# Patient Record
Sex: Female | Born: 1945 | Race: White | Hispanic: No | Marital: Married | Smoking: Never smoker
Health system: Southern US, Community
[De-identification: ages and names within clinical notes are randomized; demographics above are authoritative.]

## PROBLEM LIST (undated history)

## (undated) DIAGNOSIS — E785 Hyperlipidemia, unspecified: Secondary | ICD-10-CM

## (undated) DIAGNOSIS — L719 Rosacea, unspecified: Secondary | ICD-10-CM

## (undated) DIAGNOSIS — E042 Nontoxic multinodular goiter: Secondary | ICD-10-CM

## (undated) DIAGNOSIS — C801 Malignant (primary) neoplasm, unspecified: Secondary | ICD-10-CM

## (undated) DIAGNOSIS — T7840XA Allergy, unspecified, initial encounter: Secondary | ICD-10-CM

## (undated) DIAGNOSIS — L438 Other lichen planus: Secondary | ICD-10-CM

## (undated) DIAGNOSIS — Z0000 Encounter for general adult medical examination without abnormal findings: Secondary | ICD-10-CM

## (undated) DIAGNOSIS — R011 Cardiac murmur, unspecified: Secondary | ICD-10-CM

## (undated) DIAGNOSIS — C44310 Basal cell carcinoma of skin of unspecified parts of face: Secondary | ICD-10-CM

## (undated) DIAGNOSIS — I38 Endocarditis, valve unspecified: Secondary | ICD-10-CM

## (undated) DIAGNOSIS — Z8673 Personal history of transient ischemic attack (TIA), and cerebral infarction without residual deficits: Secondary | ICD-10-CM

## (undated) HISTORY — PX: CARDIAC VALVE REPLACEMENT: SHX585

## (undated) HISTORY — DX: Hyperlipidemia, unspecified: E78.5

## (undated) HISTORY — DX: Other lichen planus: L43.8

## (undated) HISTORY — DX: Basal cell carcinoma of skin of unspecified parts of face: C44.310

## (undated) HISTORY — PX: HERNIA REPAIR: SHX51

## (undated) HISTORY — DX: Malignant (primary) neoplasm, unspecified: C80.1

## (undated) HISTORY — DX: Allergy, unspecified, initial encounter: T78.40XA

## (undated) HISTORY — DX: Endocarditis, valve unspecified: I38

## (undated) HISTORY — DX: Personal history of transient ischemic attack (TIA), and cerebral infarction without residual deficits: Z86.73

## (undated) HISTORY — DX: Rosacea, unspecified: L71.9

## (undated) HISTORY — PX: PACEMAKER PLACEMENT: SHX43

## (undated) HISTORY — DX: Encounter for general adult medical examination without abnormal findings: Z00.00

## (undated) HISTORY — DX: Nontoxic multinodular goiter: E04.2

## (undated) HISTORY — DX: Cardiac murmur, unspecified: R01.1

## (undated) HISTORY — PX: MOHS SURGERY: SHX181

---

## 1999-02-17 ENCOUNTER — Encounter: Payer: Self-pay | Admitting: Family Medicine

## 1999-02-17 ENCOUNTER — Ambulatory Visit (HOSPITAL_COMMUNITY): Admission: RE | Admit: 1999-02-17 | Discharge: 1999-02-17 | Payer: Self-pay | Admitting: Family Medicine

## 2007-08-07 HISTORY — PX: ABDOMINAL HYSTERECTOMY: SHX81

## 2007-08-07 HISTORY — PX: COLECTOMY: SHX59

## 2010-05-24 ENCOUNTER — Emergency Department (HOSPITAL_BASED_OUTPATIENT_CLINIC_OR_DEPARTMENT_OTHER): Admission: EM | Admit: 2010-05-24 | Discharge: 2010-05-24 | Payer: Self-pay | Admitting: Emergency Medicine

## 2010-05-24 ENCOUNTER — Ambulatory Visit: Payer: Self-pay | Admitting: Diagnostic Radiology

## 2010-06-06 HISTORY — PX: VENTRAL HERNIA REPAIR: SHX424

## 2010-07-06 LAB — CONVERTED CEMR LAB

## 2010-09-01 ENCOUNTER — Encounter: Payer: Self-pay | Admitting: Family Medicine

## 2010-09-01 ENCOUNTER — Ambulatory Visit
Admission: RE | Admit: 2010-09-01 | Discharge: 2010-09-01 | Payer: Self-pay | Source: Home / Self Care | Attending: Family Medicine | Admitting: Family Medicine

## 2010-09-01 DIAGNOSIS — Z8679 Personal history of other diseases of the circulatory system: Secondary | ICD-10-CM | POA: Insufficient documentation

## 2010-09-01 DIAGNOSIS — I776 Arteritis, unspecified: Secondary | ICD-10-CM | POA: Insufficient documentation

## 2010-09-01 DIAGNOSIS — Z85038 Personal history of other malignant neoplasm of large intestine: Secondary | ICD-10-CM | POA: Insufficient documentation

## 2010-09-01 DIAGNOSIS — K439 Ventral hernia without obstruction or gangrene: Secondary | ICD-10-CM | POA: Insufficient documentation

## 2010-09-01 DIAGNOSIS — E663 Overweight: Secondary | ICD-10-CM | POA: Insufficient documentation

## 2010-09-01 DIAGNOSIS — C189 Malignant neoplasm of colon, unspecified: Secondary | ICD-10-CM | POA: Insufficient documentation

## 2010-09-01 DIAGNOSIS — N83209 Unspecified ovarian cyst, unspecified side: Secondary | ICD-10-CM | POA: Insufficient documentation

## 2010-09-01 DIAGNOSIS — I10 Essential (primary) hypertension: Secondary | ICD-10-CM | POA: Insufficient documentation

## 2010-09-01 DIAGNOSIS — M109 Gout, unspecified: Secondary | ICD-10-CM | POA: Insufficient documentation

## 2010-09-01 DIAGNOSIS — R209 Unspecified disturbances of skin sensation: Secondary | ICD-10-CM | POA: Insufficient documentation

## 2010-09-01 DIAGNOSIS — Z9189 Other specified personal risk factors, not elsewhere classified: Secondary | ICD-10-CM | POA: Insufficient documentation

## 2010-09-01 DIAGNOSIS — E1149 Type 2 diabetes mellitus with other diabetic neurological complication: Secondary | ICD-10-CM | POA: Insufficient documentation

## 2010-09-04 ENCOUNTER — Other Ambulatory Visit: Payer: Self-pay | Admitting: Family Medicine

## 2010-09-04 DIAGNOSIS — Z79899 Other long term (current) drug therapy: Secondary | ICD-10-CM

## 2010-09-04 DIAGNOSIS — Z780 Asymptomatic menopausal state: Secondary | ICD-10-CM

## 2010-09-07 ENCOUNTER — Telehealth: Payer: Self-pay | Admitting: Family Medicine

## 2010-09-11 ENCOUNTER — Encounter: Payer: Self-pay | Admitting: Family Medicine

## 2010-09-12 LAB — CONVERTED CEMR LAB
Albumin: 4.5 g/dL (ref 3.5–5.2)
CO2: 26 meq/L (ref 19–32)
Calcium: 9.9 mg/dL (ref 8.4–10.5)
Creatinine, Ser: 0.67 mg/dL (ref 0.40–1.20)
Folate: >20 ng/mL
HCT: 41.4 % (ref 36.0–46.0)
Indirect Bilirubin: 0.3 mg/dL (ref 0.0–0.9)
Iron: 58 ug/dL (ref 42–145)
MCHC: 32.1 g/dL (ref 30.0–36.0)
MCV: 84.3 fL (ref 78.0–100.0)
Platelets: 253 10*3/uL (ref 150–400)
Sodium: 142 meq/L (ref 135–145)
Total Bilirubin: 0.4 mg/dL (ref 0.3–1.2)
Total Protein: 7.3 g/dL (ref 6.0–8.3)
Vitamin B-12: 253 pg/mL (ref 211–911)
WBC: 7.9 10*3/uL (ref 4.0–10.5)

## 2010-09-13 ENCOUNTER — Ambulatory Visit
Admission: RE | Admit: 2010-09-13 | Discharge: 2010-09-13 | Disposition: A | Discharge status: Home / Self Care | Payer: Medicare Other | Source: Ambulatory Visit | Attending: Family Medicine | Admitting: Family Medicine

## 2010-09-13 DIAGNOSIS — Z780 Asymptomatic menopausal state: Secondary | ICD-10-CM

## 2010-09-13 DIAGNOSIS — Z79899 Other long term (current) drug therapy: Secondary | ICD-10-CM

## 2010-09-13 NOTE — Assessment & Plan Note (Signed)
Summary: MEDICARE WELLNESS CHECK/VFW   Vital Signs:  Patient profile:   65 year old female Menstrual status:  hysterectomy Height:      61 inches (154.94 cm) Weight:      167.75 pounds (76.25 kg) BMI:     31.81 O2 Sat:      99 % on Room air Temp:     97.8 degrees F (36.56 degrees C) oral Pulse rate:   82 / minute BP sitting:   145 / 82  (right arm)  Vitals Entered By: Josph Macho RMA (September 01, 2010 1:23 PM)  O2 Flow:  Room air  Serial Vital Signs/Assessments:  Time      Position  BP       Pulse  Resp  Temp     By                     132/82                         Danise Edge MD  CC: Establish new patient/ Medicare wellness/ CF Is Patient Diabetic? Yes     Menstrual Status hysterectomy Last PAP Result historical   History of Present Illness: she is a 65 year old Caucasian female in today to establish care. She is uneventful last couple of years with the diagnosis of colon cancer a in retrospect she had had some mild bowel changes and he developed lower quadrant pain after workup she was found to have a 10 pound ovarian cyst that was noncancerous and colon cancer as well. She was kept in [facility] for week and ultimately underwent surgery which resected both. She had a total hysterectomy as well as a partial colectomy although she is unclear how much colon was taken. Her complication process was complicated by abdominal wall hernias resulting in some bowel obstructions and pain. In November of 2011 check she went through hernia repair was done for hernias there is a very large piece of mesh covering her abdominal wall at this time. at this time her bowels are moving comfortably no bloody or tarry stool. She denies abdominal pain or any urinary complaints. In July of this year she also had an episode shingles on her face covered in her right jaw line. Was treated with Valtrex and the symptoms resolved about 3 weeks ago she started to notice some mild perioral numbness  says it comes and goes he has some similar symptoms during some of her chemotherapy treatments. Denies any numbness in the mouth any changes in diet recent illness, fevers, chills, nausea vomiting or excessive exercise. She does walk daily and carefully watches her diet. She has been in Weight Watchers since he hasn't had dropped from a high of 235 now down to 167 pounds and 12 ounces. Last hemoglobin A1c was 6.6 she follows with endocrinology who manages her labs. No recent illness, f, c, n, v, cong, CP, palp, SOB, GU c/o.   Preventive Screening-Counseling & Management  Alcohol-Tobacco     Smoking Status: never  Caffeine-Diet-Exercise     Does Patient Exercise: yes      Drug Use:  no.    Current Problems (verified): 1)  Arrhythmia, Hx of  (ICD-V12.50) 2)  Abdominal Wall Hernia  (ICD-553.20) 3)  Overweight  (ICD-278.02) 4)  Other and Unspecified Ovarian Cyst  (ICD-620.2) 5)  Essential Hypertension, Benign  (ICD-401.1) 6)  Colon Cancer  (ICD-153.9) 7)  Dm  (ICD-250.00) 8)  Vasculitis  (ICD-447.6)  Current Medications (verified): 1)  Metformin Hcl 500 Mg Tabs (Metformin Hcl) .Marland Kitchen.. 1 Three Times A Day 2)  Allopurinol 100 Mg Tabs (Allopurinol) .... Once Daily 3)  Hydrochlorothiazide 25 Mg Tabs (Hydrochlorothiazide) .... Once Daily 4)  Accupril 10 Mg Tabs (Quinapril Hcl) .... Once Daily 5)  Crestor 10 Mg Tabs (Rosuvastatin Calcium) .... 3 Per Week 6)  Aspir-Low 81 Mg Tbec (Aspirin) .... 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  The patient denies anorexia, fever, weight gain, vision loss, decreased hearing, hoarseness, chest pain, syncope, dyspnea on exertion, peripheral edema, prolonged cough, headaches, hemoptysis, abdominal pain, melena, hematochezia, severe indigestion/heartburn, hematuria, incontinence, genital sores, muscle weakness, suspicious skin lesions, transient blindness, difficulty walking, depression, unusual weight change, and enlarged lymph nodes.         Started Weight Watchers in 2010 at a hi of 235 pounds Other Physicians: Dr Talmage Nap @ [facility], Endocrinologist Dr Liana Crocker @ [facility], Oncology Dr Raeanne Barry @ [facility], Gastroenterology, colonoscopies, last done 2011, clear, repeat in 3 years Dr Noland Fordyce @ [facility], Gynecology Dr Lanice Schwab @ [facility], General Surgery Dr Junius Roads @ [facility], Dermatologist [facility] does her annual diabetic eye exam and she she reports it has been done in past year and it was WNL.  Patient denies any vision or hearing concerns or changes Patient denies any depression, anhedonia or hopelessness, no h/o same No falls and no difficulty with any ADLs including personal care, bills or cooking Patient denies any pain, does not feel unsafe. No urinary incontinence, no dietary restrictions Patient has recently filled out wills and advanced directives with her family and will bring them in to next visit. Takes medications as prescribed, does not take OTC meds No memory loss, no hazardous areas in home  Physical Exam  General:  Well-developed,well-nourished,in no acute distress;  alert,appropriate and cooperative throughout examination Head:  Normocephalic and atraumatic without obvious abnormalities. No apparent alopecia or balding. Eyes:  No corneal or conjunctival inflammation noted. EOMI. Perrla.  Ears:  External ear exam shows no significant lesions or deformities.  Otoscopic examination reveals clear canals, tympanic membranes are intact bilaterally without bulging, retraction, inflammation or discharge. Hearing is grossly normal bilaterally. Nose:  External nasal examination shows no deformity or inflammation. Nasal mucosa are pink and moist without lesions or exudates. Mouth:  Oral mucosa and oropharynx without lesions or exudates.  Teeth in good repair. Neck:  No deformities, masses, or tenderness noted. Lungs:  Normal respiratory effort, chest expands symmetrically. Lungs are clear to auscultation, no crackles or wheezes. Heart:  Normal rate and regular rhythm. S1 and S2 normal without gallop, murmur, click, rub or other extra sounds. Abdomen:  Bowel sounds positive,abdomen soft and non-tender without masses, organomegaly or hernias noted.  Extensive  vertical midline scar and numerous other small scars scattered throughout Msk:  No deformity or scoliosis noted of thoracic or lumbar spine.   Pulses:  R and L carotid,radial,dorsalis pedis and posterior tibial pulses are full and equal bilaterally Extremities:  No clubbing, cyanosis, edema, or deformity noted with normal full range of motion of all joints.   Neurologic:  No cranial nerve deficits noted. Station and gait are normal. Plantar reflexes are down-going bilaterally. DTRs are symmetrical throughout. Sensory, motor and coordinative functions appear intact. Skin:  Intact without suspicious lesions or rashes Cervical Nodes:  No lymphadenopathy noted Psych:  Cognition and judgment appear intact. Alert and cooperative with normal attention span and concentration. No apparent delusions, illusions,  hallucinations   Impression & Recommendations:  Problem # 1:  PREVENTIVE HEALTH CARE (ICD-V70.0)  Welcome to Medicare exam completed today. Patient agrees to Bone Densitometry, we will hold on carotid dopplers and AAA screen until her recent cardiac work up has been forwarded to [place] because she is unsure what has already been done. She will bring in copies of her Advanced Directives paperwork to her next visit for inclusion in her chart  Orders: Welcome to [employer], Physical [phone])  Problem # 2:  ESSENTIAL HYPERTENSION, BENIGN (ICD-401.1)  Her updated medication list for this problem includes:    Hydrochlorothiazide 25 Mg Tabs (Hydrochlorothiazide) ..... Once daily    Accupril 10 Mg Tabs (Quinapril hcl) ..... Once daily Improved control on repeat no change in therapy at today's visit. If she begins to experience difficulty with her gout we may need to d/c HCTZ  Problem # 3:  FACIAL PARESTHESIA (ICD-782.0) Unclear etiology will request lab work from other offices and patient will start Vitamin B complex and notify us if symptoms worsen  Problem # 4:  GOUT, UNSPECIFIED (ICD-274.9)  Her updated medication list for this problem includes:    Allopurinol 100 Mg Tabs (Allopurinol) ..... Once daily No flares in several years. maintain adequate hydration and avoid offending foods, await labs to evaluate uric acid level  Problem # 5:  ARRHYTHMIA, HX OF (ICD-V12.50) Normal rhythm today, requesting records from cardiac work up  Problem # 6:  DM (ICD-250.00)  Her updated medication list for this problem includes:    Metformin Hcl 500 Mg Tabs (Metformin hcl) .Marland Kitchen... 1 three times a day    Accupril 10 Mg Tabs (Quinapril hcl) ..... Once daily    Aspir-low 81 Mg Tbec (Aspirin) ..... Once daily HGBA1C of 6.6 and 6.3 over paset 2 blood draws per patient, will obtain records to help monitor with patient.  Problem # 7:  OVERWEIGHT (ICD-278.02) Will continue with weight watchers and maintain activity  level. Encouraged to avoid all trans fats   Complete Medication List: 1)  Metformin Hcl 500 Mg Tabs (Metformin hcl) .Marland Kitchen.. 1 three times a day 2)  Allopurinol 100 Mg Tabs (Allopurinol) .... Once daily 3)  Hydrochlorothiazide 25 Mg Tabs (Hydrochlorothiazide) .... Once daily 4)  Accupril 10 Mg Tabs (Quinapril hcl) .... Once daily 5)  Crestor 10 Mg Tabs (Rosuvastatin calcium) .... 3 per week 6)  Aspir-low 81 Mg Tbec (Aspirin) .... Once daily  Other Orders: Radiology Referral (Radiology)  Patient Instructions: 1)  Please schedule a follow-up appointment in 1 month.  2)  Release of records Dr Talmage Nap 2009 to present 3)  Start a Vitamin B complex 4)  Consider a Tdap and Zostavax at next visit 5)  Minimize sodium  6)  Consider supplying [place] with copies of DNR and  advanced directives   Orders Added: 1)  Welcome to Medicare, Physical [G0402] 2)  Radiology Referral [Radiology] 3)  New Patient Level III [04540]    Preventive Care Screening  Mammogram:    Date:  08/06/2010    Results:  historical   Pap Smear:    Date:  07/06/2010    Results:  historical   Last Pneumovax:    Date:  04/06/2010    Results:  historical   Last Flu Shot:    Date:  04/06/2010    Results:  historical   Colonoscopy:    Date:  08/07/2007    Results:  historical

## 2010-09-14 ENCOUNTER — Encounter: Payer: Self-pay | Admitting: *Deleted

## 2010-09-21 NOTE — Progress Notes (Signed)
Summary: Vitamin test  Phone Note Call from Patient Call back at Home Phone [phone]   Summary of Call: pt is requesting to have vitamin deficiency test to be done, she does not want to wait until her March appt Initial call taken by: Lannette Donath,  September 07, 2010 2:59 PM  Follow-up for Phone Call        can runvitamin d level, vitamin b12 level, folic acid and thiamine levels for paresthesias. Can run a renal and CBC for DM. Where does she want to do it? Follow-up by: Danise Edge MD,  September 08, 2010 1:05 PM  Additional Follow-up for Phone Call Additional follow up Details #1::        Left message with spouse to have pt return my call Additional Follow-up by: Josph Macho RMA,  September 08, 2010 4:20 PM    Additional Follow-up for Phone Call Additional follow up Details #2::    Patient would like to have her labs done at the [facility] [address]. pt would also like to have zinc and magnesium checked Follow-up by: Josph Macho RMA,  September 08, 2010 4:31 PM  Additional Follow-up for Phone Call Additional follow up Details #3:: Details for Additional Follow-up Action Taken: Labs are orderedd Additional Follow-up by: Danise Edge MD,  September 11, 2010 8:23 AM

## 2010-10-13 ENCOUNTER — Ambulatory Visit: Payer: Self-pay | Admitting: Family Medicine

## 2010-10-18 ENCOUNTER — Ambulatory Visit (INDEPENDENT_AMBULATORY_CARE_PROVIDER_SITE_OTHER): Payer: Medicare Other | Admitting: Family Medicine

## 2010-10-18 ENCOUNTER — Encounter: Payer: Self-pay | Admitting: Family Medicine

## 2010-10-18 DIAGNOSIS — E119 Type 2 diabetes mellitus without complications: Secondary | ICD-10-CM

## 2010-10-18 DIAGNOSIS — M109 Gout, unspecified: Secondary | ICD-10-CM

## 2010-10-18 DIAGNOSIS — I10 Essential (primary) hypertension: Secondary | ICD-10-CM

## 2010-10-18 DIAGNOSIS — R209 Unspecified disturbances of skin sensation: Secondary | ICD-10-CM

## 2010-10-18 LAB — POCT CARDIAC MARKERS
CKMB, poc: 2.6 ng/mL (ref 1.0–8.0)
Myoglobin, poc: 87.2 ng/mL (ref 12–200)

## 2010-10-18 LAB — URINE MICROSCOPIC-ADD ON

## 2010-10-18 LAB — COMPREHENSIVE METABOLIC PANEL
BUN: 20 mg/dL (ref 6–23)
CO2: 29 mEq/L (ref 19–32)
Calcium: 9.5 mg/dL (ref 8.4–10.5)
Creatinine, Ser: 0.8 mg/dL (ref 0.4–1.2)
GFR calc non Af Amer: >60 mL/min (ref >60)
Glucose, Bld: 128 mg/dL — ABNORMAL HIGH (ref 70–99)
Total Protein: 8.6 g/dL — ABNORMAL HIGH (ref 6.0–8.3)

## 2010-10-18 LAB — URINALYSIS, ROUTINE W REFLEX MICROSCOPIC
Bilirubin Urine: NEGATIVE
Glucose, UA: NEGATIVE mg/dL
Ketones, ur: 15 mg/dL — AB
Leukocytes, UA: NEGATIVE
Nitrite: NEGATIVE
Protein, ur: NEGATIVE mg/dL
Specific Gravity, Urine: 1.026 (ref 1.005–1.030)
Urobilinogen, UA: 1 mg/dL (ref 0.0–1.0)
pH: 6 (ref 5.0–8.0)

## 2010-10-18 LAB — DIFFERENTIAL
Eosinophils Absolute: 0 10*3/uL (ref 0.0–0.7)
Lymphs Abs: 2 10*3/uL (ref 0.7–4.0)
Monocytes Relative: 4 % (ref 3–12)
Neutro Abs: 8.9 10*3/uL — ABNORMAL HIGH (ref 1.7–7.7)
Neutrophils Relative %: 78 % — ABNORMAL HIGH (ref 43–77)

## 2010-10-18 LAB — CBC
HCT: 45.9 % (ref 36.0–46.0)
Hemoglobin: 15.9 g/dL — ABNORMAL HIGH (ref 12.0–15.0)
MCH: 30.1 pg (ref 26.0–34.0)
MCHC: 34.7 g/dL (ref 30.0–36.0)
MCV: 86.9 fL (ref 78.0–100.0)
RDW: 13.4 % (ref 11.5–15.5)

## 2010-10-18 LAB — URINE CULTURE: Colony Count: 45000

## 2010-10-18 LAB — LIPASE, BLOOD: Lipase: 43 U/L (ref 23–300)

## 2010-10-19 ENCOUNTER — Telehealth (INDEPENDENT_AMBULATORY_CARE_PROVIDER_SITE_OTHER): Payer: Self-pay | Admitting: *Deleted

## 2010-10-22 ENCOUNTER — Encounter: Payer: Self-pay | Admitting: Family Medicine

## 2010-10-24 NOTE — Assessment & Plan Note (Signed)
Summary: f/u 1 month appt/with dr. Abner Greenspan   Vital Signs:  Patient profile:   65 year old female Menstrual status:  hysterectomy Height:      61 inches (154.94 cm) Weight:      166.50 pounds (75.68 kg) O2 Sat:      99 % on Room air Temp:     97.9 degrees F (36.61 degrees C) oral Pulse rate:   65 / minute BP sitting:   129 / 76  (right arm)  Vitals Entered By: Josph Macho RMA (October 18, 2010 12:58 PM)  O2 Flow:  Room air  Serial Vital Signs/Assessments:  Time      Position  BP       Pulse  Resp  Temp     By                     118/78                         Danise Edge MD  CC: 1 month follow up/ CF Is Patient Diabetic? Yes   History of Present Illness:  patient is a 65 year old Caucasian female who is in today for followup. she was seen as a new patient her last visit and was struggling with some decreased taste some perioral numbness tingling discomfort she was started on a B. vitamin and that did help some. Then after reading some more on the internet she decided to stop her metformin 2 weeks ago. She reports stopping the metformin has helped her symptoms significantly and she is much less uncomfortable. She does see an endocrinologist had some labs on him and is restarted at 22 units each bedtime and her blood sugars in the morning have ranged from 98-110. As she has stayed off the Metformin, no other acute or recent complaints. No recent illness, fevers, chills, chest pain, palpitations, shortness of breath for numbness and tingling continue improving her taste has improved as well.  Current Medications (verified): 1)  Metformin Hcl 500 Mg Tabs (Metformin Hcl) .Marland Kitchen.. 1 Three Times A Day 2)  Allopurinol 100 Mg Tabs (Allopurinol) .... Once Daily 3)  Hydrochlorothiazide 25 Mg Tabs (Hydrochlorothiazide) .... Once Daily 4)  Accupril 10 Mg Tabs (Quinapril Hcl) .... Once Daily 5)  Crestor 10 Mg Tabs (Rosuvastatin Calcium) .... 3 Per Week 6)  Aspir-Low 81 Mg Tbec (Aspirin) .... Once  Daily  Allergies (verified): No Known Drug Allergies  Past History:  Past medical history reviewed for relevance to current acute and chronic problems. Social history (including risk factors) reviewed for relevance to current acute and chronic problems.  Social History: Reviewed history from 09/01/2010 and no changes required. 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 No apparent alopecia or balding. Mouth:  Oral mucosa and oropharynx without lesions or exudates.  Teeth in good repair. Neck:  No deformities, masses, or tenderness noted. Lungs:  Normal respiratory effort, chest expands symmetrically. Lungs are clear to auscultation, no crackles or wheezes. Heart:  Normal rate and regular rhythm. S1 and S2 normal without gallop, murmur, click, rub or other extra sounds. Abdomen:  Bowel sounds positive,abdomen soft and non-tender without masses, organomegaly or  hernias noted. Extremities:  No clubbing, cyanosis, edema, or deformity noted with normal full range of motion of all joints.   Psych:  Cognition and judgment appear intact. Alert and cooperative with normal attention span and concentration. No apparent delusions, illusions, hallucinations   Impression & Recommendations:  Problem # 1:  FACIAL PARESTHESIA (ICD-782.0) Improving off of Metformin and on Vitamin B1 will continue same for now and patient will call if symptoms worsen again for further evaluation and possible referral to Neurology  Problem # 2:  ESSENTIAL HYPERTENSION, BENIGN (ICD-401.1)  Her updated medication list for this problem  includes:    Hydrochlorothiazide 25 Mg Tabs (Hydrochlorothiazide) ..... Once daily    Accupril 10 Mg Tabs (Quinapril hcl) ..... Once daily Well controlled no changes  Problem # 3:  DM (ICD-250.00)  The following medications were removed from the medication list:    Metformin Hcl 500 Mg Tabs (Metformin hcl) .Marland Kitchen... 1 three times a day Her updated medication list for this problem includes:    Accupril 10 Mg Tabs (Quinapril hcl) ..... Once daily    Aspir-low 81 Mg Tbec (Aspirin) ..... Once daily    Lantus Solostar 100 Unit/ml Soln (Insulin glargine) .Marland Kitchen... 22 units subcutaneously at bedtime Patient reports improvement in sensation of taste and decreased oral tingling and numbness since stopping Metformin and blood sugars in am ranging from 98 to 110. Will cont Lantus at same dosing until she sees her Endocrinologist again  Problem # 4:  GOUT, UNSPECIFIED (ICD-274.9)  Her updated medication list for this problem includes:    Allopurinol 100 Mg Tabs (Allopurinol) ..... Once daily Patient has not had a flare since she was acutely ill. She may try and titrate off Allopurinol and maintain adequate hydration and see if she has any flares  Complete Medication List: 1)  Allopurinol 100 Mg Tabs (Allopurinol) .... Once daily 2)  Hydrochlorothiazide 25 Mg Tabs (Hydrochlorothiazide) .... Once daily 3)  Accupril 10 Mg Tabs (Quinapril hcl) .... Once daily 4)  Crestor 10 Mg Tabs (Rosuvastatin calcium) .... 3 per week 5)  Aspir-low 81 Mg Tbec (Aspirin) .... Once daily 6)  Lantus Solostar 100 Unit/ml Soln (Insulin glargine) .... 22 units subcutaneously at bedtime  Patient Instructions: 1)  Release of Records [facility] Dr Sharlynn Oliphant? October 2011 all notes, testing and labs 2)  Release of Records Dr Talmage Nap, B 3)  Release of Records, [facility], Dr Wilburn Mylar 4)  Release of Records, [facility] 5)  Please schedule a follow-up appointment in 6 to 8  months .  6)  64 oz clear fluids, extra  if drinking caffeine   Orders Added: 1)  Est. Patient Level IV [11914]

## 2010-10-24 NOTE — Letter (Signed)
Summary: Records Dated 1-10 thru 1-12/[facility]  Records Dated 1-10 thru 1-12/[facility]   Imported By: Lanelle Bal 10/18/2010 13:56:21  _____________________________________________________________________  External Attachment:    Type:   Image     Comment:   External Document

## 2010-10-24 NOTE — Progress Notes (Signed)
  Phone Note From Other Clinic   Summary of Call: Andrea Calderon is calling stating they need an ICD9 code for Vitamin D, iron, folic serum test? Archie [phone] Initial call taken by: Josph Macho RMA,  October 19, 2010 8:11 AM  Follow-up for Phone Call        colon cancer for vitamin d and anemia, 153.9,  Follow-up by: Danise Edge MD,  October 19, 2010 12:37 PM  Additional Follow-up for Phone Call Additional follow up Details #1::        Informed [facility] Additional Follow-up by: Josph Macho RMA,  October 20, 2010 10:16 AM

## 2010-12-20 ENCOUNTER — Encounter: Payer: Self-pay | Admitting: Family Medicine

## 2011-09-10 ENCOUNTER — Encounter: Payer: Self-pay | Admitting: Family Medicine

## 2011-09-10 ENCOUNTER — Ambulatory Visit (INDEPENDENT_AMBULATORY_CARE_PROVIDER_SITE_OTHER): Payer: Medicare Other | Admitting: Family Medicine

## 2011-09-10 DIAGNOSIS — L280 Lichen simplex chronicus: Secondary | ICD-10-CM

## 2011-09-10 DIAGNOSIS — I10 Essential (primary) hypertension: Secondary | ICD-10-CM

## 2011-09-10 DIAGNOSIS — I639 Cerebral infarction, unspecified: Secondary | ICD-10-CM

## 2011-09-10 DIAGNOSIS — L438 Other lichen planus: Secondary | ICD-10-CM

## 2011-09-10 DIAGNOSIS — I6340 Cerebral infarction due to embolism of unspecified cerebral artery: Secondary | ICD-10-CM

## 2011-09-10 DIAGNOSIS — I6330 Cerebral infarction due to thrombosis of unspecified cerebral artery: Secondary | ICD-10-CM

## 2011-09-10 NOTE — Patient Instructions (Addendum)
Stroke (Cerebrovascular Accident) A stroke is the sudden death of brain tissue. It is a medical emergency. A stroke can cause permanent loss of brain function. This can cause problems with different parts of your body. A TIA (transient ischemic attack) is different because it does not cause permanent damage. A TIA is a short-lived problem of poor blood flow affecting a part of the nervous system. TIA is also a serious problem because having a TIA greatly increases the chances of having a stroke. When symptoms first develop, you cannot know if the problem might be a stroke or TIA. CAUSES  A stroke is caused by a decrease of oxygen supply to an area of your brain. It is usually the result of a small blood clot or the arteries hardening. A stroke can also be caused by blocked or damaged carotid arteries. Bleeding in the brain can cause, or accompany, a stroke. RISK FACTORS  High blood pressure (hypertension).   High cholesterol.   Diabetes.   Heart disease.   The buildup of fatty deposits in the blood vessels (peripheral artery disease or atherosclerosis).   An abnormal heart rhythm (atrial fibrillation).   Obesity.   Smoking.   Taking oral contraceptives (especially in combination with smoking).   Physical inactivity.   A diet high in fats, salt (sodium), and calories.   Alcohol use.   Use of illegal drugs (especially cocaine and methamphetamine).   Being a female.   Being an African American.   Age over 55.   Family history of stroke.   Previous history of blood clots, a "warning stroke" (transient ischemic attack, TIA), or heart attack.   Sickle cell disease.  SYMPTOMS  These symptoms usually develop suddenly (or may be newly present upon awakening from sleep):  Sudden weakness or numbness of the face, arm, or leg, especially on one side of the body.   Sudden confusion.   Trouble speaking (aphasia) or understanding.   Sudden trouble seeing in one or both eyes.    Sudden trouble walking.   Dizziness.   Loss of balance or coordination.   Sudden severe headache with no known cause.  DIAGNOSIS  Your caregiver can often determine the presence or absence of a stroke based on your symptoms, history, and examination. A computerized X-ray scan (CT or CAT scan) of the brain is usually performed to confirm the stroke, to look for causes, and to determine the severity. Other tests may be done to find the cause of the stroke. These tests may include:  An EKG and heart monitoring.   An ultrasound evaluation of the heart (echocardiogram).   An ultrasound evaluation of your carotid arteries.   A computerized magnetic scan (MRI).   A scan of the brain circulation.   Blood oxygen level monitoring.   Blood tests.  PREVENTION  The risk of a stroke can be decreased by appropriately treating high blood pressure, high cholesterol, diabetes, heart disease, and obesity and by quitting smoking, limiting alcohol, and staying physically active. TREATMENT  TIME IS OF THE ESSENCE! It is important to seek treatment within 4 hours of the start of symptoms because you may receive a "clot dissolving" medication that cannot be given after that time. Even if you don't know when your symptoms began, get treatment as soon as possible. After the 4 hour window has passed, treatment may include rest, oxygen, intravenous (IV) fluids, and medicines to thin the blood (to prevent another stroke). Treatment of stroke depends on the duration, severity, and   cause of your symptoms. Medicines and diet may be used to address diabetes, high blood pressure, and other risk factors. Physical, speech, and occupational therapists will assess you and work to improve any functions impaired by the stroke. Measures will be taken to prevent short-term and long-term complications, including infection from breathing foreign material into the lungs (aspiration pneumonia), blood clots in the legs, bedsores,  and falls. Rarely, surgery may be needed to remove large blood clots or to open up blocked arteries. HOME CARE INSTRUCTIONS   Medicines: Aspirin and blood thinners may be used to prevent another stroke. Blood thinners need to be used exactly as instructed. Medicines may also be used to control risk factors for a stroke. Be sure you understand all your medicine instructions.   Diet: Certain diets may be prescribed to address high blood pressure, high cholesterol, diabetes, or obesity.   A low salt (sodium), low saturated fat, low trans fat, low cholesterol diet is recommended to manage high blood pressure.   A low saturated fat, low trans fat, low cholesterol, and high fiber diet may control cholesterol levels.   A controlled carbohydrate, controlled sugar diet is recommended to manage diabetes.   A reduced calorie, low sodium, low saturated fat, low trans fat, low cholesterol diet is recommended to manage obesity.   A diet that includes 5 or more servings of fruits and vegetables a day may reduce the risk of stroke. Foods may need to be a special consistency (soft or pureed), or small bites may need to be taken in order to avoid aspirating or choking.   Maintain a healthy weight.   Stay physically active. It is recommended that you get at least 30 minutes of activity on most or all days.   Do not smoke.   Limit alcohol use. Moderate alcohol use is considered to be:   No more than 2 drinks per day for men.   No more than 1 drink per day for nonpregnant women.   Stop drug abuse.   Home safety: A safe home environment is important to reduce the risk of falls. Your caregiver may arrange for specialists to evaluate your home. Having grab bars in the bedroom and bathroom is often important. Your caregiver may arrange for special equipment to be used at home, such as raised toilets and a seat for the shower.   Physical, occupational, and speech therapy: Ongoing therapy may be needed to  maximize your recovery after a stroke. If you have been advised to use a walker or a cane, use it at all times. Be sure to keep your therapy appointments.   Follow all instructions for follow-up with your caregiver. This is VERY important. This includes any referrals, physical therapy, rehabilitation, and laboratory tests. Proper treatment also prevents another stroke from occurring.  SEEK IMMEDIATE MEDICAL CARE IF:   You have sudden weakness or numbness of the face, arm, or leg, especially on one side of the body.   You have sudden confusion.   You have trouble speaking or understanding.   You have sudden trouble seeing in one or both eyes.   You have sudden trouble walking.   You have dizziness.   You have a loss of balance or coordination.   You have a sudden severe headache with no known cause.   You have a fever.   You are coughing or have difficulty breathing.   You have new chest pain, angina, or an irregular heartbeat.  ANY OF THESE SYMPTOMS MAY REPRESENT  A SERIOUS PROBLEM THAT IS AN EMERGENCY. Do not wait to see if the symptoms will go away. Get medical help right away. Call your local emergency services (911 in [place] DO NOT drive yourself to the hospital. Document Released: 07/23/2005 Document Revised: 04/04/2011 Document Reviewed: 03/12/2011 [facility] Patient Information 2012 [place], [place].   Coagulation Factors Coagulation factors are proteins that are essential for blood clot formation. Produced by the liver, the coagulation factors are released into the bloodstream when an injury occurs and are activated in a step by step process (coagulation cascade).  These are tests which are done when you have problems with clotting. They are also done to monitor effectiveness of anticoagulation therapy. Problems may be present at birth, the result of a disease, or effects of medications. PREPARATION FOR TEST A blood sample is obtained by inserting a needle into a vein in the  arm. NORMAL FINDINGS Factor / Normal Value (% of "Normal") II / 80-120  V / 50-150  VII / 65-140  VIII / 55-145  IX / 60-140  X / 45-155  XI / 65-135  XII / 50-150  Ranges for normal findings may vary among different laboratories and hospitals. You should always check with your doctor after having lab work or other tests done to discuss the meaning of your test results and whether your values are considered within normal limits. MEANING OF TEST  Your caregiver will go over the test results with you and discuss the importance and meaning of your results, as well as treatment options and the need for additional tests if necessary. OBTAINING THE TEST RESULTS It is your responsibility to obtain your test results. Ask the lab or department performing the test when and how you will get your results. Document Released: 08/23/2004 Document Revised: 04/04/2011 Document Reviewed: 07/01/2008 [facility] Patient Information 2012 [place], [place].

## 2011-09-11 ENCOUNTER — Other Ambulatory Visit: Payer: Medicare Other

## 2011-09-11 ENCOUNTER — Ambulatory Visit: Payer: Medicare Other

## 2011-09-11 ENCOUNTER — Encounter: Payer: Self-pay | Admitting: Family Medicine

## 2011-09-11 DIAGNOSIS — Z23 Encounter for immunization: Secondary | ICD-10-CM

## 2011-09-11 DIAGNOSIS — L438 Other lichen planus: Secondary | ICD-10-CM

## 2011-09-11 DIAGNOSIS — Z8673 Personal history of transient ischemic attack (TIA), and cerebral infarction without residual deficits: Secondary | ICD-10-CM | POA: Insufficient documentation

## 2011-09-11 HISTORY — DX: Personal history of transient ischemic attack (TIA), and cerebral infarction without residual deficits: Z86.73

## 2011-09-11 HISTORY — DX: Other lichen planus: L43.8

## 2011-09-11 LAB — CBC
HCT: 43.3 % (ref 36.0–46.0)
Hemoglobin: 14.5 g/dL (ref 12.0–15.0)
MCHC: 33.5 g/dL (ref 30.0–36.0)
MCV: 89.3 fl (ref 78.0–100.0)
Platelets: 220 10*3/uL (ref 150.0–400.0)
RDW: 14.6 % (ref 11.5–14.6)

## 2011-09-11 LAB — PROTIME-INR: Prothrombin Time: 10.2 s (ref 10.2–12.4)

## 2011-09-11 MED ORDER — TETANUS-DIPHTH-ACELL PERTUSSIS 5-2.5-18.5 LF-MCG/0.5 IM SUSP: 0.5000 mL | Freq: Once | INTRAMUSCULAR | Status: DC

## 2011-09-11 NOTE — Assessment & Plan Note (Signed)
Patient had sudden onset visual loss shortly after some blood work was done and was admitted to the hospital for a stroke. She has lost vision in the right eye, peripheral vision is black and the central visit is obscured by gray squiggly lines in this field of vision. Follows with a retinal specialist now, [facility]. Will begin to investigate her recurrent clotting with some clotting studies.

## 2011-09-11 NOTE — Assessment & Plan Note (Signed)
Was given a steroid gel by her dentist and has had a good improvement in her oral lesions since then

## 2011-09-11 NOTE — Progress Notes (Signed)
Patient ID: Andrea Calderon, female   DOB: 1946-06-25, 66 y.o.   MRN: [id number] ALNISA HASLEY [id number] 01/24/1946 09/11/2011      Progress Note-Follow Up  Subjective  Chief Complaint  Chief Complaint  Patient presents with  . Follow-up    HPI  Patient is an 35 cc total Caucasian female who is in today for a followup. In the fall she had some dental work done for an infected tooth and shortly thereafter had sudden onset visual loss and had a blood clot into her right eye vasculature. She's never fully recovered her vision and now falls with a retinal specialist. Her peripheral vision is black in the right eye and the central vision is obscured by grid lines. No headaches or other neurologic complaints. Her burning sensation in her toes or her mouth have improved with the steroid gel pad her oral surgeon or dentist gave her for lichen planus. No chest pain, palpitations, shortness of breath, fevers, GI or GU complaints. Her blood sugars have improved and she is now on oral metformin having come off of all insulin.  Past Medical History  Diagnosis Date  . Diabetes mellitus   . 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 Colectomy 2009  . Abdominal hysterectomy 2009    total  . Ventral hernia repair 11/11    4 lesions found, mesh left in place    Family History  Problem Relation Age of Onset  . Transient ischemic attack Mother   . Hypertension Mother   . Dementia Mother   . Diabetes Father     borderline sugar  . Coronary artery disease Father   . Heart disease Maternal Grandfather   . Heart disease Paternal Grandfather     History   Social History  . Marital Status: Married    Spouse Name: N/A    Number of Children: N/A  . Years of Education: N/A   Occupational History  . Not on file.   Social History Main Topics  . Smoking status: Never Smoker   . Smokeless tobacco: Never Used  . Alcohol Use: 0.0 oz/week    0 drink(s) per week   rare, social  . Drug Use: No  . Sexually Active: Not on file   Other Topics Concern  . Not on file   Social History Narrative  . No narrative on file    Current Outpatient Prescriptions on File Prior to Visit  Medication Sig Dispense Refill  . allopurinol (ZYLOPRIM) 100 MG tablet Take 100 mg by mouth daily.        Marland Kitchen aspirin 81 MG tablet Take 81 mg by mouth daily.        . hydrochlorothiazide 25 MG tablet Take 25 mg by mouth daily.        . metFORMIN (GLUCOPHAGE) 500 MG tablet Take 500 mg by mouth 2 (two) times daily with a meal.       . quinapril (ACCUPRIL) 10 MG tablet Take 10 mg by mouth daily.        . rosuvastatin (CRESTOR) 10 MG tablet Take 10 mg by mouth 3 (three) times a week.         No current facility-administered medications on file prior to visit.    Allergies  Allergen Reactions  . Codeine Nausea And Vomiting    Review of Systems  Review of Systems  Constitutional: Negative for fever, chills and malaise/fatigue.  HENT: Negative for hearing loss, nosebleeds  and congestion.   Eyes: Negative for discharge.       Right eye visual loss  Respiratory: Negative for cough, sputum production, shortness of breath and wheezing.   Cardiovascular: Negative for chest pain, palpitations and leg swelling.  Gastrointestinal: Negative for heartburn, nausea, vomiting, abdominal pain, diarrhea, constipation and blood in stool.  Genitourinary: Negative for dysuria, urgency, frequency and hematuria.  Musculoskeletal: Negative for myalgias, back pain and falls.  Skin: Negative for rash.  Neurological: Negative for dizziness, tremors, sensory change, focal weakness, loss of consciousness, weakness and headaches.  Endo/Heme/Allergies: Negative for polydipsia. Does not bruise/bleed easily.  Psychiatric/Behavioral: Negative for depression and suicidal ideas. The patient is not nervous/anxious and does not have insomnia.     Objective  BP 149/56  Pulse 77  Temp(Src) 98.4 F (36.9  C) (Temporal)  Ht 5\' 1"  (1.549 m)  Wt 154 lb 12.8 oz (70.217 kg)  BMI 29.25 kg/m2  SpO2 100%  Physical Exam  Physical Exam  Constitutional: She is oriented to person, place, and time and well-developed, well-nourished, and in no distress. No distress.  HENT:  Head: Normocephalic and atraumatic.  Eyes: Conjunctivae are normal.  Neck: Neck supple. No thyromegaly present.  Cardiovascular: Normal rate, regular rhythm and normal heart sounds.   No murmur heard. Pulmonary/Chest: Effort normal and breath sounds normal. She has no wheezes.  Abdominal: She exhibits no distension and no mass.  Musculoskeletal: She exhibits no edema.  Lymphadenopathy:    She has no cervical adenopathy.  Neurological: She is alert and oriented to person, place, and time.  Skin: Skin is warm and dry. No rash noted. She is not diaphoretic.  Psychiatric: Memory, affect and judgment normal.    No results found for this basename: TSH   Lab Results  Component Value Date   WBC 7.9 09/11/2010   HGB 13.3 09/11/2010   HCT 41.4 09/11/2010   MCV 84.3 09/11/2010   PLT 253 09/11/2010   Lab Results  Component Value Date   CREATININE 0.67 09/11/2010   BUN 14 09/11/2010   NA 142 09/11/2010   K 4.7 09/11/2010   CL 101 09/11/2010   CO2 26 09/11/2010   Lab Results  Component Value Date   ALT 13 09/11/2010   AST 21 09/11/2010   ALKPHOS 73 09/11/2010   BILITOT 0.4 09/11/2010     Assessment & Plan  Stroke, acute, thrombotic Patient had sudden onset visual loss shortly after some blood work was done and was admitted to the hospital for a stroke. She has lost vision in the right eye, peripheral vision is black and the central visit is obscured by gray squiggly lines in this field of vision. Follows with a retinal specialist now, [facility]. Will begin to investigate her recurrent clotting with some clotting studies.   Oral Lichen Planus Was given a steroid gel by her dentist and has had a good improvement in her oral lesions since  then

## 2011-09-12 LAB — HEPATIC FUNCTION PANEL
ALT: 20 U/L (ref 0–35)
AST: 23 U/L (ref 0–37)
Albumin: 4.1 g/dL (ref 3.5–5.2)
Total Protein: 7.4 g/dL (ref 6.0–8.3)

## 2011-09-12 LAB — RENAL FUNCTION PANEL
BUN: 15 mg/dL (ref 6–23)
CO2: 31 mEq/L (ref 19–32)
Calcium: 9.3 mg/dL (ref 8.4–10.5)
Creatinine, Ser: 0.7 mg/dL (ref 0.4–1.2)
Glucose, Bld: 91 mg/dL (ref 70–99)
Sodium: 139 mEq/L (ref 135–145)

## 2011-09-12 LAB — LIPID PANEL
Cholesterol: 251 mg/dL — ABNORMAL HIGH (ref 0–200)
Triglycerides: 114 mg/dL (ref 0.0–149.0)

## 2011-09-12 LAB — LDL CHOLESTEROL, DIRECT: Direct LDL: 182.4 mg/dL

## 2011-09-13 LAB — FACTOR 5 LEIDEN

## 2011-09-14 LAB — FACTOR 7 ASSAY: Factor VII Activity: 178 % — ABNORMAL HIGH (ref 60–150)

## 2011-09-17 LAB — VON WILLEBRAND FACTOR MULTIMER: Ristocetin Co-Factor: 184 % (ref 42–200)

## 2011-10-02 ENCOUNTER — Encounter: Payer: Self-pay | Admitting: Family Medicine

## 2011-10-02 MED ORDER — ROSUVASTATIN CALCIUM 10 MG PO TABS: 10.0000 mg | ORAL_TABLET | ORAL | Status: DC

## 2011-10-03 ENCOUNTER — Encounter: Payer: Self-pay | Admitting: Family Medicine

## 2011-10-07 ENCOUNTER — Encounter: Payer: Self-pay | Admitting: Family Medicine

## 2011-10-08 ENCOUNTER — Telehealth: Payer: Self-pay

## 2011-10-08 MED ORDER — ROSUVASTATIN CALCIUM 10 MG PO TABS: 10.0000 mg | ORAL_TABLET | Freq: Every day | ORAL | Status: DC

## 2011-10-08 NOTE — Telephone Encounter (Signed)
RX printed per patients request

## 2011-10-25 IMAGING — CT CT ABD-PELV W/ CM
2 of 5 series · 17 of 46 positions shown, 19 images · IV contrast (APPLIED)
Comparison: None.

CLINICAL DATA: Abdominal pain and nausea.  History of colon cancer.

CT ABDOMEN AND PELVIS WITH CONTRAST
TECHNIQUE: Multidetector CT imaging of the abdomen and pelvis was
performed following the standard protocol during bolus
administration of intravenous contrast.
Contrast: 100 ml [id number].

[Series 2: abd/pelvis 5.0 b31f · axial · 0.88mm/px · z∈[-354,+51]mm · 14 of 91 slices shown, 16 images]
[im 5/91  soft-tissue]
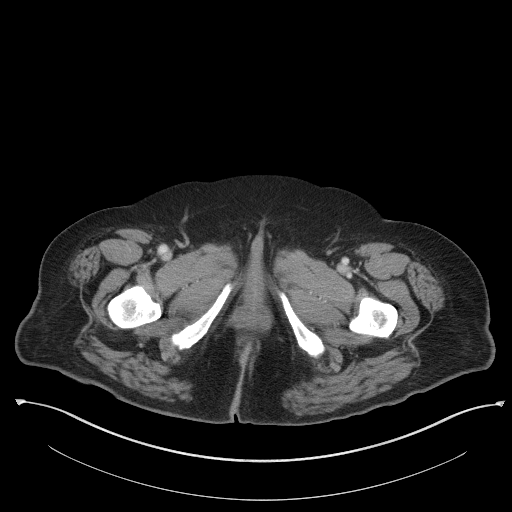
[im 5/91  bone]
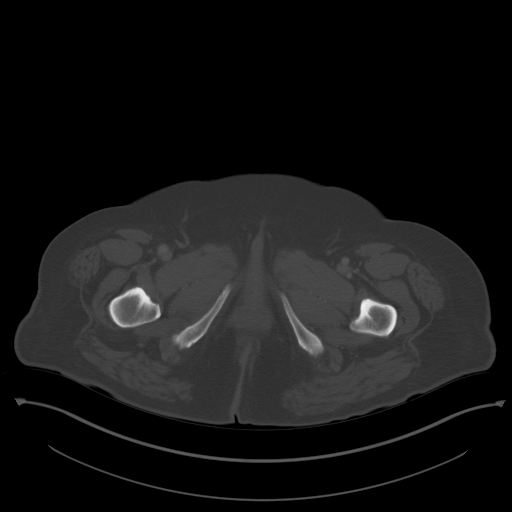
[im 14/91  soft-tissue]
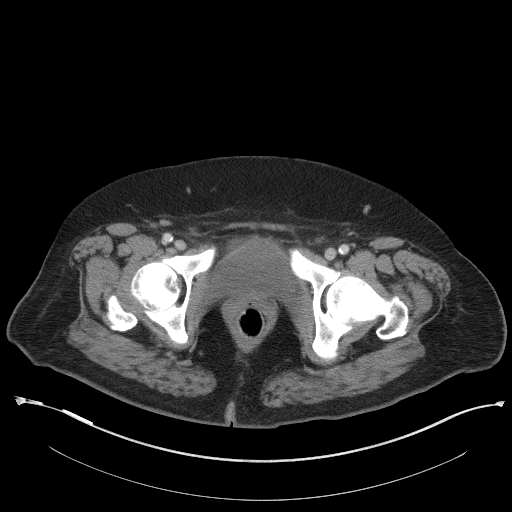
[im 19/91  soft-tissue]
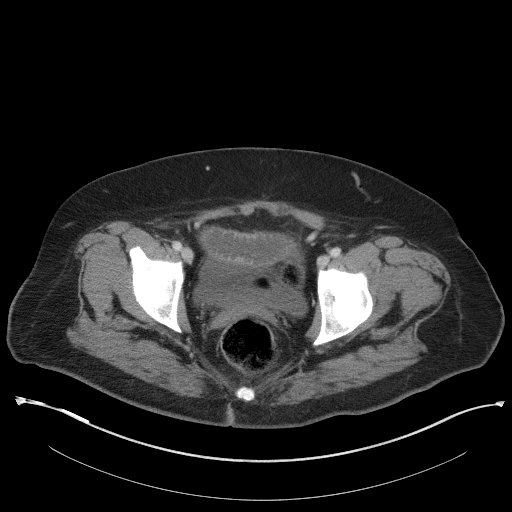
[im 23/91  soft-tissue]
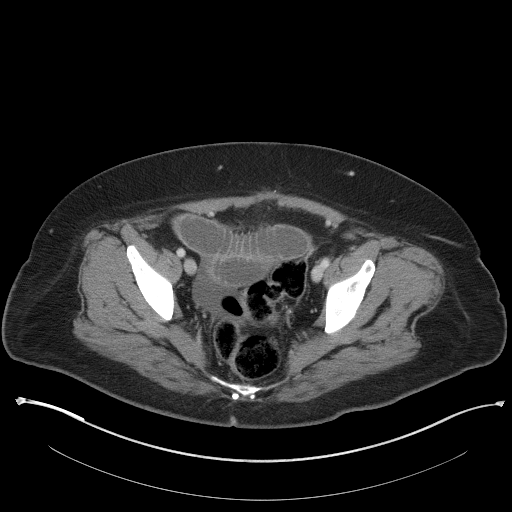
[im 32/91  soft-tissue]
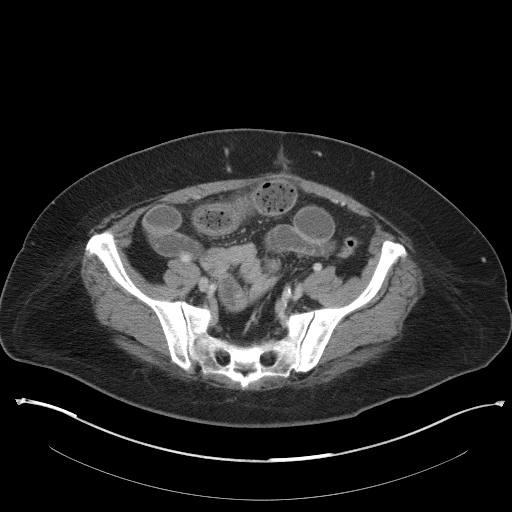
[im 37/91  soft-tissue]
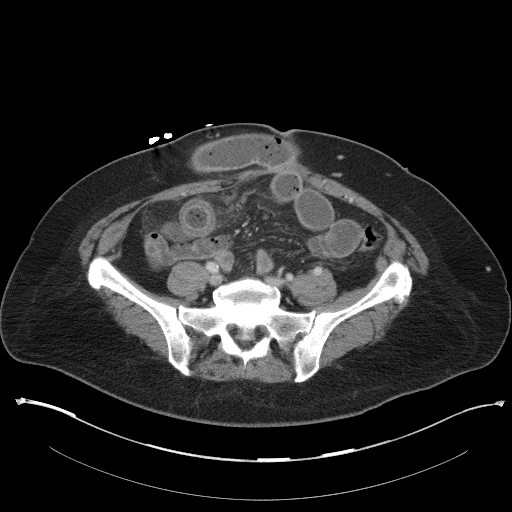
[im 41/91  soft-tissue]
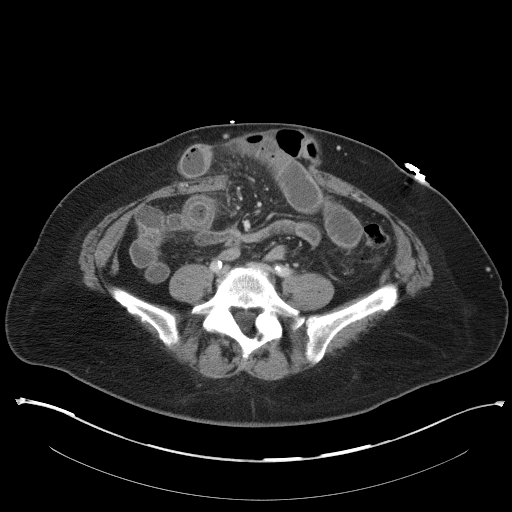
[im 50/91  soft-tissue]
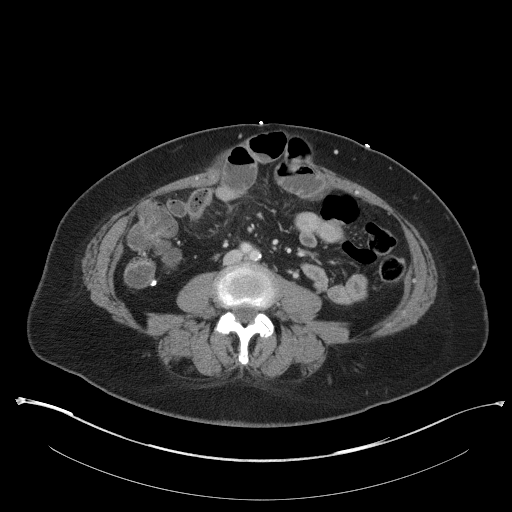
[im 55/91  soft-tissue]
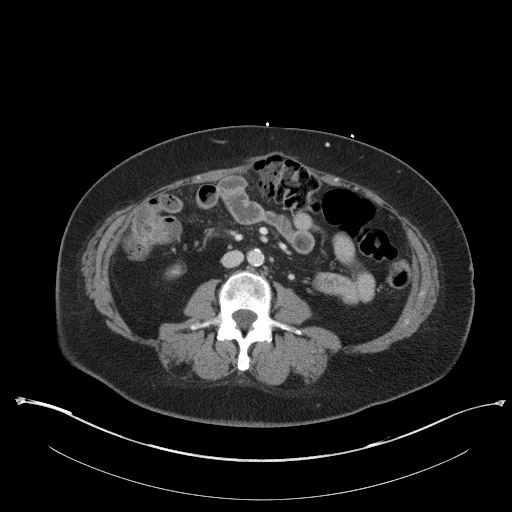
[im 55/91  bone]
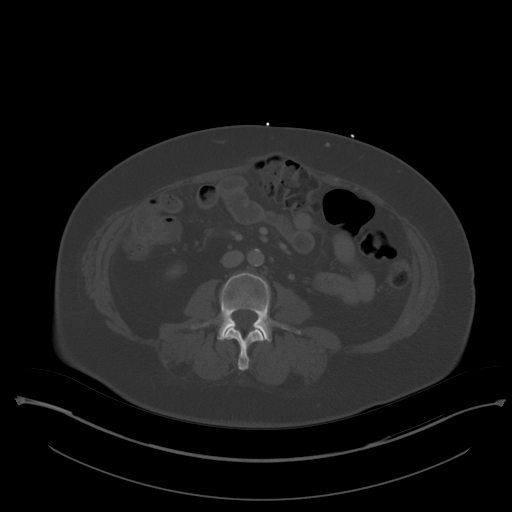
[im 59/91  soft-tissue]
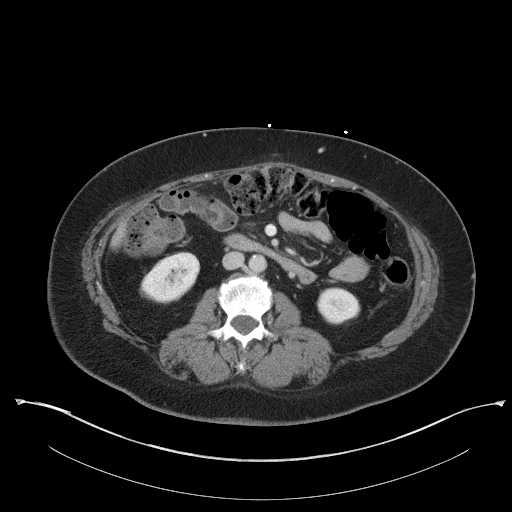
[im 68/91  soft-tissue]
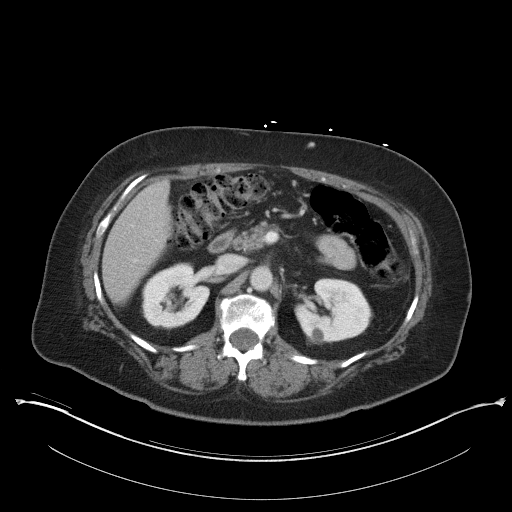
[im 73/91  soft-tissue]
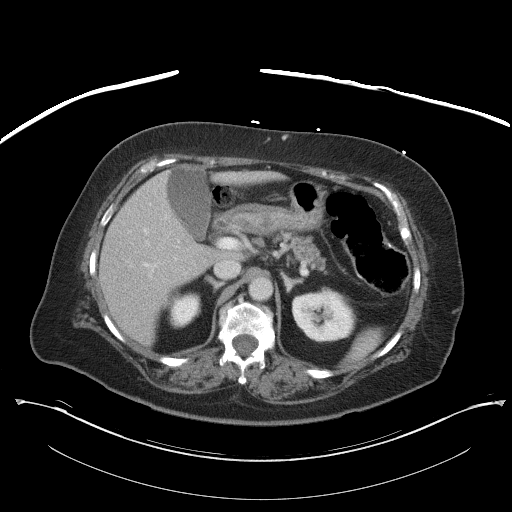
[im 77/91  soft-tissue]
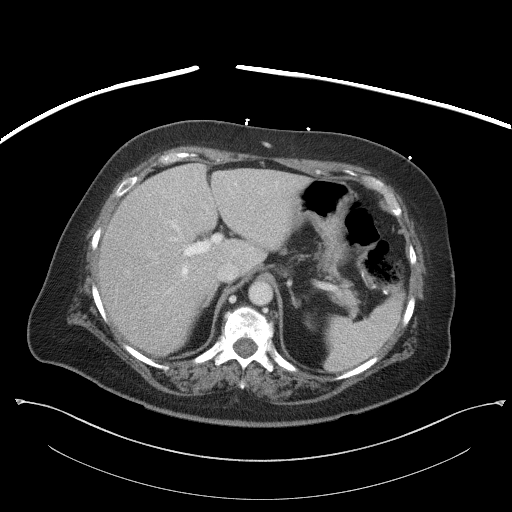
[im 86/91  soft-tissue]
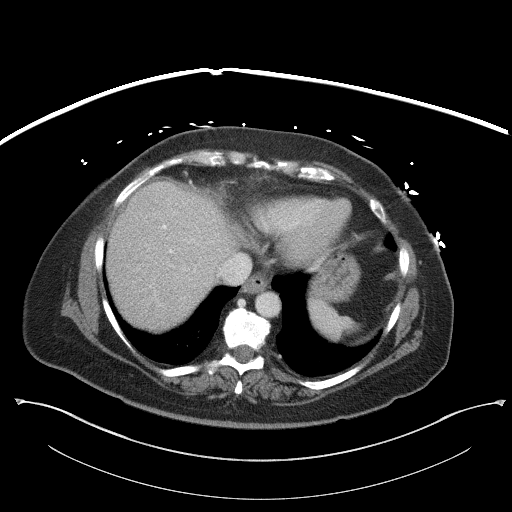

[Series 5: abd/pelvis 3.0 coronal · coronal · 0.91mm/px · 3 of 85 slices shown]
[im 29/85  soft-tissue]
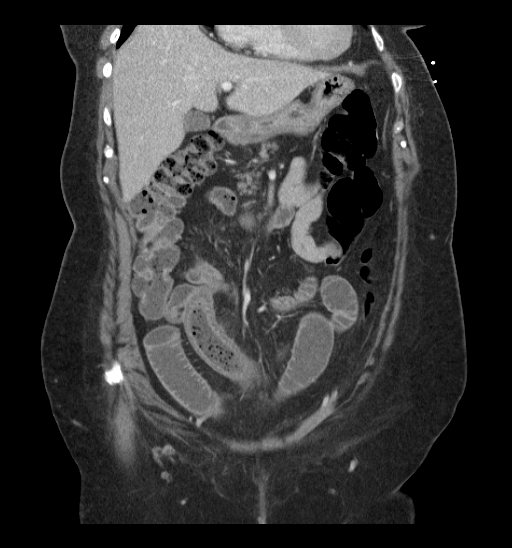
[im 38/85  soft-tissue]
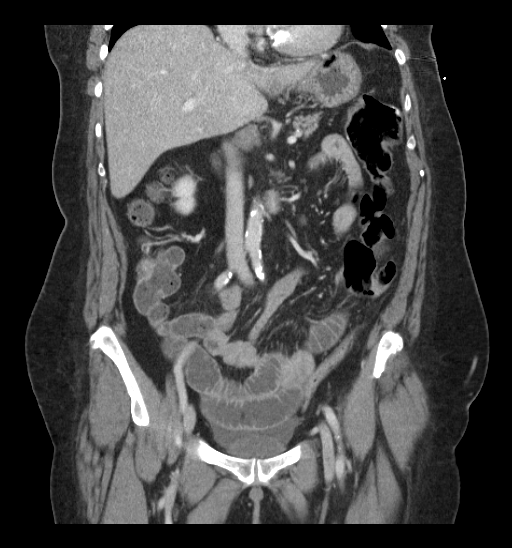
[im 47/85  soft-tissue]
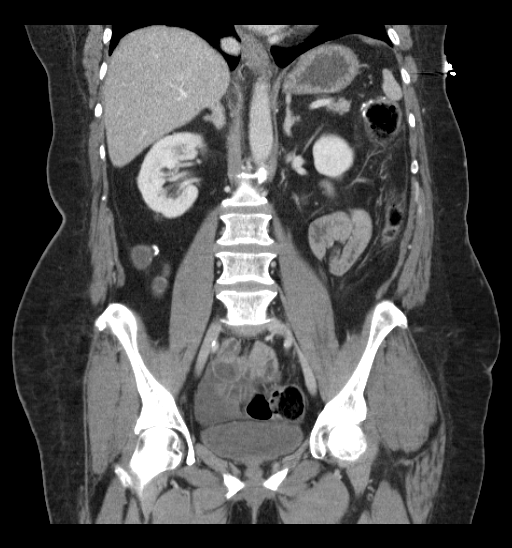

[17 of 46 positions shown; findings below may reference images not displayed]

FINDINGS: The lung bases demonstrate dependent atelectasis/edema.
No pleural effusion.  A hiatal hernia is noted.  Mitral valve
annular calcifications are noted.

The solid abdominal organs are unremarkable.  A small left renal
cyst is noted.

The stomach, duodenum and proximal small bowel are unremarkable.
There are distended distal small bowel loops with wall thickening,
submucosal edema and small bowels stool consistent with
obstruction.  There is an anterior abdominal wall hernia containing
small bowel loops and as a right loop enters the hernia tip appears
to kinked, narrowed and thickened.

The colon is unremarkable.  A small portion of the transverse colon
bulges into the anterior abdominal wall hernia.

The aorta is normal in caliber.  Mild atherosclerotic changes
distally.  No aneurysm.  The major branch vessels are patent.  No
mesenteric or retroperitoneal masses or adenopathy.

There are surgical changes from a hysterectomy.  The left ovary is
present and appears normal.  No pelvic mass or adenopathy.  There
is a small amount of free pelvic fluid.

The bony structures are unremarkable.  Mild degenerative changes
throughout the spine.  A unilateral pars defect is noted on the
left at L5.
IMPRESSION: CT findings consistent with a mid distal small bowel obstruction
likely due to combination of adhesions and anterior abdominal wall
hernia.

## 2011-12-06 ENCOUNTER — Ambulatory Visit (INDEPENDENT_AMBULATORY_CARE_PROVIDER_SITE_OTHER): Payer: Medicare Other | Admitting: Family Medicine

## 2011-12-06 ENCOUNTER — Encounter: Payer: Self-pay | Admitting: Family Medicine

## 2011-12-06 VITALS — BP 135/76 | HR 66 | Temp 98.0°F | Ht 61.0 in | Wt 152.4 lb

## 2011-12-06 DIAGNOSIS — IMO0001 Reserved for inherently not codable concepts without codable children: Secondary | ICD-10-CM

## 2011-12-06 DIAGNOSIS — E785 Hyperlipidemia, unspecified: Secondary | ICD-10-CM

## 2011-12-06 DIAGNOSIS — I6330 Cerebral infarction due to thrombosis of unspecified cerebral artery: Secondary | ICD-10-CM

## 2011-12-06 DIAGNOSIS — E663 Overweight: Secondary | ICD-10-CM

## 2011-12-06 DIAGNOSIS — I639 Cerebral infarction, unspecified: Secondary | ICD-10-CM

## 2011-12-06 DIAGNOSIS — C189 Malignant neoplasm of colon, unspecified: Secondary | ICD-10-CM

## 2011-12-06 DIAGNOSIS — I10 Essential (primary) hypertension: Secondary | ICD-10-CM

## 2011-12-06 DIAGNOSIS — E119 Type 2 diabetes mellitus without complications: Secondary | ICD-10-CM

## 2011-12-06 LAB — HEPATIC FUNCTION PANEL
ALT: 18 U/L (ref 0–35)
Albumin: 4.4 g/dL (ref 3.5–5.2)
Bilirubin, Direct: 0 mg/dL (ref 0.0–0.3)
Total Protein: 7.7 g/dL (ref 6.0–8.3)

## 2011-12-06 LAB — CBC
MCV: 88.4 fl (ref 78.0–100.0)
RBC: 4.78 Mil/uL (ref 3.87–5.11)

## 2011-12-06 LAB — LIPID PANEL
Cholesterol: 125 mg/dL (ref 0–200)
HDL: 56.1 mg/dL (ref >39.00)
Triglycerides: 125 mg/dL (ref 0.0–149.0)
VLDL: 25 mg/dL (ref 0.0–40.0)

## 2011-12-06 LAB — HEMOGLOBIN A1C: Hgb A1c MFr Bld: 6.5 % (ref 4.6–6.5)

## 2011-12-06 LAB — TSH: TSH: 2 u[IU]/mL (ref 0.35–5.50)

## 2011-12-06 LAB — RENAL FUNCTION PANEL
Calcium: 9.8 mg/dL (ref 8.4–10.5)
Chloride: 98 mEq/L (ref 96–112)
Glucose, Bld: 96 mg/dL (ref 70–99)
Phosphorus: 4 mg/dL (ref 2.3–4.6)
Potassium: 4.9 mEq/L (ref 3.5–5.1)
Sodium: 138 mEq/L (ref 135–145)

## 2011-12-06 NOTE — Progress Notes (Signed)
Patient ID: Andrea Calderon, female   DOB: 24-Jun-1946, 66 y.o.   MRN: [id number] Andrea Calderon [id number] July 29, 1946 12/06/2011      Progress Note-Follow Up  Subjective  Chief Complaint  Chief Complaint  Patient presents with  . Follow-up    discuss labs- pt fasting for lipids    HPI  Patient is a 66 year old Caucasian female who is in today for fasting labs and followup. Overall she's doing well. She had an episode of retinal artery occlusion back in October 2012 and has had no further clotting episodes. Blood work did show mildly increased factors 7 activity level and a slightly increased von Willebrand's but wishes 325 aspirin she's had no further events. She continues to lose weight and is pleased because she is now met her lifetime goal. She's lost 85 pounds in total. She had been taking her Crestor or 3 times a week for her are suggested increased to daily for her cholesterol levels trended up. She denies chest pain, palpitations, shortness of breath, GI or GU c/o  Past Medical History  Diagnosis Date  . Diabetes mellitus   . 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 Colectomy 2009  . Abdominal hysterectomy 2009    total  . Ventral hernia repair 11/11    4 lesions found, mesh left in place    Family History  Problem Relation Age of Onset  . Transient ischemic attack Mother   . Hypertension Mother   . Dementia Mother   . Diabetes Father     borderline sugar  . Coronary artery disease Father   . Heart disease Maternal Grandfather   . Heart disease Paternal Grandfather     History   Social History  . Marital Status: Married    Spouse Name: N/A    Number of Children: N/A  . Years of Education: N/A   Occupational History  . Not on file.   Social History Main Topics  . Smoking status: Never Smoker   . Smokeless tobacco: Never Used  . Alcohol Use: 0.0 oz/week    0 drink(s) per week     rare, social  . Drug Use: No  . Sexually  Active: Not on file   Other Topics Concern  . Not on file   Social History Narrative  . No narrative on file    Current Outpatient Prescriptions on File Prior to Visit  Medication Sig Dispense Refill  . aspirin 325 MG tablet Take 325 mg by mouth daily.      . B Complex Vitamins (VITAMIN-B COMPLEX PO) Take 1 tablet by mouth daily.      . fluocinonide gel (LIDEX) 0.05 % Apply 1 application topically as needed.      . hydrochlorothiazide 25 MG tablet Take 25 mg by mouth daily.        . metFORMIN (GLUCOPHAGE) 500 MG tablet Take 500 mg by mouth 2 (two) times daily with a meal.       . quinapril (ACCUPRIL) 10 MG tablet Take 10 mg by mouth daily.        . rosuvastatin (CRESTOR) 10 MG tablet Take 1 tablet (10 mg total) by mouth daily.  30 tablet  2    Allergies  Allergen Reactions  . Codeine Nausea And Vomiting    Review of Systems  Review of Systems  Constitutional: Negative for fever and malaise/fatigue.  HENT: Negative for congestion.   Eyes: Negative for discharge.  Respiratory:  Negative for shortness of breath.   Cardiovascular: Negative for chest pain, palpitations and leg swelling.  Gastrointestinal: Negative for nausea, abdominal pain and diarrhea.  Genitourinary: Negative for dysuria.  Musculoskeletal: Negative for falls.  Skin: Negative for rash.  Neurological: Negative for loss of consciousness and headaches.  Endo/Heme/Allergies: Negative for polydipsia.  Psychiatric/Behavioral: Negative for depression and suicidal ideas. The patient is not nervous/anxious and does not have insomnia.     Objective  BP 135/76  Pulse 66  Temp(Src) 98 F (36.7 C) (Temporal)  Ht 5\' 1"  (1.549 m)  Wt 152 lb 6.4 oz (69.128 kg)  BMI 28.80 kg/m2  SpO2 99%  Physical Exam  Physical Exam  Constitutional: She is oriented to person, place, and time and well-developed, well-nourished, and in no distress. No distress.  HENT:  Head: Normocephalic and atraumatic.  Right Ear: External ear  normal.  Left Ear: External ear normal.  Nose: Nose normal.  Mouth/Throat: Oropharynx is clear and moist. No oropharyngeal exudate.  Eyes: Conjunctivae are normal. Pupils are equal, round, and reactive to light. Right eye exhibits no discharge. Left eye exhibits no discharge. No scleral icterus.  Neck: Normal range of motion. Neck supple. No thyromegaly present.  Cardiovascular: Normal rate, regular rhythm, normal heart sounds and intact distal pulses.   No murmur heard. Pulmonary/Chest: Effort normal and breath sounds normal. No respiratory distress. She has no wheezes. She has no rales.  Abdominal: Soft. Bowel sounds are normal. She exhibits no distension and no mass. There is no tenderness.  Musculoskeletal: Normal range of motion. She exhibits no edema and no tenderness.  Lymphadenopathy:    She has no cervical adenopathy.  Neurological: She is alert and oriented to person, place, and time. She has normal reflexes. No cranial nerve deficit. Coordination normal.  Skin: Skin is warm and dry. No rash noted. She is not diaphoretic.  Psychiatric: Mood, memory and affect normal.    Lab Results  Component Value Date   TSH 2.00 12/06/2011   Lab Results  Component Value Date   WBC 7.4 12/06/2011   HGB 14.1 12/06/2011   HCT 42.2 12/06/2011   MCV 88.4 12/06/2011   PLT 197.0 12/06/2011   Lab Results  Component Value Date   CREATININE 0.7 12/06/2011   BUN 19 12/06/2011   NA 138 12/06/2011   K 4.9 12/06/2011   CL 98 12/06/2011   CO2 31 12/06/2011   Lab Results  Component Value Date   ALT 18 12/06/2011   AST 24 12/06/2011   ALKPHOS 60 12/06/2011   BILITOT 0.7 12/06/2011   Lab Results  Component Value Date   CHOL 125 12/06/2011   Lab Results  Component Value Date   HDL 56.10 12/06/2011   Lab Results  Component Value Date   LDLCALC 44 12/06/2011   Lab Results  Component Value Date   TRIG 125.0 12/06/2011   Lab Results  Component Value Date   CHOLHDL 2 12/06/2011     Assessment & Plan  COLON  CANCER Continues to follow with Heme/Pmce at [facility], has appt soon  Stroke, acute, thrombotic Patient continues to do well on ECASA 325 mg. Her blood work revealed some mild abnormalities with Factor VII activity and Von Willebrand's. We will forward these results to her hematologist given her history of thrombotic events  ESSENTIAL HYPERTENSION, BENIGN Well controlled no changes  DM Checked HGBA1C today, patient will take results to her Endocrinology appt. Avoid simple carbs  OVERWEIGHT Has just reached her lifetime goal with  weight watchers and lost 85#

## 2011-12-06 NOTE — Assessment & Plan Note (Signed)
Continues to follow with Heme/Pmce at [facility], has appt soon

## 2011-12-06 NOTE — Assessment & Plan Note (Signed)
Checked HGBA1C today, patient will take results to her Endocrinology appt. Avoid simple carbs

## 2011-12-06 NOTE — Assessment & Plan Note (Signed)
Well controlled no changes 

## 2011-12-06 NOTE — Assessment & Plan Note (Signed)
Has just reached her lifetime goal with weight watchers and lost 85#

## 2011-12-06 NOTE — Patient Instructions (Signed)

## 2011-12-06 NOTE — Assessment & Plan Note (Signed)
Patient continues to do well on ECASA 325 mg. Her blood work revealed some mild abnormalities with Factor VII activity and Von Willebrand's. We will forward these results to her hematologist given her history of thrombotic events

## 2012-01-17 ENCOUNTER — Other Ambulatory Visit: Payer: Self-pay

## 2012-01-17 ENCOUNTER — Telehealth: Payer: Self-pay

## 2012-01-17 MED ORDER — ROSUVASTATIN CALCIUM 10 MG PO TABS: 10.0000 mg | ORAL_TABLET | Freq: Every day | ORAL | Status: DC

## 2012-01-17 NOTE — Telephone Encounter (Signed)
RX for Crestor resent to pharmacy. [facility] [employer]

## 2012-04-08 ENCOUNTER — Other Ambulatory Visit: Payer: Self-pay

## 2012-04-08 MED ORDER — GLUCOSE BLOOD VI STRP: ORAL_STRIP | Status: DC

## 2012-04-08 MED ORDER — ROSUVASTATIN CALCIUM 10 MG PO TABS: 10.0000 mg | ORAL_TABLET | Freq: Every day | ORAL | Status: DC

## 2012-05-23 ENCOUNTER — Other Ambulatory Visit: Payer: Self-pay | Admitting: Endocrinology

## 2012-05-23 DIAGNOSIS — E049 Nontoxic goiter, unspecified: Secondary | ICD-10-CM

## 2012-06-10 ENCOUNTER — Ambulatory Visit
Admission: RE | Admit: 2012-06-10 | Discharge: 2012-06-10 | Disposition: A | Discharge status: Home / Self Care | Payer: Medicare Other | Source: Ambulatory Visit | Attending: Endocrinology | Admitting: Endocrinology

## 2012-06-10 DIAGNOSIS — E049 Nontoxic goiter, unspecified: Secondary | ICD-10-CM

## 2012-06-13 ENCOUNTER — Encounter: Payer: Self-pay | Admitting: Family Medicine

## 2012-06-13 ENCOUNTER — Ambulatory Visit (INDEPENDENT_AMBULATORY_CARE_PROVIDER_SITE_OTHER): Payer: Medicare Other | Admitting: Family Medicine

## 2012-06-13 VITALS — BP 119/78 | HR 64 | Temp 97.8°F | Ht 61.0 in | Wt 154.4 lb

## 2012-06-13 DIAGNOSIS — I10 Essential (primary) hypertension: Secondary | ICD-10-CM

## 2012-06-13 DIAGNOSIS — E785 Hyperlipidemia, unspecified: Secondary | ICD-10-CM

## 2012-06-13 DIAGNOSIS — I38 Endocarditis, valve unspecified: Secondary | ICD-10-CM

## 2012-06-13 DIAGNOSIS — Z23 Encounter for immunization: Secondary | ICD-10-CM

## 2012-06-13 DIAGNOSIS — E042 Nontoxic multinodular goiter: Secondary | ICD-10-CM

## 2012-06-13 DIAGNOSIS — E663 Overweight: Secondary | ICD-10-CM

## 2012-06-13 DIAGNOSIS — E119 Type 2 diabetes mellitus without complications: Secondary | ICD-10-CM

## 2012-06-13 HISTORY — DX: Endocarditis, valve unspecified: I38

## 2012-06-13 HISTORY — DX: Nontoxic multinodular goiter: E04.2

## 2012-06-13 MED ORDER — ZOSTER VACCINE LIVE 19400 UNT/0.65ML ~~LOC~~ SOLR: 0.6500 mL | Freq: Once | SUBCUTANEOUS | Status: DC

## 2012-06-13 NOTE — Assessment & Plan Note (Signed)
2 on right and 1 on left is awaiting final results. Will follow up with endocrinology

## 2012-06-13 NOTE — Patient Instructions (Addendum)
Mitral Valvular Regurgitation  Mitral valvular regurgitation (MVR, MR) is a condition in which there is a leaky mitral valve. The mitral valve is the large valve between the two left chambers of the heart. When the large muscular ventricle contracts to pump blood, the mitral valve keeps that blood from flowing backward and back into the atrium. If there is too much regurgitation, the heart has to work harder. This eventually can cause heart failure. When your heart goes into failure, you do not feel well. You have shortness of breath (dyspnea) with exertion. The kidneys do not work as well so you may retain fluid. This is one of the reasons your lower legs and ankles may swell. You may have a rapid weight gain. In addition to this swelling, the fluid retention makes fluid back up in the lungs. This causes additional shortness of breath, which then makes the failure worse.  The first sign you will usually recognize is shortness of breath with exertion (climbing stairs for example). You will also usually get a rapid heartbeat. Upon discharge from this location, weigh yourself after arriving home. Record your weight at the same time every day as this will provide a record of your progress. As you get better, your weight will usually go down. Follow a low sodium (low salt) diet. You may notice that you get short of breath while sleeping. The heart actually has to work harder while you are lying down. This may also produce a night cough or make it necessary to sleep with two or more pillows.  SYMPTOMS    You may have no symptoms if mitral regurgitation is mild. It may be discovered only during a routine exam by your caregiver when a heart murmur is heard.  DIAGNOSIS     The best study for mitral regurgitation is the echocardiogram (ultrasound of the heart). This shows the cause of the MR and how bad it is. It also gives information about the left ventricle and atrium (the two heart chambers that are bridged by the mitral valve.)  TREATMENT     Early MR may be treated with medications. If there are no medication allergies or problems, ACE Inhibitors are commonly used in the treatment of MR. Under treatment, these symptoms usually improve rapidly. Medications treat but will not cure or slow the progression of the MR. This is more dependent on the cause of the MR.   If MR becomes more severe, surgery may become necessary to repair or replace the valve. This is called open heart surgery.   There is no absolute age limitation to valve surgery. Eighty year old people have had their valves replaced. The risk of stroke and death is low with open heart surgery, but does increase a with age and other medical problems. Elderly patients that are otherwise healthy usually do well with valve replacement surgery.   A cardiac catheterization is usually done prior to valve surgery unless the patient is very young. This is done to check the health of coronary arteries. If there are blockages, a bypass can be done while the chest is open for the valve replacement.  If you are beginning to have symptoms from mitral regurgitation or are waiting for a surgical procedure to help you with this problem, following are some of the things you can do to help yourself while you are waiting for surgery or are simply putting off the surgery to see if it is needed.  HOME CARE INSTRUCTIONS        Activity Level--- Your caregiver will help you determine what type of exercise program may be helpful. It is important to maintain strength and increase it if possible. Pace your activities and avoid shortness of breath or chest pain. Plan activities for at least an hour after meals or before eating. This allows your body to handle one activity at a time. Your caregiver can help advise you for activities.   Diet--- Maintain a low salt diet or as directed by your caregiver and eat a heart healthy diet. Get diet information from your caregiver or dietician. Remove your salt shaker and avoid adding salt to you foods. Measure the amounts of fluids you take in per day in cups and record these amounts.   Discharge Medications--- You may have been prescribed an ACE inhibitor or a beta blocker to take for your heart failure. Take either as directed as this improves your heart function and your survival. Ask your caregiver if being on statins (cholesterol lowering drugs) would be helpful.   Weight Monitoring---Weigh yourself today. When you get home, compare it to your scale and record your weight. Weigh twice per day and record these weights and try to weigh at the same time every day. It is best to weigh first thing in the morning, in your same clothes, after going to the bathroom and before eating or drinking anything. Place the scale on a hard surfaced floor. Bring these weights to your caregiver to be reviewed during your appointments.   Blood pressure monitoring should be done twice per week. You can get a home blood pressure cuff at your drugstore. Record these values and bring them with you for your clinic visits. Notify your caregiver if you become dizzy or lightheaded upon standing up.    Be familiar with your medications--- If you have trouble remembering when you took them, write down times or set your medications out in advance for the day or the week to avoid problems. If you are on medications and do not remember if you have taken your medication, just skip it for that day unless your caregiver advises you otherwise. If you are on a diuretic (water pill), take these in the morning so you are not up all night going to the bathroom.   If you are currently a smoker, it is time to quit. Nicotine makes your heart work harder and is one of the leading causes of cardiac (heart) deaths. Do not leave without a smoking cessation plan or instructions on help available to quit smoking.   Immunization with influenza and pneumococcal vaccines may reduce the risk of respiratory infection.   Nonsteroidal anti-inflammatory drugs should not be used. They can cause sodium (salt) retention and also may hurt the action of diuretics and ACE inhibitors.   Aldosterone Antagonists may have beneficial effects.   If you do not follow your diet and take your medications properly, this may rapidly lead to emergency care or hospitalization. Follow the advice of your caregiver.   What To Do If Symptoms Worsen--- If there are immediate problems go to the Emergency Department. This would include any symptoms which brought you in and which are getting worse rather than better. Call emergency services (911 in [place] for immediate care.  DAILY PATH TO QUALITY LIVING   Monitor weight and record.   Monitor blood pressure and record.   Monitor fluid intake.   Monitor Sodium intake.   Monitor Activity Levels.   Take your medications.   Stop all use of   nicotine.   Avoid alcohol.   Know when to call for help and do so.  SEEK IMMEDIATE MEDICAL CARE IF:   Your weight increases by 3 lb/1.4 kg in 1 day or 5 lb/2.3 kg in a week, or as your caregiver suggests.    You notice increasing shortness of breath during rest, sleeping, or with activity, and which is unusual for you.   You develop chest pain.   You develop sweating or nausea which is unusual for you.   You notice increased swelling in your hands, feet, ankles or abdomen.   You have a feeling of fullness in your abdomen or develop nausea or loss of appetite.   You notice dizziness, blurred vision, headache, or unsteadiness.  Make an appointment with your caregiver as directed for follow-up.  MAKE SURE YOU:     Understand these instructions.   Will watch your condition.   Will get help right away if you are not doing well or get worse.  Document Released: 10/10/2004 Document Revised: 10/15/2011 Document Reviewed: 09/19/2007  [employer] Patient Information 2013 [employer]

## 2012-06-13 NOTE — Assessment & Plan Note (Signed)
Well controlled, no changes to meds today 

## 2012-06-13 NOTE — Assessment & Plan Note (Signed)
Doing great. Just saw her endocrinologist Dr. Horald Pollen and her hemoglobin A1c was 6.1 last week. Reports her blood sugars are well controlled and she feels well

## 2012-06-13 NOTE — Assessment & Plan Note (Signed)
Will request a copy of her echo to quantify the degree of regurgitation

## 2012-06-13 NOTE — Progress Notes (Signed)
Patient ID: Andrea Calderon, female   DOB: 24-Oct-1945, 66 y.o.   MRN: [id number] Andrea Calderon [id number] Oct 08, 1945 06/13/2012      Progress Note-Follow Up  Subjective  Chief Complaint  Chief Complaint  Patient presents with  . Follow-up    6 month    HPI  This is a 66 year old Caucasian female who is here today for routine followup. Overall she doing well. She is following closely with Dr. Tresa Endo of OB/GYN at [facility] and recently had a Pap smear which was unremarkable. She continues to follow with oncology for her history of colon cancers doing well in this regard. A recent CAT scan did reveal some possible thyroid nodule so her endocrinologist check an ultrasound. Preliminary results did show some small thyroid nodules and she is to followup with endocrine. Heart murmur was noted she was set up with [facility] and she reports echo showed some mitral and aortic valve regurgitation but she's unsure to what degree. She agrees to take her flu shot today. She has seen her endocrinologist recently and her hemoglobin A1c was 6.1. She's not had any recent illness, fevers, chills, chest pain, palpitations, shortness of breath, GI or GU complaints recently.  Past Medical History  Diagnosis Date  . Diabetes mellitus   . 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 Valvular heart disease 06/13/2012    Cardiologist Dr Heron Nay of [facility] in [facility]  . Multiple thyroid nodules 06/13/2012    Past Surgical History  Procedure Date  . Colectomy 2009  . Abdominal hysterectomy 2009    total  . Ventral hernia repair 11/11    4 lesions found, mesh left in place    Family History  Problem Relation Age of Onset  . Transient ischemic attack Mother   . Hypertension Mother   . Dementia Mother   . Diabetes Father     borderline sugar  . Coronary artery disease Father   . Heart disease Maternal Grandfather   . Heart disease Paternal Grandfather     History   Social History  . Marital Status:  Married    Spouse Name: N/A    Number of Children: N/A  . Years of Education: N/A   Occupational History  . Not on file.   Social History Main Topics  . Smoking status: Never Smoker   . Smokeless tobacco: Never Used  . Alcohol Use: 0.0 oz/week    0 drink(s) per week     Comment: rare, social  . Drug Use: No  . Sexually Active: Not on file   Other Topics Concern  . Not on file   Social History Narrative  . No narrative on file    Current Outpatient Prescriptions on File Prior to Visit  Medication Sig Dispense Refill  . aspirin 325 MG tablet Take 325 mg by mouth daily.      . B Complex Vitamins (VITAMIN-B COMPLEX PO) Take 1 tablet by mouth daily.      . fluocinonide gel (LIDEX) 0.05 % Apply 1 application topically as needed.      Marland Kitchen glucose blood (ACCU-CHEK AVIVA PLUS) test strip 1 each by Other route 2 (two) times daily. Use as instructed  100 each  0  . hydrochlorothiazide 25 MG tablet Take 25 mg by mouth daily.        . metFORMIN (GLUCOPHAGE) 500 MG tablet Take 500 mg by mouth 2 (two) times daily with a meal.       . quinapril (ACCUPRIL) 10  MG tablet Take 10 mg by mouth daily.        . rosuvastatin (CRESTOR) 10 MG tablet Take 1 tablet (10 mg total) by mouth daily.  30 tablet  2    Allergies  Allergen Reactions  . Codeine Nausea And Vomiting    Review of Systems  Review of Systems  Constitutional: Negative for fever and malaise/fatigue.  HENT: Negative for congestion.   Eyes: Negative for discharge.  Respiratory: Negative for shortness of breath.   Cardiovascular: Negative for chest pain, palpitations and leg swelling.  Gastrointestinal: Negative for nausea, abdominal pain and diarrhea.  Genitourinary: Negative for dysuria.  Musculoskeletal: Negative for falls.  Skin: Negative for rash.  Neurological: Negative for loss of consciousness and headaches.  Endo/Heme/Allergies: Negative for polydipsia.  Psychiatric/Behavioral: Negative for depression and suicidal  ideas. The patient is not nervous/anxious and does not have insomnia.     Objective  BP 119/78  Pulse 64  Temp 97.8 F (36.6 C) (Temporal)  Ht 5\' 1"  (1.549 m)  Wt 154 lb 6.4 oz (70.035 kg)  BMI 29.17 kg/m2  SpO2 99%  Physical Exam  Physical Exam  Constitutional: She is oriented to person, place, and time and well-developed, well-nourished, and in no distress. No distress.  HENT:  Head: Normocephalic and atraumatic.  Eyes: Conjunctivae normal are normal.  Neck: Neck supple. No thyromegaly present.  Cardiovascular: Normal rate and regular rhythm.   Murmur heard.      1-2/6 sys Murmur  Pulmonary/Chest: Effort normal and breath sounds normal. She has no wheezes.  Abdominal: She exhibits no distension and no mass.  Musculoskeletal: She exhibits no edema.  Lymphadenopathy:    She has no cervical adenopathy.  Neurological: She is alert and oriented to person, place, and time.  Skin: Skin is warm and dry. No rash noted. She is not diaphoretic.  Psychiatric: Memory, affect and judgment normal.    Lab Results  Component Value Date   TSH 2.00 12/06/2011   Lab Results  Component Value Date   WBC 7.4 12/06/2011   HGB 14.1 12/06/2011   HCT 42.2 12/06/2011   MCV 88.4 12/06/2011   PLT 197.0 12/06/2011   Lab Results  Component Value Date   CREATININE 0.7 12/06/2011   BUN 19 12/06/2011   NA 138 12/06/2011   K 4.9 12/06/2011   CL 98 12/06/2011   CO2 31 12/06/2011   Lab Results  Component Value Date   ALT 18 12/06/2011   AST 24 12/06/2011   ALKPHOS 60 12/06/2011   BILITOT 0.7 12/06/2011   Lab Results  Component Value Date   CHOL 125 12/06/2011   Lab Results  Component Value Date   HDL 56.10 12/06/2011   Lab Results  Component Value Date   LDLCALC 44 12/06/2011   Lab Results  Component Value Date   TRIG 125.0 12/06/2011   Lab Results  Component Value Date   CHOLHDL 2 12/06/2011     Assessment & Plan  DM Doing great. Just saw her endocrinologist Dr. Horald Pollen and her hemoglobin A1c was 6.1 last  week. Reports her blood sugars are well controlled and she feels well  Valvular heart disease Will request a copy of her echo to quantify the degree of regurgitation  ESSENTIAL HYPERTENSION, BENIGN Well controlled, no changes to meds today  Multiple thyroid nodules 2 on right and 1 on left is awaiting final results. Will follow up with endocrinology

## 2012-06-17 ENCOUNTER — Other Ambulatory Visit (INDEPENDENT_AMBULATORY_CARE_PROVIDER_SITE_OTHER): Payer: Medicare Other

## 2012-06-17 DIAGNOSIS — E119 Type 2 diabetes mellitus without complications: Secondary | ICD-10-CM

## 2012-06-17 DIAGNOSIS — E663 Overweight: Secondary | ICD-10-CM

## 2012-06-17 DIAGNOSIS — I10 Essential (primary) hypertension: Secondary | ICD-10-CM

## 2012-06-17 DIAGNOSIS — E785 Hyperlipidemia, unspecified: Secondary | ICD-10-CM

## 2012-06-17 LAB — TSH: TSH: 0.55 u[IU]/mL (ref 0.35–5.50)

## 2012-06-17 LAB — LIPID PANEL
Cholesterol: 142 mg/dL (ref 0–200)
HDL: 52.2 mg/dL (ref >39.00)
Total CHOL/HDL Ratio: 3
Triglycerides: 133 mg/dL (ref 0.0–149.0)

## 2012-06-17 LAB — CBC
HCT: 42.2 % (ref 36.0–46.0)
Hemoglobin: 13.7 g/dL (ref 12.0–15.0)
MCHC: 32.5 g/dL (ref 30.0–36.0)
MCV: 89.7 fl (ref 78.0–100.0)
Platelets: 208 10*3/uL (ref 150.0–400.0)
RDW: 14.3 % (ref 11.5–14.6)

## 2012-06-17 LAB — RENAL FUNCTION PANEL
Albumin: 4 g/dL (ref 3.5–5.2)
BUN: 13 mg/dL (ref 6–23)
Chloride: 99 mEq/L (ref 96–112)
Creatinine, Ser: 0.8 mg/dL (ref 0.4–1.2)
GFR: 80.64 mL/min (ref >60.00)
Glucose, Bld: 91 mg/dL (ref 70–99)
Phosphorus: 4.3 mg/dL (ref 2.3–4.6)

## 2012-06-17 LAB — HEPATIC FUNCTION PANEL
Albumin: 4 g/dL (ref 3.5–5.2)
Total Protein: 7.4 g/dL (ref 6.0–8.3)

## 2012-07-16 ENCOUNTER — Other Ambulatory Visit: Payer: Self-pay | Admitting: Family Medicine

## 2012-08-06 LAB — HM COLONOSCOPY

## 2012-08-28 ENCOUNTER — Other Ambulatory Visit: Payer: Self-pay | Admitting: Endocrinology

## 2012-08-28 DIAGNOSIS — E049 Nontoxic goiter, unspecified: Secondary | ICD-10-CM

## 2012-10-02 ENCOUNTER — Other Ambulatory Visit: Payer: Self-pay | Admitting: Family Medicine

## 2012-11-17 ENCOUNTER — Other Ambulatory Visit: Payer: Self-pay

## 2012-11-18 ENCOUNTER — Ambulatory Visit
Admission: RE | Admit: 2012-11-18 | Discharge: 2012-11-18 | Disposition: A | Discharge status: Home / Self Care | Payer: Medicare Other | Source: Ambulatory Visit | Attending: Endocrinology | Admitting: Endocrinology

## 2012-11-18 DIAGNOSIS — E049 Nontoxic goiter, unspecified: Secondary | ICD-10-CM

## 2012-12-16 ENCOUNTER — Encounter: Payer: Self-pay | Admitting: Family Medicine

## 2012-12-16 ENCOUNTER — Ambulatory Visit (INDEPENDENT_AMBULATORY_CARE_PROVIDER_SITE_OTHER): Payer: Medicare Other | Admitting: Family Medicine

## 2012-12-16 VITALS — BP 106/72 | HR 57 | Temp 97.8°F | Ht 61.0 in | Wt 155.0 lb

## 2012-12-16 DIAGNOSIS — I10 Essential (primary) hypertension: Secondary | ICD-10-CM

## 2012-12-16 DIAGNOSIS — E785 Hyperlipidemia, unspecified: Secondary | ICD-10-CM | POA: Insufficient documentation

## 2012-12-16 DIAGNOSIS — E042 Nontoxic multinodular goiter: Secondary | ICD-10-CM

## 2012-12-16 DIAGNOSIS — E119 Type 2 diabetes mellitus without complications: Secondary | ICD-10-CM

## 2012-12-16 HISTORY — DX: Hyperlipidemia, unspecified: E78.5

## 2012-12-16 LAB — CBC
HCT: 42.1 % (ref 36.0–46.0)
Hemoglobin: 14 g/dL (ref 12.0–15.0)
MCV: 86.8 fL (ref 78.0–100.0)
RBC: 4.85 MIL/uL (ref 3.87–5.11)
WBC: 6.5 10*3/uL (ref 4.0–10.5)

## 2012-12-16 LAB — LIPID PANEL
HDL: 46 mg/dL (ref >39)
LDL Cholesterol: 79 mg/dL (ref 0–99)
Triglycerides: 142 mg/dL (ref <150)
VLDL: 28 mg/dL (ref 0–40)

## 2012-12-16 LAB — RENAL FUNCTION PANEL
Albumin: 4.4 g/dL (ref 3.5–5.2)
CO2: 31 mEq/L (ref 19–32)
Creat: 0.77 mg/dL (ref 0.50–1.10)
Phosphorus: 3.8 mg/dL (ref 2.3–4.6)

## 2012-12-16 LAB — HEPATIC FUNCTION PANEL
AST: 23 U/L (ref 0–37)
Albumin: 4.4 g/dL (ref 3.5–5.2)
Total Bilirubin: 0.3 mg/dL (ref 0.3–1.2)

## 2012-12-16 NOTE — Assessment & Plan Note (Signed)
Check lipid panel. Given samples of Crestor today

## 2012-12-16 NOTE — Assessment & Plan Note (Signed)
hgba1c this week with her endocrinologist was 6.1 off Metformin for 6 weeks. Continue to minimize simple carbs and stay active

## 2012-12-16 NOTE — Assessment & Plan Note (Signed)
Well controlled at current visit. No changes

## 2012-12-16 NOTE — Patient Instructions (Addendum)
Next appt annual exam with labs prior to visit, lipid, tsh, cbc, liver, renal

## 2012-12-16 NOTE — Progress Notes (Signed)
Patient ID: Andrea Calderon, female   DOB: 01/24/1946, 67 y.o.   MRN: [id number] [name] [id number] 29-Nov-1945 12/16/2012      Progress Note-Follow Up  Subjective  Chief Complaint  Chief Complaint  Patient presents with  . Follow-up    6 month    HPI  Patient is a 67 year old Caucasian female who is in today for followup. She's doing very well. She follows closely with endocrinology and anorexia recently stopped her metformin as her blood sugars are doing much better. She sees Dr. Manson Passey and reports just seeing her and being told her hemoglobin A1c was 6.1. She is also recently had a ultrasound of her thyroid which showed stable nodules. No recent illness. No chest pain, palpitations, fevers chills, GI or GU concerns noted today.  Past Medical History  Diagnosis Date  . Diabetes mellitus   . 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 Valvular heart disease 06/13/2012    Cardiologist Dr Heron Nay of [facility] in [facility]  . Multiple thyroid nodules 06/13/2012    Past Surgical History  Procedure Laterality Date  . Colectomy  2009  . Abdominal hysterectomy  2009    total  . Ventral hernia repair  11/11    4 lesions found, mesh left in place    Family History  Problem Relation Age of Onset  . Transient ischemic attack Mother   . Hypertension Mother   . Dementia Mother   . Diabetes Father     borderline sugar  . Coronary artery disease Father   . Heart disease Maternal Grandfather   . Heart disease Paternal Grandfather     History   Social History  . Marital Status: Married    Spouse Name: N/A    Number of Children: N/A  . Years of Education: N/A   Occupational History  . Not on file.   Social History Main Topics  . Smoking status: Never Smoker   . Smokeless tobacco: Never Used  . Alcohol Use: 0.0 oz/week    0 drink(s) per week     Comment: rare, social  . Drug Use: No  . Sexually Active: Not on file   Other Topics Concern  . Not on file   Social History  Narrative  . No narrative on file    Current Outpatient Prescriptions on File Prior to Visit  Medication Sig Dispense Refill  . aspirin 325 MG tablet Take 325 mg by mouth daily.      . B Complex Vitamins (VITAMIN-B COMPLEX PO) Take 1 tablet by mouth daily.      . CRESTOR 10 MG tablet TAKE 1 TABLET (10 MG TOTAL) BY MOUTH DAILY.  30 tablet  2  . fluocinonide gel (LIDEX) 0.05 % Apply 1 application topically as needed.      Marland Kitchen glucose blood (ACCU-CHEK AVIVA PLUS) test strip 1 each by Other route 2 (two) times daily. Use as instructed  100 each  0  . hydrochlorothiazide 25 MG tablet Take 25 mg by mouth daily.        Marland Kitchen zoster vaccine live, PF, (ZOSTAVAX) 78295 UNT/0.65ML injection Inject 19,400 Units into the skin once.  1 each  0   No current facility-administered medications on file prior to visit.    Allergies  Allergen Reactions  . Codeine Nausea And Vomiting    Review of Systems  Review of Systems  Constitutional: Negative for fever and malaise/fatigue.  HENT: Negative for congestion.   Eyes: Negative for discharge.  Respiratory:  Negative for shortness of breath.   Cardiovascular: Negative for chest pain, palpitations and leg swelling.  Gastrointestinal: Negative for nausea, abdominal pain and diarrhea.  Genitourinary: Negative for dysuria.  Musculoskeletal: Negative for falls.  Skin: Negative for rash.  Neurological: Negative for loss of consciousness and headaches.  Endo/Heme/Allergies: Negative for polydipsia.  Psychiatric/Behavioral: Negative for depression and suicidal ideas. The patient is not nervous/anxious and does not have insomnia.     Objective  BP 106/72  Pulse 57  Temp(Src) 97.8 F (36.6 C) (Oral)  Ht 5\' 1"  (1.549 m)  Wt 155 lb (70.308 kg)  BMI 29.3 kg/m2  SpO2 99%  Physical Exam  Physical Exam  Constitutional: She is well-developed, well-nourished, and in no distress. No distress.  HENT:  Left Ear: External ear normal.  Mouth/Throat: No  oropharyngeal exudate.  Eyes: EOM are normal. Left eye exhibits no discharge. No scleral icterus.  Neck: No JVD present. No tracheal deviation present.  Cardiovascular: Normal heart sounds and intact distal pulses.   Pulmonary/Chest: No respiratory distress. She has no rales.  Abdominal: She exhibits no distension and no mass. There is tenderness. There is no guarding.  Musculoskeletal: She exhibits no edema and no tenderness.  Lymphadenopathy:    She has no cervical adenopathy.  Skin: No rash noted. No erythema.    Lab Results  Component Value Date   TSH 0.55 06/17/2012   Lab Results  Component Value Date   WBC 5.9 06/17/2012   HGB 13.7 06/17/2012   HCT 42.2 06/17/2012   MCV 89.7 06/17/2012   PLT 208.0 06/17/2012   Lab Results  Component Value Date   CREATININE 0.8 06/17/2012   BUN 13 06/17/2012   NA 138 06/17/2012   K 4.0 06/17/2012   CL 99 06/17/2012   CO2 30 06/17/2012   Lab Results  Component Value Date   ALT 24 06/17/2012   AST 23 06/17/2012   ALKPHOS 60 06/17/2012   BILITOT 0.4 06/17/2012   Lab Results  Component Value Date   CHOL 142 06/17/2012   Lab Results  Component Value Date   HDL 52.20 06/17/2012   Lab Results  Component Value Date   LDLCALC 63 06/17/2012   Lab Results  Component Value Date   TRIG 133.0 06/17/2012   Lab Results  Component Value Date   CHOLHDL 3 06/17/2012     Assessment & Plan  ESSENTIAL HYPERTENSION, BENIGN Well controlled at current visit. No changes  DM hgba1c this week with her endocrinologist was 6.1 off Metformin for 6 weeks. Continue to minimize simple carbs and stay active  Multiple thyroid nodules Recent ultrasound 4/14 stable no concerns identified  Other and unspecified hyperlipidemia Check lipid panel. Given samples of Crestor today

## 2012-12-16 NOTE — Assessment & Plan Note (Signed)
Recent ultrasound 4/14 stable no concerns identified

## 2012-12-25 ENCOUNTER — Ambulatory Visit: Payer: Self-pay | Admitting: Family Medicine

## 2012-12-31 ENCOUNTER — Other Ambulatory Visit: Payer: Self-pay | Admitting: Family Medicine

## 2013-03-11 ENCOUNTER — Other Ambulatory Visit: Payer: Self-pay

## 2013-03-14 ENCOUNTER — Encounter (HOSPITAL_BASED_OUTPATIENT_CLINIC_OR_DEPARTMENT_OTHER): Payer: Self-pay | Admitting: Emergency Medicine

## 2013-03-14 ENCOUNTER — Emergency Department (HOSPITAL_BASED_OUTPATIENT_CLINIC_OR_DEPARTMENT_OTHER)
Admission: EM | Admit: 2013-03-14 | Discharge: 2013-03-14 | Disposition: A | Discharge status: Home / Self Care | Payer: Medicare Other | Attending: Emergency Medicine | Admitting: Emergency Medicine

## 2013-03-14 ENCOUNTER — Emergency Department (HOSPITAL_BASED_OUTPATIENT_CLINIC_OR_DEPARTMENT_OTHER): Payer: Medicare Other

## 2013-03-14 DIAGNOSIS — Z79899 Other long term (current) drug therapy: Secondary | ICD-10-CM | POA: Insufficient documentation

## 2013-03-14 DIAGNOSIS — Z872 Personal history of diseases of the skin and subcutaneous tissue: Secondary | ICD-10-CM | POA: Insufficient documentation

## 2013-03-14 DIAGNOSIS — Z8639 Personal history of other endocrine, nutritional and metabolic disease: Secondary | ICD-10-CM | POA: Insufficient documentation

## 2013-03-14 DIAGNOSIS — J209 Acute bronchitis, unspecified: Secondary | ICD-10-CM | POA: Insufficient documentation

## 2013-03-14 DIAGNOSIS — Z862 Personal history of diseases of the blood and blood-forming organs and certain disorders involving the immune mechanism: Secondary | ICD-10-CM | POA: Insufficient documentation

## 2013-03-14 DIAGNOSIS — J40 Bronchitis, not specified as acute or chronic: Secondary | ICD-10-CM

## 2013-03-14 DIAGNOSIS — Z8679 Personal history of other diseases of the circulatory system: Secondary | ICD-10-CM | POA: Insufficient documentation

## 2013-03-14 DIAGNOSIS — Z7982 Long term (current) use of aspirin: Secondary | ICD-10-CM | POA: Insufficient documentation

## 2013-03-14 DIAGNOSIS — E119 Type 2 diabetes mellitus without complications: Secondary | ICD-10-CM | POA: Insufficient documentation

## 2013-03-14 MED ORDER — BENZONATATE 100 MG PO CAPS
200.0000 mg | ORAL_CAPSULE | Freq: Once | ORAL | Status: AC
  Administered 2013-03-14: 200 mg via ORAL
  Filled 2013-03-14: qty 2

## 2013-03-14 MED ORDER — AZITHROMYCIN 250 MG PO TABS: ORAL_TABLET | ORAL | Status: DC

## 2013-03-14 MED ORDER — LORATADINE 10 MG PO TABS: 10.0000 mg | ORAL_TABLET | Freq: Every day | ORAL | Status: DC

## 2013-03-14 NOTE — ED Notes (Signed)
Cough x3 days after airplane flight from [place].

## 2013-03-14 NOTE — ED Provider Notes (Signed)
CSN: [id number]     Arrival date & time 03/14/13  0341 History     First MD Initiated Contact with Patient 03/14/13 0401     Chief Complaint  Patient presents with  . Cough   (Consider location/radiation/quality/duration/timing/severity/associated sxs/prior Treatment) Patient is a 67 y.o. female presenting with cough. The history is provided by the patient. No language interpreter was used.  Cough Cough characteristics:  Dry Severity:  Moderate Onset quality:  Gradual Duration:  3 days Timing:  Intermittent Progression:  Unchanged Chronicity:  New Context: sick contacts   Relieved by:  Nothing Worsened by:  Nothing tried Ineffective treatments:  None tried Associated symptoms: no shortness of breath, no sinus congestion and no wheezing   Risk factors: recent travel     Past Medical History  Diagnosis Date  . Diabetes mellitus   . 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 Valvular heart disease 06/13/2012    Cardiologist Dr Heron Nay of [facility] in [facility]  . Multiple thyroid nodules 06/13/2012  . Other and unspecified hyperlipidemia 12/16/2012   Past Surgical History  Procedure Laterality Date  . Colectomy  2009  . Abdominal hysterectomy  2009    total  . Ventral hernia repair  11/11    4 lesions found, mesh left in place   Family History  Problem Relation Age of Onset  . Transient ischemic attack Mother   . Hypertension Mother   . Dementia Mother   . Diabetes Father     borderline sugar  . Coronary artery disease Father   . Heart disease Maternal Grandfather   . Heart disease Paternal Grandfather    History  Substance Use Topics  . Smoking status: Never Smoker   . Smokeless tobacco: Never Used  . Alcohol Use: 0.0 oz/week    0 drink(s) per week     Comment: rare, social   OB History   Grav Para Term Preterm Abortions TAB SAB Ect Mult Living                 Review of Systems  Respiratory: Positive for cough. Negative for shortness of breath and  wheezing.   All other systems reviewed and are negative.    Allergies  Codeine  Home Medications   Current Outpatient Rx  Name  Route  Sig  Dispense  Refill  . aspirin 325 MG tablet   Oral   Take 325 mg by mouth daily.         . B Complex Vitamins (VITAMIN-B COMPLEX PO)   Oral   Take 1 tablet by mouth daily.         . CRESTOR 10 MG tablet      TAKE 1 TABLET (10 MG TOTAL) BY MOUTH DAILY.   30 tablet   2   . fluocinonide gel (LIDEX) 0.05 %   Topical   Apply 1 application topically as needed.         Marland Kitchen glucose blood (ACCU-CHEK AVIVA PLUS) test strip      1 each by Other route 2 (two) times daily. Use as instructed   100 each   0     DX code:250.00   . hydrochlorothiazide 25 MG tablet   Oral   Take 25 mg by mouth daily.           Marland Kitchen lisinopril (PRINIVIL,ZESTRIL) 20 MG tablet   Oral   Take 20 mg by mouth daily.         Marland Kitchen zoster vaccine live,  PF, (ZOSTAVAX) 46962 UNT/0.65ML injection   Subcutaneous   Inject 19,400 Units into the skin once.   1 each   0    BP 121/52  Pulse 66  Temp(Src) 98.3 F (36.8 C) (Oral)  Resp 16  Ht 5\' 2"  (1.575 m)  Wt 150 lb (68.04 kg)  BMI 27.43 kg/m2  SpO2 100% Physical Exam  Constitutional: She is oriented to person, place, and time. She appears well-developed and well-nourished. No distress.  HENT:  Head: Normocephalic and atraumatic.  Mouth/Throat: Oropharynx is clear and moist.  Eyes: Conjunctivae are normal. Pupils are equal, round, and reactive to light.  Neck: Normal range of motion. Neck supple.  Cardiovascular: Normal rate and regular rhythm.   Pulmonary/Chest: Effort normal and breath sounds normal. She has no wheezes. She has no rales.  Abdominal: Soft. Bowel sounds are normal. There is no tenderness. There is no rebound and no guarding.  Musculoskeletal: Normal range of motion.  Neurological: She is alert and oriented to person, place, and time.  Skin: Skin is warm and dry.  Psychiatric: She has a  normal mood and affect.    ED Course   Procedures (including critical care time)  Labs Reviewed - No data to display No results found. No diagnosis found.  MDM  Patient is on tessalon perles and patient's husband has pneumonia.  With patient's history will prescribe Zpak  Baruc Tugwell Smitty Cords, MD 03/14/13 [phone]

## 2013-03-20 ENCOUNTER — Encounter: Payer: Self-pay | Admitting: Physician Assistant

## 2013-03-20 ENCOUNTER — Ambulatory Visit (INDEPENDENT_AMBULATORY_CARE_PROVIDER_SITE_OTHER): Payer: Medicare Other | Admitting: Physician Assistant

## 2013-03-20 VITALS — BP 122/74 | HR 67 | Temp 97.8°F | Resp 14 | Wt 154.2 lb

## 2013-03-20 DIAGNOSIS — J209 Acute bronchitis, unspecified: Secondary | ICD-10-CM | POA: Insufficient documentation

## 2013-03-20 NOTE — Assessment & Plan Note (Signed)
Diagnosed in ED 03/14/13.  Resolved.  Patient informed that cough may persist an additional 1-2 weeks. Continue rest/hydration.  Continue tessalon perles as needed for cough.

## 2013-03-20 NOTE — Patient Instructions (Signed)
Please continue Tessalon Perles and Nyquil for cough.  Remember that cough may linger for up to a couple of weeks.  Please get plenty of rest and drink plenty of fluids.  Call if symptoms worsen.   Acute Bronchitis You have acute bronchitis. This means you have a chest cold. The airways in your lungs are red and sore (inflamed). Acute means it is sudden onset.  CAUSES Bronchitis is most often caused by the same virus that causes a cold. SYMPTOMS   Body aches.  Chest congestion.  Chills.  Cough.  Fever.  Shortness of breath.  Sore throat. TREATMENT  Acute bronchitis is usually treated with rest, fluids, and medicines for relief of fever or cough. Most symptoms should go away after a few days or a week. Increased fluids may help thin your secretions and will prevent dehydration. Your caregiver may give you an inhaler to improve your symptoms. The inhaler reduces shortness of breath and helps control cough. You can take over-the-counter pain relievers or cough medicine to decrease coughing, pain, or fever. A cool-air vaporizer may help thin bronchial secretions and make it easier to clear your chest. Antibiotics are usually not needed but can be prescribed if you smoke, are seriously ill, have chronic lung problems, are elderly, or you are at higher risk for developing complications.Allergies and asthma can make bronchitis worse. Repeated episodes of bronchitis may cause longstanding lung problems. Avoid smoking and secondhand smoke.Exposure to cigarette smoke or irritating chemicals will make bronchitis worse. If you are a cigarette smoker, consider using nicotine gum or skin patches to help control withdrawal symptoms. Quitting smoking will help your lungs heal faster. Recovery from bronchitis is often slow, but you should start feeling better after 2 to 3 days. Cough from bronchitis frequently lasts for 3 to 4 weeks. To prevent another bout of acute bronchitis:  Quit smoking.  Wash  your hands frequently to get rid of viruses or use a hand sanitizer.  Avoid other people with cold or virus symptoms.  Try not to touch your hands to your mouth, nose, or eyes. SEEK IMMEDIATE MEDICAL CARE IF:  You develop increased fever, chills, or chest pain.  You have severe shortness of breath or bloody sputum.  You develop dehydration, fainting, repeated vomiting, or a severe headache.  You have no improvement after 1 week of treatment or you get worse. MAKE SURE YOU:   Understand these instructions.  Will watch your condition.  Will get help right away if you are not doing well or get worse. Document Released: 08/30/2004 Document Revised: 10/15/2011 Document Reviewed: 11/15/2010 [facility] Patient Information 2014 [place], [place].

## 2013-03-20 NOTE — Progress Notes (Signed)
Patient ID: Andrea Calderon, female   DOB: 29-Dec-1945, 67 y.o.   MRN: [id number]  Patient is a 67 year-old caucasian female who presents to clinic today for ED follow-up for acute bronchitis.  Patient was seen 03/14/13 and diagnosed with bronchitis.  Given Z-pack and Rx for [employer].  Patient states she is feeling much better.  Denies shortness of breath, wheezing, sore throat, fevers, chills.  Still having cough, but cough is now nonproductive and only bothersome at night.  Has been taking Nyquil to help with PM cough and sleep.  No new complaints.  Past Medical History  Diagnosis Date  . Diabetes mellitus   . 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 Valvular heart disease 06/13/2012    Cardiologist Dr Heron Nay of [facility] in [facility]  . Multiple thyroid nodules 06/13/2012  . Other and unspecified hyperlipidemia 12/16/2012   Current Outpatient Prescriptions on File Prior to Visit  Medication Sig Dispense Refill  . aspirin 325 MG tablet Take 325 mg by mouth daily.      . B Complex Vitamins (VITAMIN-B COMPLEX PO) Take 1 tablet by mouth daily.      . CRESTOR 10 MG tablet TAKE 1 TABLET (10 MG TOTAL) BY MOUTH DAILY.  30 tablet  2  . fluocinonide gel (LIDEX) 0.05 % Apply 1 application topically as needed.      Marland Kitchen glucose blood (ACCU-CHEK AVIVA PLUS) test strip 1 each by Other route 2 (two) times daily. Use as instructed  100 each  0  . hydrochlorothiazide 25 MG tablet Take 25 mg by mouth daily.        Marland Kitchen lisinopril (PRINIVIL,ZESTRIL) 20 MG tablet Take 20 mg by mouth daily.      Marland Kitchen zoster vaccine live, PF, (ZOSTAVAX) 46962 UNT/0.65ML injection Inject 19,400 Units into the skin once.  1 each  0   No current facility-administered medications on file prior to visit.   Allergies  Allergen Reactions  . Codeine Nausea And Vomiting   Family History  Problem Relation Age of Onset  . Transient ischemic attack Mother   . Hypertension Mother   . Dementia Mother   . Diabetes Father     borderline sugar   . Coronary artery disease Father   . Heart disease Maternal Grandfather   . Heart disease Paternal Grandfather    History   Social History  . Marital Status: Married    Spouse Name: N/A    Number of Children: N/A  . Years of Education: N/A   Social History Main Topics  . Smoking status: Never Smoker   . Smokeless tobacco: Never Used  . Alcohol Use: 0.0 oz/week    0 drink(s) per week     Comment: rare, social  . Drug Use: No  . Sexual Activity: None   Other Topics Concern  . None   Social History Narrative  . None   Review of Systems  Constitutional: Negative for fever, chills, weight loss and malaise/fatigue.  HENT: Negative for congestion.   Respiratory: Positive for cough. Negative for hemoptysis, sputum production, shortness of breath and wheezing.   Gastrointestinal: Negative for nausea, vomiting, abdominal pain, diarrhea and constipation.  Musculoskeletal: Negative for myalgias.  Neurological: Negative for headaches.   Filed Vitals:   03/20/13 0949  BP: 122/74  Pulse: 67  Temp: 97.8 F (36.6 C)  Resp: 14   Physical Exam  Vitals reviewed. Constitutional: She is oriented to person, place, and time and well-developed, well-nourished, and in no distress.  HENT:  Head: Normocephalic and atraumatic.  Right Ear: External ear normal.  Left Ear: External ear normal.  Nose: Nose normal.  Mouth/Throat: Oropharynx is clear and moist. No oropharyngeal exudate.  Eyes: Conjunctivae are normal. Pupils are equal, round, and reactive to light.  Neck: Neck supple.  Cardiovascular: Normal rate, regular rhythm, normal heart sounds and intact distal pulses.   Pulmonary/Chest: Effort normal and breath sounds normal. No respiratory distress. She has no wheezes. She has no rales. She exhibits no tenderness.  Lymphadenopathy:    She has no cervical adenopathy.  Neurological: She is alert and oriented to person, place, and time.  Skin: Skin is warm and dry. No rash noted.    Assessment/Plan: Acute bronchitis Diagnosed in ED 03/14/13.  Resolved.  Patient informed that cough may persist an additional 1-2 weeks. Continue rest/hydration.  Continue tessalon perles as needed for cough.

## 2013-05-04 ENCOUNTER — Other Ambulatory Visit: Payer: Self-pay | Admitting: Family Medicine

## 2013-05-04 NOTE — Telephone Encounter (Signed)
Rx request to pharmacy/SLS  

## 2013-06-02 ENCOUNTER — Telehealth: Payer: Self-pay

## 2013-06-02 NOTE — Telephone Encounter (Signed)
Pt left a message stating that she was told by the front desk that she couldn't go to OR for labs.  I left a message on pts answering machine stating that the reason for this was because the lab techs last day was this Friday so they would need to come here or [place].

## 2013-06-04 ENCOUNTER — Telehealth: Payer: Self-pay

## 2013-06-04 DIAGNOSIS — I10 Essential (primary) hypertension: Secondary | ICD-10-CM

## 2013-06-04 DIAGNOSIS — E785 Hyperlipidemia, unspecified: Secondary | ICD-10-CM

## 2013-06-04 DIAGNOSIS — E119 Type 2 diabetes mellitus without complications: Secondary | ICD-10-CM

## 2013-06-04 LAB — RENAL FUNCTION PANEL
BUN: 14 mg/dL (ref 6–23)
CO2: 29 mEq/L (ref 19–32)
Chloride: 100 mEq/L (ref 96–112)
Creat: 0.7 mg/dL (ref 0.50–1.10)
Glucose, Bld: 110 mg/dL — ABNORMAL HIGH (ref 70–99)
Phosphorus: 4.7 mg/dL — ABNORMAL HIGH (ref 2.3–4.6)
Potassium: 4.6 mEq/L (ref 3.5–5.3)

## 2013-06-04 LAB — HEPATIC FUNCTION PANEL
ALT: 15 U/L (ref 0–35)
AST: 18 U/L (ref 0–37)
Albumin: 4.2 g/dL (ref 3.5–5.2)
Alkaline Phosphatase: 74 U/L (ref 39–117)
Total Bilirubin: 0.4 mg/dL (ref 0.3–1.2)

## 2013-06-04 LAB — LIPID PANEL
Cholesterol: 144 mg/dL (ref 0–200)
Triglycerides: 114 mg/dL (ref <150)

## 2013-06-04 LAB — CBC
HCT: 41.3 % (ref 36.0–46.0)
Hemoglobin: 13.9 g/dL (ref 12.0–15.0)
MCH: 29.1 pg (ref 26.0–34.0)
MCHC: 33.7 g/dL (ref 30.0–36.0)
MCV: 86.4 fL (ref 78.0–100.0)

## 2013-06-04 NOTE — Telephone Encounter (Signed)
Lab order placed.

## 2013-06-11 ENCOUNTER — Other Ambulatory Visit: Payer: Self-pay

## 2013-06-18 ENCOUNTER — Ambulatory Visit (INDEPENDENT_AMBULATORY_CARE_PROVIDER_SITE_OTHER): Payer: Medicare Other | Admitting: Family Medicine

## 2013-06-18 ENCOUNTER — Telehealth: Payer: Self-pay | Admitting: Family Medicine

## 2013-06-18 ENCOUNTER — Encounter: Payer: Self-pay | Admitting: Family Medicine

## 2013-06-18 DIAGNOSIS — E785 Hyperlipidemia, unspecified: Secondary | ICD-10-CM

## 2013-06-18 DIAGNOSIS — Z0000 Encounter for general adult medical examination without abnormal findings: Secondary | ICD-10-CM

## 2013-06-18 DIAGNOSIS — I10 Essential (primary) hypertension: Secondary | ICD-10-CM

## 2013-06-18 DIAGNOSIS — E119 Type 2 diabetes mellitus without complications: Secondary | ICD-10-CM

## 2013-06-18 LAB — RENAL FUNCTION PANEL
Albumin: 4.3 g/dL (ref 3.5–5.2)
CO2: 31 mEq/L (ref 19–32)
Calcium: 10.2 mg/dL (ref 8.4–10.5)
Creat: 0.82 mg/dL (ref 0.50–1.10)

## 2013-06-18 NOTE — Progress Notes (Signed)
Patient ID: GENELDA ROARK, female   DOB: 04-19-1946, 67 y.o.   MRN: [id number] JOLAYNE BRANSON [id number] 1945/11/29 06/18/2013      Progress Note-Follow Up  Subjective  Chief Complaint  Chief Complaint  Patient presents with  . Annual Exam    Medicare Wellness [Labs done 10.30.14]    HPI  Patient is a 67 year old Caucasian female who is in today for routine medical care. Overall she's feeling well. She does follow with numerous specialists. She is following with Dr. Juliet Rude of endocrinology and they're monitoring some lymph nodes in her left neck as well as her sugar. She's got her hemoglobin A1c down from 96 and she feels well. Denies polyuria and polydipsia. Follows with dermatology in [place] as well as cardiology, oncology, gastroenterology and ophthalmology. 7 recent episode of bronchitis but is improving. No chest pain or palpitations. No shortness of breath GI or GU concerns.  Past Medical History  Diagnosis Date  . Diabetes mellitus   . 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 Valvular heart disease 06/13/2012    Cardiologist Dr Heron Nay of [facility] in [facility]  . Multiple thyroid nodules 06/13/2012  . Other and unspecified hyperlipidemia 12/16/2012    Past Surgical History  Procedure Laterality Date  . Colectomy  2009  . Abdominal hysterectomy  2009    total  . Ventral hernia repair  11/11    4 lesions found, mesh left in place    Family History  Problem Relation Age of Onset  . Transient ischemic attack Mother   . Hypertension Mother   . Dementia Mother   . Diabetes Father     borderline sugar  . Coronary artery disease Father   . Heart disease Maternal Grandfather   . Heart disease Paternal Grandfather     History   Social History  . Marital Status: Married    Spouse Name: N/A    Number of Children: N/A  . Years of Education: N/A   Occupational History  . Not on file.   Social History Main Topics  . Smoking status: Never Smoker   . Smokeless  tobacco: Never Used  . Alcohol Use: 0.0 oz/week    0 drink(s) per week     Comment: rare, social  . Drug Use: No  . Sexual Activity: Not on file   Other Topics Concern  . Not on file   Social History Narrative  . No narrative on file    Current Outpatient Prescriptions on File Prior to Visit  Medication Sig Dispense Refill  . aspirin 325 MG tablet Take 325 mg by mouth daily.      . B Complex Vitamins (VITAMIN-B COMPLEX PO) Take 1 tablet by mouth daily.      . CRESTOR 10 MG tablet TAKE 1 TABLET BY MOUTH EVERY DAY  30 tablet  1  . fluocinonide gel (LIDEX) 0.05 % Apply 1 application topically as needed.      Marland Kitchen glucose blood (ACCU-CHEK AVIVA PLUS) test strip 1 each by Other route 2 (two) times daily. Use as instructed  100 each  0  . hydrochlorothiazide 25 MG tablet Take 25 mg by mouth daily.        Marland Kitchen lisinopril (PRINIVIL,ZESTRIL) 20 MG tablet Take 20 mg by mouth daily.       No current facility-administered medications on file prior to visit.    Allergies  Allergen Reactions  . Codeine Nausea And Vomiting    Review of Systems  Review of Systems  Constitutional: Negative for fever, chills and malaise/fatigue.  HENT: Negative for congestion, hearing loss and nosebleeds.   Eyes: Negative for discharge.  Respiratory: Negative for cough, sputum production, shortness of breath and wheezing.   Cardiovascular: Negative for chest pain, palpitations and leg swelling.  Gastrointestinal: Negative for heartburn, nausea, vomiting, abdominal pain, diarrhea, constipation and blood in stool.  Genitourinary: Negative for dysuria, urgency, frequency and hematuria.  Musculoskeletal: Negative for back pain, falls and myalgias.  Skin: Negative for rash.  Neurological: Negative for dizziness, tremors, sensory change, focal weakness, loss of consciousness, weakness and headaches.  Endo/Heme/Allergies: Negative for polydipsia. Does not bruise/bleed easily.  Psychiatric/Behavioral: Negative for  depression and suicidal ideas. The patient is not nervous/anxious and does not have insomnia.     Objective  BP 116/64  Pulse 62  Temp(Src) 97.9 F (36.6 C) (Oral)  Resp 14  Ht 5\' 1"  (1.549 m)  Wt 155 lb 8 oz (70.534 kg)  BMI 29.40 kg/m2  SpO2 99%  Physical Exam  Physical Exam  Constitutional: She is oriented to person, place, and time and well-developed, well-nourished, and in no distress. No distress.  HENT:  Head: Normocephalic and atraumatic.  Eyes: Conjunctivae are normal.  Neck: Neck supple. No thyromegaly present.  Cardiovascular: Normal rate, regular rhythm and normal heart sounds.   No murmur heard. Pulmonary/Chest: Effort normal and breath sounds normal. She has no wheezes.  Abdominal: She exhibits no distension and no mass.  Musculoskeletal: She exhibits no edema.  Lymphadenopathy:    She has no cervical adenopathy.  Neurological: She is alert and oriented to person, place, and time.  Skin: Skin is warm and dry. No rash noted. She is not diaphoretic.  Psychiatric: Memory, affect and judgment normal.    Lab Results  Component Value Date   TSH 2.289 06/04/2013   Lab Results  Component Value Date   WBC 5.7 06/04/2013   HGB 13.9 06/04/2013   HCT 41.3 06/04/2013   MCV 86.4 06/04/2013   PLT 227 06/04/2013   Lab Results  Component Value Date   CREATININE 0.70 06/04/2013   BUN 14 06/04/2013   NA 139 06/04/2013   K 4.6 06/04/2013   CL 100 06/04/2013   CO2 29 06/04/2013   Lab Results  Component Value Date   ALT 15 06/04/2013   AST 18 06/04/2013   ALKPHOS 74 06/04/2013   BILITOT 0.4 06/04/2013   Lab Results  Component Value Date   CHOL 144 06/04/2013   Lab Results  Component Value Date   HDL 51 06/04/2013   Lab Results  Component Value Date   LDLCALC 70 06/04/2013   Lab Results  Component Value Date   TRIG 114 06/04/2013   Lab Results  Component Value Date   CHOLHDL 2.8 06/04/2013     Assessment & Plan  DM Mild, diet controlled,  no changes.  ESSENTIAL HYPERTENSION, BENIGN Well controlled, no changes  Other and unspecified hyperlipidemia Avoid trans fats, tolerating Crestor  Preventative health care Encouraged heart healthy diet, regular exercise, adequate sleep. Is doing well at home, no recent falls, no trouble with ADLs, no trouble with vision and hearing. Encouraged to supply [place] with any advanced planning directives she has

## 2013-06-18 NOTE — Progress Notes (Signed)
Pre visit review using our clinic review tool, if applicable. No additional management support is needed unless otherwise documented below in the visit note/SLS  

## 2013-06-18 NOTE — Telephone Encounter (Signed)
LAB ORDER WEEK OF 12-14-2013 Lipid, renal, cbc, tsh, hepatic, prior to next visit Patient may call to have [place] add [id number]

## 2013-06-18 NOTE — Patient Instructions (Signed)
[employer] probiotics, daily   Preventive Care for Adults, Female A healthy lifestyle and preventive care can promote health and wellness. Preventive health guidelines for women include the following key practices.  A routine yearly physical is a good way to check with your caregiver about your health and preventive screening. It is a chance to share any concerns and updates on your health, and to receive a thorough exam.  Visit your dentist for a routine exam and preventive care every 6 months. Brush your teeth twice a day and floss once a day. Good oral hygiene prevents tooth decay and gum disease.  The frequency of eye exams is based on your age, health, family medical history, use of contact lenses, and other factors. Follow your caregiver's recommendations for frequency of eye exams.  Eat a healthy diet. Foods like vegetables, fruits, whole grains, low-fat dairy products, and lean protein foods contain the nutrients you need without too many calories. Decrease your intake of foods high in solid fats, added sugars, and salt. Eat the right amount of calories for you.Get information about a proper diet from your caregiver, if necessary.  Regular physical exercise is one of the most important things you can do for your health. Most adults should get at least 150 minutes of moderate-intensity exercise (any activity that increases your heart rate and causes you to sweat) each week. In addition, most adults need muscle-strengthening exercises on 2 or more days a week.  Maintain a healthy weight. The body mass index (BMI) is a screening tool to identify possible weight problems. It provides an estimate of body fat based on height and weight. Your caregiver can help determine your BMI, and can help you achieve or maintain a healthy weight.For adults 20 years and older:  A BMI below 18.5 is considered underweight.  A BMI of 18.5 to 24.9 is normal.  A BMI of 25 to 29.9 is considered  overweight.  A BMI of 30 and above is considered obese.  Maintain normal blood lipids and cholesterol levels by exercising and minimizing your intake of saturated fat. Eat a balanced diet with plenty of fruit and vegetables. Blood tests for lipids and cholesterol should begin at age 30 and be repeated every 5 years. If your lipid or cholesterol levels are high, you are over 50, or you are at high risk for heart disease, you may need your cholesterol levels checked more frequently.Ongoing high lipid and cholesterol levels should be treated with medicines if diet and exercise are not effective.  If you smoke, find out from your caregiver how to quit. If you do not use tobacco, do not start.  Lung cancer screening is recommended for adults aged 75 80 years who are at high risk for developing lung cancer because of a history of smoking. Yearly low-dose computed tomography (CT) is recommended for people who have at least a 30-pack-year history of smoking and are a current smoker or have quit within the past 15 years. A pack year of smoking is smoking an average of 1 pack of cigarettes a day for 1 year (for example: 1 pack a day for 30 years or 2 packs a day for 15 years). Yearly screening should continue until the smoker has stopped smoking for at least 15 years. Yearly screening should also be stopped for people who develop a health problem that would prevent them from having lung cancer treatment.  If you are pregnant, do not drink alcohol. If you are breastfeeding, be very cautious  about drinking alcohol. If you are not pregnant and choose to drink alcohol, do not exceed 1 drink per day. One drink is considered to be 12 ounces (355 mL) of beer, 5 ounces (148 mL) of wine, or 1.5 ounces (44 mL) of liquor.  Avoid use of street drugs. Do not share needles with anyone. Ask for help if you need support or instructions about stopping the use of drugs.  High blood pressure causes heart disease and increases the  risk of stroke. Your blood pressure should be checked at least every 1 to 2 years. Ongoing high blood pressure should be treated with medicines if weight loss and exercise are not effective.  If you are 19 to 68 years old, ask your caregiver if you should take aspirin to prevent strokes.  Diabetes screening involves taking a blood sample to check your fasting blood sugar level. This should be done once every 3 years, after age 63, if you are within normal weight and without risk factors for diabetes. Testing should be considered at a younger age or be carried out more frequently if you are overweight and have at least 1 risk factor for diabetes.  Breast cancer screening is essential preventive care for women. You should practice "breast self-awareness." This means understanding the normal appearance and feel of your breasts and may include breast self-examination. Any changes detected, no matter how small, should be reported to a caregiver. Women in their 46s and 30s should have a clinical breast exam (CBE) by a caregiver as part of a regular health exam every 1 to 3 years. After age 12, women should have a CBE every year. Starting at age 15, women should consider having a mammography (breast X-ray test) every year. Women who have a family history of breast cancer should talk to their caregiver about genetic screening. Women at a high risk of breast cancer should talk to their caregivers about having magnetic resonance imaging (MRI) and a mammography every year.  Breast cancer gene (BRCA)-related cancer risk assessment is recommended for women who have family members with BRCA-related cancers. BRCA-related cancers include breast, ovarian, tubal, and peritoneal cancers. Having family members with these cancers may be associated with an increased risk for harmful changes (mutations) in the breast cancer genes BRCA1 and BRCA2. Results of the assessment will determine the need for genetic counseling and BRCA1  and BRCA2 testing.  The Pap test is a screening test for cervical cancer. A Pap test can show cell changes on the cervix that might become cervical cancer if left untreated. A Pap test is a procedure in which cells are obtained and examined from the lower end of the uterus (cervix).  Women should have a Pap test starting at age 77.  Between ages 43 and 76, Pap tests should be repeated every 2 years.  Beginning at age 33, you should have a Pap test every 3 years as long as the past 3 Pap tests have been normal.  Some women have medical problems that increase the chance of getting cervical cancer. Talk to your caregiver about these problems. It is especially important to talk to your caregiver if a new problem develops soon after your last Pap test. In these cases, your caregiver may recommend more frequent screening and Pap tests.  The above recommendations are the same for women who have or have not gotten the vaccine for human papillomavirus (HPV).  If you had a hysterectomy for a problem that was not cancer or a condition  that could lead to cancer, then you no longer need Pap tests. Even if you no longer need a Pap test, a regular exam is a good idea to make sure no other problems are starting.  If you are between ages 45 and 17, and you have had normal Pap tests going back 10 years, you no longer need Pap tests. Even if you no longer need a Pap test, a regular exam is a good idea to make sure no other problems are starting.  If you have had past treatment for cervical cancer or a condition that could lead to cancer, you need Pap tests and screening for cancer for at least 20 years after your treatment.  If Pap tests have been discontinued, risk factors (such as a new sexual partner) need to be reassessed to determine if screening should be resumed.  The HPV test is an additional test that may be used for cervical cancer screening. The HPV test looks for the virus that can cause the cell  changes on the cervix. The cells collected during the Pap test can be tested for HPV. The HPV test could be used to screen women aged 11 years and older, and should be used in women of any age who have unclear Pap test results. After the age of 63, women should have HPV testing at the same frequency as a Pap test.  Colorectal cancer can be detected and often prevented. Most routine colorectal cancer screening begins at the age of 87 and continues through age 67. However, your caregiver may recommend screening at an earlier age if you have risk factors for colon cancer. On a yearly basis, your caregiver may provide home test kits to check for hidden blood in the stool. Use of a small camera at the end of a tube, to directly examine the colon (sigmoidoscopy or colonoscopy), can detect the earliest forms of colorectal cancer. Talk to your caregiver about this at age 46, when routine screening begins. Direct examination of the colon should be repeated every 5 to 10 years through age 70, unless early forms of pre-cancerous polyps or small growths are found.  Hepatitis C blood testing is recommended for all people born from [birth year] through 1965 and any individual with known risks for hepatitis C.  Practice safe sex. Use condoms and avoid high-risk sexual practices to reduce the spread of sexually transmitted infections (STIs). STIs include gonorrhea, chlamydia, syphilis, trichomonas, herpes, HPV, and human immunodeficiency virus (HIV). Herpes, HIV, and HPV are viral illnesses that have no cure. They can result in disability, cancer, and death. Sexually active women aged 8 and younger should be checked for chlamydia. Older women with new or multiple partners should also be tested for chlamydia. Testing for other STIs is recommended if you are sexually active and at increased risk.  Osteoporosis is a disease in which the bones lose minerals and strength with aging. This can result in serious bone fractures. The risk  of osteoporosis can be identified using a bone density scan. Women ages 36 and over and women at risk for fractures or osteoporosis should discuss screening with their caregivers. Ask your caregiver whether you should take a calcium supplement or vitamin D to reduce the rate of osteoporosis.  Menopause can be associated with physical symptoms and risks. Hormone replacement therapy is available to decrease symptoms and risks. You should talk to your caregiver about whether hormone replacement therapy is right for you.  Use sunscreen. Apply sunscreen liberally and repeatedly throughout  the day. You should seek shade when your shadow is shorter than you. Protect yourself by wearing long sleeves, pants, a wide-brimmed hat, and sunglasses year round, whenever you are outdoors.  Once a month, do a whole body skin exam, using a mirror to look at the skin on your back. Notify your caregiver of new moles, moles that have irregular borders, moles that are larger than a pencil eraser, or moles that have changed in shape or color.  Stay current with required immunizations.  Influenza vaccine. All adults should be immunized every year.  Tetanus, diphtheria, and acellular pertussis (Td, Tdap) vaccine. Pregnant women should receive 1 dose of Tdap vaccine during each pregnancy. The dose should be obtained regardless of the length of time since the last dose. Immunization is preferred during the 27th to 36th week of gestation. An adult who has not previously received Tdap or who does not know her vaccine status should receive 1 dose of Tdap. This initial dose should be followed by tetanus and diphtheria toxoids (Td) booster doses every 10 years. Adults with an unknown or incomplete history of completing a 3-dose immunization series with Td-containing vaccines should begin or complete a primary immunization series including a Tdap dose. Adults should receive a Td booster every 10 years.  Varicella vaccine. An adult  without evidence of immunity to varicella should receive 2 doses or a second dose if she has previously received 1 dose. Pregnant females who do not have evidence of immunity should receive the first dose after pregnancy. This first dose should be obtained before leaving the health care facility. The second dose should be obtained 4 8 weeks after the first dose.  Human papillomavirus (HPV) vaccine. Females aged 79 26 years who have not received the vaccine previously should obtain the 3-dose series. The vaccine is not recommended for use in pregnant females. However, pregnancy testing is not needed before receiving a dose. If a female is found to be pregnant after receiving a dose, no treatment is needed. In that case, the remaining doses should be delayed until after the pregnancy. Immunization is recommended for any person with an immunocompromised condition through the age of 26 years if she did not get any or all doses earlier. During the 3-dose series, the second dose should be obtained 4 8 weeks after the first dose. The third dose should be obtained 24 weeks after the first dose and 16 weeks after the second dose.  Zoster vaccine. One dose is recommended for adults aged 48 years or older unless certain conditions are present.  Measles, mumps, and rubella (MMR) vaccine. Adults born before [birth year] generally are considered immune to measles and mumps. Adults born in [birth year] or later should have 1 or more doses of MMR vaccine unless there is a contraindication to the vaccine or there is laboratory evidence of immunity to each of the three diseases. A routine second dose of MMR vaccine should be obtained at least 28 days after the first dose for students attending postsecondary schools, health care workers, or international travelers. People who received inactivated measles vaccine or an unknown type of measles vaccine during 1963 1967 should receive 2 doses of MMR vaccine. People who received inactivated mumps  vaccine or an unknown type of mumps vaccine before 1979 and are at high risk for mumps infection should consider immunization with 2 doses of MMR vaccine. For females of childbearing age, rubella immunity should be determined. If there is no evidence of immunity, females who are not  pregnant should be vaccinated. If there is no evidence of immunity, females who are pregnant should delay immunization until after pregnancy. Unvaccinated health care workers born before [birth year] who lack laboratory evidence of measles, mumps, or rubella immunity or laboratory confirmation of disease should consider measles and mumps immunization with 2 doses of MMR vaccine or rubella immunization with 1 dose of MMR vaccine.  Pneumococcal 13-valent conjugate (PCV13) vaccine. When indicated, a person who is uncertain of her immunization history and has no record of immunization should receive the PCV13 vaccine. An adult aged 32 years or older who has certain medical conditions and has not been previously immunized should receive 1 dose of PCV13 vaccine. This PCV13 should be followed with a dose of pneumococcal polysaccharide (PPSV23) vaccine. The PPSV23 vaccine dose should be obtained at least 8 weeks after the dose of PCV13 vaccine. An adult aged 65 years or older who has certain medical conditions and previously received 1 or more doses of PPSV23 vaccine should receive 1 dose of PCV13. The PCV13 vaccine dose should be obtained 1 or more years after the last PPSV23 vaccine dose.  Pneumococcal polysaccharide (PPSV23) vaccine. When PCV13 is also indicated, PCV13 should be obtained first. All adults aged 52 years and older should be immunized. An adult younger than age 31 years who has certain medical conditions should be immunized. Any person who resides in a nursing home or long-term care facility should be immunized. An adult smoker should be immunized. People with an immunocompromised condition and certain other conditions should  receive both PCV13 and PPSV23 vaccines. People with human immunodeficiency virus (HIV) infection should be immunized as soon as possible after diagnosis. Immunization during chemotherapy or radiation therapy should be avoided. Routine use of PPSV23 vaccine is not recommended for American Indians, [address], or people younger than 65 years unless there are medical conditions that require PPSV23 vaccine. When indicated, people who have unknown immunization and have no record of immunization should receive PPSV23 vaccine. One-time revaccination 5 years after the first dose of PPSV23 is recommended for people aged 19 64 years who have chronic kidney failure, nephrotic syndrome, asplenia, or immunocompromised conditions. People who received 1 2 doses of PPSV23 before age 44 years should receive another dose of PPSV23 vaccine at age 66 years or later if at least 5 years have passed since the previous dose. Doses of PPSV23 are not needed for people immunized with PPSV23 at or after age 36 years.  Meningococcal vaccine. Adults with asplenia or persistent complement component deficiencies should receive 2 doses of quadrivalent meningococcal conjugate (MenACWY-D) vaccine. The doses should be obtained at least 2 months apart. Microbiologists working with certain meningococcal bacteria, military recruits, people at risk during an outbreak, and people who travel to or live in countries with a high rate of meningitis should be immunized. A first-year college student up through age 87 years who is living in a residence hall should receive a dose if she did not receive a dose on or after her 16th birthday. Adults who have certain high-risk conditions should receive one or more doses of vaccine.  Hepatitis A vaccine. Adults who wish to be protected from this disease, have certain high-risk conditions, work with hepatitis A-infected animals, work in hepatitis A research labs, or travel to or work in countries with a high  rate of hepatitis A should be immunized. Adults who were previously unvaccinated and who anticipate close contact with an international adoptee during the first 60 days after arrival in  the [place] from a country with a high rate of hepatitis A should be immunized.  Hepatitis B vaccine. Adults who wish to be protected from this disease, have certain high-risk conditions, may be exposed to blood or other infectious body fluids, are household contacts or sex partners of hepatitis B positive people, are clients or workers in certain care facilities, or travel to or work in countries with a high rate of hepatitis B should be immunized.  Haemophilus influenzae type b (Hib) vaccine. A previously unvaccinated person with asplenia or sickle cell disease or having a scheduled splenectomy should receive 1 dose of Hib vaccine. Regardless of previous immunization, a recipient of a hematopoietic stem cell transplant should receive a 3-dose series 6 12 months after her successful transplant. Hib vaccine is not recommended for adults with HIV infection. Preventive Services / Frequency Ages 51 to 24  Blood pressure check.** / Every 1 to 2 years.  Lipid and cholesterol check.** / Every 5 years beginning at age 6.  Clinical breast exam.** / Every 3 years for women in their 34s and 30s.  BRCA-related cancer risk assessment.** / For women who have family members with a BRCA-related cancer (breast, ovarian, tubal, or peritoneal cancers).  Pap test.** / Every 2 years from ages 78 through 20. Every 3 years starting at age 36 through age 85 or 61 with a history of 3 consecutive normal Pap tests.  HPV screening.** / Every 3 years from ages 68 through ages 22 to 89 with a history of 3 consecutive normal Pap tests.  Hepatitis C blood test.** / For any individual with known risks for hepatitis C.  Skin self-exam. / Monthly.  Influenza vaccine. / Every year.  Tetanus, diphtheria, and acellular pertussis (Tdap,  Td) vaccine.** / Consult your caregiver. Pregnant women should receive 1 dose of Tdap vaccine during each pregnancy. 1 dose of Td every 10 years.  Varicella vaccine.** / Consult your caregiver. Pregnant females who do not have evidence of immunity should receive the first dose after pregnancy.  HPV vaccine. / 3 doses over 6 months, if 26 and younger. The vaccine is not recommended for use in pregnant females. However, pregnancy testing is not needed before receiving a dose.  Measles, mumps, rubella (MMR) vaccine.** / You need at least 1 dose of MMR if you were born in 1957 or later. You may also need a 2nd dose. For females of childbearing age, rubella immunity should be determined. If there is no evidence of immunity, females who are not pregnant should be vaccinated. If there is no evidence of immunity, females who are pregnant should delay immunization until after pregnancy.  Pneumococcal 13-valent conjugate (PCV13) vaccine.** / Consult your caregiver.  Pneumococcal polysaccharide (PPSV23) vaccine.** / 1 to 2 doses if you smoke cigarettes or if you have certain conditions.  Meningococcal vaccine.** / 1 dose if you are age 70 to 35 years and a Orthoptist living in a residence hall, or have one of several medical conditions, you need to get vaccinated against meningococcal disease. You may also need additional booster doses.  Hepatitis A vaccine.** / Consult your caregiver.  Hepatitis B vaccine.** / Consult your caregiver.  Haemophilus influenzae type b (Hib) vaccine.** / Consult your caregiver. Ages 77 to 75  Blood pressure check.** / Every 1 to 2 years.  Lipid and cholesterol check.** / Every 5 years beginning at age 24.  Lung cancer screening. / Every year if you are aged 89 80 years and have  a 30-pack-year history of smoking and currently smoke or have quit within the past 15 years. Yearly screening is stopped once you have quit smoking for at least 15 years or develop a  health problem that would prevent you from having lung cancer treatment.  Clinical breast exam.** / Every year after age 33.  BRCA-related cancer risk assessment.** / For women who have family members with a BRCA-related cancer (breast, ovarian, tubal, or peritoneal cancers).  Mammogram.** / Every year beginning at age 65 and continuing for as long as you are in good health. Consult with your caregiver.  Pap test.** / Every 3 years starting at age 68 through age 74 or 44 with a history of 3 consecutive normal Pap tests.  HPV screening.** / Every 3 years from ages 7 through ages 23 to 36 with a history of 3 consecutive normal Pap tests.  Fecal occult blood test (FOBT) of stool. / Every year beginning at age 8 and continuing until age 10. You may not need to do this test if you get a colonoscopy every 10 years.  Flexible sigmoidoscopy or colonoscopy.** / Every 5 years for a flexible sigmoidoscopy or every 10 years for a colonoscopy beginning at age 5 and continuing until age 34.  Hepatitis C blood test.** / For all people born from [birth year] through 1966 and any individual with known risks for hepatitis C.  Skin self-exam. / Monthly.  Influenza vaccine. / Every year.  Tetanus, diphtheria, and acellular pertussis (Tdap/Td) vaccine.** / Consult your caregiver. Pregnant women should receive 1 dose of Tdap vaccine during each pregnancy. 1 dose of Td every 10 years.  Varicella vaccine.** / Consult your caregiver. Pregnant females who do not have evidence of immunity should receive the first dose after pregnancy.  Zoster vaccine.** / 1 dose for adults aged 46 years or older.  Measles, mumps, rubella (MMR) vaccine.** / You need at least 1 dose of MMR if you were born in 1957 or later. You may also need a 2nd dose. For females of childbearing age, rubella immunity should be determined. If there is no evidence of immunity, females who are not pregnant should be vaccinated. If there is no evidence of  immunity, females who are pregnant should delay immunization until after pregnancy.  Pneumococcal 13-valent conjugate (PCV13) vaccine.** / Consult your caregiver.  Pneumococcal polysaccharide (PPSV23) vaccine.** / 1 to 2 doses if you smoke cigarettes or if you have certain conditions.  Meningococcal vaccine.** / Consult your caregiver.  Hepatitis A vaccine.** / Consult your caregiver.  Hepatitis B vaccine.** / Consult your caregiver.  Haemophilus influenzae type b (Hib) vaccine.** / Consult your caregiver. Ages 34 and over  Blood pressure check.** / Every 1 to 2 years.  Lipid and cholesterol check.** / Every 5 years beginning at age 82.  Lung cancer screening. / Every year if you are aged 58 80 years and have a 30-pack-year history of smoking and currently smoke or have quit within the past 15 years. Yearly screening is stopped once you have quit smoking for at least 15 years or develop a health problem that would prevent you from having lung cancer treatment.  Clinical breast exam.** / Every year after age 7.  BRCA-related cancer risk assessment.** / For women who have family members with a BRCA-related cancer (breast, ovarian, tubal, or peritoneal cancers).  Mammogram.** / Every year beginning at age 66 and continuing for as long as you are in good health. Consult with your caregiver.  Pap test.** /  Every 3 years starting at age 20 through age 47 or 33 with a 3 consecutive normal Pap tests. Testing can be stopped between 65 and 70 with 3 consecutive normal Pap tests and no abnormal Pap or HPV tests in the past 10 years.  HPV screening.** / Every 3 years from ages 23 through ages 73 or 60 with a history of 3 consecutive normal Pap tests. Testing can be stopped between 65 and 70 with 3 consecutive normal Pap tests and no abnormal Pap or HPV tests in the past 10 years.  Fecal occult blood test (FOBT) of stool. / Every year beginning at age 3 and continuing until age 34. You may not  need to do this test if you get a colonoscopy every 10 years.  Flexible sigmoidoscopy or colonoscopy.** / Every 5 years for a flexible sigmoidoscopy or every 10 years for a colonoscopy beginning at age 75 and continuing until age 72.  Hepatitis C blood test.** / For all people born from [birth year] through 1966 and any individual with known risks for hepatitis C.  Osteoporosis screening.** / A one-time screening for women ages 70 and over and women at risk for fractures or osteoporosis.  Skin self-exam. / Monthly.  Influenza vaccine. / Every year.  Tetanus, diphtheria, and acellular pertussis (Tdap/Td) vaccine.** / 1 dose of Td every 10 years.  Varicella vaccine.** / Consult your caregiver.  Zoster vaccine.** / 1 dose for adults aged 40 years or older.  Pneumococcal 13-valent conjugate (PCV13) vaccine.** / Consult your caregiver.  Pneumococcal polysaccharide (PPSV23) vaccine.** / 1 dose for all adults aged 35 years and older.  Meningococcal vaccine.** / Consult your caregiver.  Hepatitis A vaccine.** / Consult your caregiver.  Hepatitis B vaccine.** / Consult your caregiver.  Haemophilus influenzae type b (Hib) vaccine.** / Consult your caregiver. ** Family history and personal history of risk and conditions may change your caregiver's recommendations. Document Released: 09/18/2001 Document Revised: 11/17/2012 Document Reviewed: 12/18/2010 [facility] Patient Information 2014 [place], [place].

## 2013-06-19 NOTE — Telephone Encounter (Signed)
Lab order placed.

## 2013-06-21 ENCOUNTER — Encounter: Payer: Self-pay | Admitting: Family Medicine

## 2013-06-21 DIAGNOSIS — Z0000 Encounter for general adult medical examination without abnormal findings: Secondary | ICD-10-CM

## 2013-06-21 HISTORY — DX: Encounter for general adult medical examination without abnormal findings: Z00.00

## 2013-06-21 NOTE — Assessment & Plan Note (Signed)
Mild, diet controlled, no changes.

## 2013-06-21 NOTE — Assessment & Plan Note (Signed)
Well controlled, no changes 

## 2013-06-21 NOTE — Assessment & Plan Note (Signed)
Encouraged heart healthy diet, regular exercise, adequate sleep. Is doing well at home, no recent falls, no trouble with ADLs, no trouble with vision and hearing. Encouraged to supply [place] with any advanced planning directives she has

## 2013-06-21 NOTE — Assessment & Plan Note (Signed)
Avoid trans fats, tolerating Crestor

## 2013-07-09 ENCOUNTER — Other Ambulatory Visit: Payer: Self-pay | Admitting: Family Medicine

## 2013-10-05 ENCOUNTER — Telehealth: Payer: Self-pay | Admitting: Family Medicine

## 2013-10-05 MED ORDER — ROSUVASTATIN CALCIUM 10 MG PO TABS: 10.0000 mg | ORAL_TABLET | Freq: Every day | ORAL | Status: DC

## 2013-10-05 NOTE — Telephone Encounter (Signed)
crestor 10 mg

## 2013-10-07 ENCOUNTER — Other Ambulatory Visit: Payer: Self-pay | Admitting: Endocrinology

## 2013-10-07 DIAGNOSIS — E049 Nontoxic goiter, unspecified: Secondary | ICD-10-CM

## 2013-12-05 LAB — HM MAMMOGRAPHY

## 2013-12-05 LAB — HM PAP SMEAR

## 2013-12-15 LAB — LIPID PANEL
Cholesterol: 143 mg/dL (ref 0–200)
HDL: 55 mg/dL (ref >39)
LDL Cholesterol: 72 mg/dL (ref 0–99)
Total CHOL/HDL Ratio: 2.6 Ratio
Triglycerides: 79 mg/dL (ref <150)
VLDL: 16 mg/dL (ref 0–40)

## 2013-12-15 LAB — CBC
HCT: 42.8 % (ref 36.0–46.0)
Hemoglobin: 14.6 g/dL (ref 12.0–15.0)
MCH: 29.3 pg (ref 26.0–34.0)
MCHC: 34.1 g/dL (ref 30.0–36.0)
MCV: 85.8 fL (ref 78.0–100.0)
Platelets: 201 10*3/uL (ref 150–400)
RBC: 4.99 MIL/uL (ref 3.87–5.11)
RDW: 14.2 % (ref 11.5–15.5)
WBC: 6.2 10*3/uL (ref 4.0–10.5)

## 2013-12-15 LAB — HEPATIC FUNCTION PANEL
ALT: 16 U/L (ref 0–35)
AST: 25 U/L (ref 0–37)
Albumin: 4.4 g/dL (ref 3.5–5.2)
Alkaline Phosphatase: 68 U/L (ref 39–117)
Bilirubin, Direct: 0.1 mg/dL (ref 0.0–0.3)
Indirect Bilirubin: 0.3 mg/dL (ref 0.2–1.2)
Total Bilirubin: 0.4 mg/dL (ref 0.2–1.2)
Total Protein: 7.2 g/dL (ref 6.0–8.3)

## 2013-12-15 LAB — RENAL FUNCTION PANEL
Albumin: 4.4 g/dL (ref 3.5–5.2)
BUN: 15 mg/dL (ref 6–23)
CO2: 31 mEq/L (ref 19–32)
Calcium: 10 mg/dL (ref 8.4–10.5)
Chloride: 99 mEq/L (ref 96–112)
Creat: 0.87 mg/dL (ref 0.50–1.10)
Glucose, Bld: 75 mg/dL (ref 70–99)
Phosphorus: 4 mg/dL (ref 2.3–4.6)
Potassium: 4.9 mEq/L (ref 3.5–5.3)
Sodium: 139 mEq/L (ref 135–145)

## 2013-12-15 LAB — TSH: TSH: 2.147 u[IU]/mL (ref 0.350–4.500)

## 2013-12-21 ENCOUNTER — Encounter: Payer: Self-pay | Admitting: Family Medicine

## 2013-12-21 ENCOUNTER — Ambulatory Visit (INDEPENDENT_AMBULATORY_CARE_PROVIDER_SITE_OTHER): Payer: Medicare Other | Admitting: Family Medicine

## 2013-12-21 VITALS — BP 120/90 | HR 65 | Temp 98.5°F | Ht 61.0 in | Wt 153.0 lb

## 2013-12-21 DIAGNOSIS — E119 Type 2 diabetes mellitus without complications: Secondary | ICD-10-CM

## 2013-12-21 DIAGNOSIS — E785 Hyperlipidemia, unspecified: Secondary | ICD-10-CM

## 2013-12-21 DIAGNOSIS — Z23 Encounter for immunization: Secondary | ICD-10-CM

## 2013-12-21 DIAGNOSIS — I10 Essential (primary) hypertension: Secondary | ICD-10-CM

## 2013-12-21 MED ORDER — PNEUMOCOCCAL 13-VAL CONJ VACC IM SUSP: 0.5000 mL | Freq: Once | INTRAMUSCULAR | Status: DC

## 2013-12-21 NOTE — Patient Instructions (Signed)
Salon pas gel or patches as needed after heat to neck    Back Pain, Adult Low back pain is very common. About 1 in 5 people have back pain.The cause of low back pain is rarely dangerous. The pain often gets better over time.About half of people with a sudden onset of back pain feel better in just 2 weeks. About 8 in 10 people feel better by 6 weeks.  CAUSES Some common causes of back pain include:  Strain of the muscles or ligaments supporting the spine.  Wear and tear (degeneration) of the spinal discs.  Arthritis.  Direct injury to the back. DIAGNOSIS Most of the time, the direct cause of low back pain is not known.However, back pain can be treated effectively even when the exact cause of the pain is unknown.Answering your caregiver's questions about your overall health and symptoms is one of the most accurate ways to make sure the cause of your pain is not dangerous. If your caregiver needs more information, he or she may order lab work or imaging tests (X-rays or MRIs).However, even if imaging tests show changes in your back, this usually does not require surgery. HOME CARE INSTRUCTIONS For many people, back pain returns.Since low back pain is rarely dangerous, it is often a condition that people can learn to [facility] their own.   Remain active. It is stressful on the back to sit or stand in one place. Do not sit, drive, or stand in one place for more than 30 minutes at a time. Take short walks on level surfaces as soon as pain allows.Try to increase the length of time you walk each day.  Do not stay in bed.Resting more than 1 or 2 days can delay your recovery.  Do not avoid exercise or work.Your body is made to move.It is not dangerous to be active, even though your back may hurt.Your back will likely heal faster if you return to being active before your pain is gone.  Pay attention to your body when you bend and lift. Many people have less discomfortwhen lifting if they  bend their knees, keep the load close to their bodies,and avoid twisting. Often, the most comfortable positions are those that put less stress on your recovering back.  Find a comfortable position to sleep. Use a firm mattress and lie on your side with your knees slightly bent. If you lie on your back, put a pillow under your knees.  Only take over-the-counter or prescription medicines as directed by your caregiver. Over-the-counter medicines to reduce pain and inflammation are often the most helpful.Your caregiver may prescribe muscle relaxant drugs.These medicines help dull your pain so you can more quickly return to your normal activities and healthy exercise.  Put ice on the injured area.  Put ice in a plastic bag.  Place a towel between your skin and the bag.  Leave the ice on for 15-20 minutes, 03-04 times a day for the first 2 to 3 days. After that, ice and heat may be alternated to reduce pain and spasms.  Ask your caregiver about trying back exercises and gentle massage. This may be of some benefit.  Avoid feeling anxious or stressed.Stress increases muscle tension and can worsen back pain.It is important to recognize when you are anxious or stressed and learn ways to manage it.Exercise is a great option. SEEK MEDICAL CARE IF:  You have pain that is not relieved with rest or medicine.  You have pain that does not improve in 1  week.  You have new symptoms.  You are generally not feeling well. SEEK IMMEDIATE MEDICAL CARE IF:   You have pain that radiates from your back into your legs.  You develop new bowel or bladder control problems.  You have unusual weakness or numbness in your arms or legs.  You develop nausea or vomiting.  You develop abdominal pain.  You feel faint. Document Released: 07/23/2005 Document Revised: 01/22/2012 Document Reviewed: 12/11/2010 [facility] Patient Information 2014 [place], [place].

## 2013-12-21 NOTE — Progress Notes (Signed)
Pre visit review using our clinic review tool, if applicable. No additional management support is needed unless otherwise documented below in the visit note. 

## 2013-12-22 ENCOUNTER — Telehealth: Payer: Self-pay | Admitting: Family Medicine

## 2013-12-22 NOTE — Telephone Encounter (Signed)
Relevant patient education mailed to patient.  

## 2013-12-22 NOTE — Telephone Encounter (Signed)
Received medical records from [facility]-Dr. Donnetta Hutching

## 2013-12-23 NOTE — Progress Notes (Signed)
Patient ID: Andrea Calderon, female   DOB: February 17, 1946, 68 y.o.   MRN: [id number] Andrea Calderon [id number] 1946/01/25 12/23/2013      Progress Note-Follow Up  Subjective  Chief Complaint  Chief Complaint  Patient presents with  . Follow-up    6 month  . Injections    prevnar    HPI  Patient is a 68 year old female in today for routine medical care. Generally doing well although does note it is nearly time for her annual imaging for thyroid nodules. She continues to follow with Dr. Claiborne Billings of GYN at [facility]. No recent illness or concerns. Denies CP/palp/SOB/HA/congestion/fevers/GI or GU c/o. Taking meds as prescribed  Past Medical History  Diagnosis Date  . Diabetes mellitus   . 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 Valvular heart disease 06/13/2012    Cardiologist Dr Darral Dash of [facility] in [facility]  . Multiple thyroid nodules 06/13/2012  . Other and unspecified hyperlipidemia 12/16/2012  . Preventative health care 06/21/2013    Past Surgical History  Procedure Laterality Date  . Colectomy  2009  . Abdominal hysterectomy  2009    total  . Ventral hernia repair  11/11    4 lesions found, mesh left in place    Family History  Problem Relation Age of Onset  . Transient ischemic attack Mother   . Hypertension Mother   . Dementia Mother   . Diabetes Father     borderline sugar  . Coronary artery disease Father   . Heart disease Maternal Grandfather   . Heart disease Paternal Grandfather     History   Social History  . Marital Status: Married    Spouse Name: N/A    Number of Children: N/A  . Years of Education: N/A   Occupational History  . Not on file.   Social History Main Topics  . Smoking status: Never Smoker   . Smokeless tobacco: Never Used  . Alcohol Use: 0.0 oz/week    0 drink(s) per week     Comment: rare, social  . Drug Use: No  . Sexual Activity: Not on file   Other Topics Concern  . Not on file   Social History Narrative  . No narrative  on file    Current Outpatient Prescriptions on File Prior to Visit  Medication Sig Dispense Refill  . B Complex Vitamins (VITAMIN-B COMPLEX PO) Take 1 tablet by mouth daily.      . fluocinonide gel (LIDEX) 1.60 % Apply 1 application topically as needed.      Marland Kitchen glucose blood (ACCU-CHEK AVIVA PLUS) test strip 1 each by Other route 2 (two) times daily. Use as instructed  100 each  0  . hydrochlorothiazide 25 MG tablet Take 25 mg by mouth daily.        Marland Kitchen lisinopril (PRINIVIL,ZESTRIL) 20 MG tablet Take 20 mg by mouth daily.      . rosuvastatin (CRESTOR) 10 MG tablet Take 1 tablet (10 mg total) by mouth daily.  30 tablet  2   No current facility-administered medications on file prior to visit.    Allergies  Allergen Reactions  . Codeine Nausea And Vomiting    Review of Systems  Review of Systems  Constitutional: Negative for fever and malaise/fatigue.  HENT: Negative for congestion.   Eyes: Negative for discharge.  Respiratory: Negative for shortness of breath.   Cardiovascular: Negative for chest pain, palpitations and leg swelling.  Gastrointestinal: Negative for nausea, abdominal pain and diarrhea.  Genitourinary: Negative for dysuria.  Musculoskeletal: Negative for falls.  Skin: Negative for rash.  Neurological: Negative for loss of consciousness and headaches.  Endo/Heme/Allergies: Negative for polydipsia.  Psychiatric/Behavioral: Negative for depression and suicidal ideas. The patient is not nervous/anxious and does not have insomnia.     Objective  BP 120/90  Pulse 65  Temp(Src) 98.5 F (36.9 C) (Oral)  Ht 5\' 1"  (1.549 m)  Wt 153 lb 0.6 oz (69.418 kg)  BMI 28.93 kg/m2  SpO2 98%  Physical Exam  Physical Exam  Constitutional: She is oriented to person, place, and time and well-developed, well-nourished, and in no distress. No distress.  HENT:  Head: Normocephalic and atraumatic.  Eyes: Conjunctivae are normal.  Neck: Neck supple. No thyromegaly present.   Cardiovascular: Normal rate, regular rhythm and normal heart sounds.   No murmur heard. Pulmonary/Chest: Effort normal and breath sounds normal. She has no wheezes.  Abdominal: She exhibits no distension and no mass.  Musculoskeletal: She exhibits no edema.  Lymphadenopathy:    She has no cervical adenopathy.  Neurological: She is alert and oriented to person, place, and time.  Skin: Skin is warm and dry. No rash noted. She is not diaphoretic.  Psychiatric: Memory, affect and judgment normal.    Lab Results  Component Value Date   TSH 2.147 12/15/2013   Lab Results  Component Value Date   WBC 6.2 12/15/2013   HGB 14.6 12/15/2013   HCT 42.8 12/15/2013   MCV 85.8 12/15/2013   PLT 201 12/15/2013   Lab Results  Component Value Date   CREATININE 0.87 12/15/2013   BUN 15 12/15/2013   NA 139 12/15/2013   K 4.9 12/15/2013   CL 99 12/15/2013   CO2 31 12/15/2013   Lab Results  Component Value Date   ALT 16 12/15/2013   AST 25 12/15/2013   ALKPHOS 68 12/15/2013   BILITOT 0.4 12/15/2013   Lab Results  Component Value Date   CHOL 143 12/15/2013   Lab Results  Component Value Date   HDL 55 12/15/2013   Lab Results  Component Value Date   LDLCALC 72 12/15/2013   Lab Results  Component Value Date   TRIG 79 12/15/2013   Lab Results  Component Value Date   CHOLHDL 2.6 12/15/2013     Assessment & Plan  ESSENTIAL HYPERTENSION, BENIGN Well controlled, no changes to meds. Encouraged heart healthy diet such as the DASH diet and exercise as tolerated.   DM hgba1c acceptable, minimize simple carbs. Increase exercise as tolerated. Continue current meds  Other and unspecified hyperlipidemia Encouraged heart healthy diet, increase exercise, avoid trans fats, consider a krill oil cap daily

## 2013-12-23 NOTE — Assessment & Plan Note (Signed)
Well controlled, no changes to meds. Encouraged heart healthy diet such as the DASH diet and exercise as tolerated.  °

## 2013-12-23 NOTE — Assessment & Plan Note (Signed)
hgba1c acceptable, minimize simple carbs. Increase exercise as tolerated. Continue current meds 

## 2013-12-23 NOTE — Assessment & Plan Note (Signed)
Encouraged heart healthy diet, increase exercise, avoid trans fats, consider a krill oil cap daily 

## 2014-01-05 ENCOUNTER — Other Ambulatory Visit: Payer: Self-pay | Admitting: Family Medicine

## 2014-01-07 ENCOUNTER — Encounter: Payer: Self-pay | Admitting: Family Medicine

## 2014-02-15 ENCOUNTER — Telehealth: Payer: Self-pay

## 2014-02-15 DIAGNOSIS — E119 Type 2 diabetes mellitus without complications: Secondary | ICD-10-CM

## 2014-02-15 NOTE — Telephone Encounter (Signed)
Diabetic Bundle- left a detailed message on vm and ordered labs

## 2014-02-23 ENCOUNTER — Encounter: Payer: Self-pay | Admitting: Family Medicine

## 2014-03-28 ENCOUNTER — Other Ambulatory Visit: Payer: Self-pay | Admitting: Family Medicine

## 2014-04-23 ENCOUNTER — Other Ambulatory Visit: Payer: Self-pay | Admitting: Family Medicine

## 2014-04-23 NOTE — Telephone Encounter (Signed)
Rx request to pharmacy/SLS  

## 2014-05-10 ENCOUNTER — Telehealth: Payer: Self-pay | Admitting: Family Medicine

## 2014-05-10 NOTE — Telephone Encounter (Signed)
Caller name: Anajah Relation to pt: self Call back number: [phone] Pharmacy:  Reason for call:   Patient is coming in on 10/9 for medicare wellness labs. Please order labs and patient is requesting A1c to be checked as well

## 2014-05-10 NOTE — Telephone Encounter (Signed)
Please inform pt that we are now doing labs at time of appt

## 2014-05-11 NOTE — Telephone Encounter (Signed)
Informed patient of this.  °

## 2014-05-14 ENCOUNTER — Other Ambulatory Visit (INDEPENDENT_AMBULATORY_CARE_PROVIDER_SITE_OTHER): Payer: Medicare Other

## 2014-05-14 DIAGNOSIS — E089 Diabetes mellitus due to underlying condition without complications: Secondary | ICD-10-CM

## 2014-05-14 DIAGNOSIS — E785 Hyperlipidemia, unspecified: Secondary | ICD-10-CM

## 2014-05-14 LAB — HEMOGLOBIN A1C: HEMOGLOBIN A1C: 6.8 % — AB (ref 4.6–6.5)

## 2014-05-21 ENCOUNTER — Ambulatory Visit (INDEPENDENT_AMBULATORY_CARE_PROVIDER_SITE_OTHER): Payer: Medicare Other | Admitting: Family Medicine

## 2014-05-21 ENCOUNTER — Encounter: Payer: Self-pay | Admitting: Family Medicine

## 2014-05-21 VITALS — BP 132/61 | HR 58 | Temp 98.0°F | Ht 61.0 in | Wt 155.6 lb

## 2014-05-21 DIAGNOSIS — E1149 Type 2 diabetes mellitus with other diabetic neurological complication: Secondary | ICD-10-CM

## 2014-05-21 DIAGNOSIS — Z23 Encounter for immunization: Secondary | ICD-10-CM | POA: Diagnosis not present

## 2014-05-21 DIAGNOSIS — E785 Hyperlipidemia, unspecified: Secondary | ICD-10-CM

## 2014-05-21 DIAGNOSIS — Z0000 Encounter for general adult medical examination without abnormal findings: Secondary | ICD-10-CM | POA: Diagnosis not present

## 2014-05-21 DIAGNOSIS — E1140 Type 2 diabetes mellitus with diabetic neuropathy, unspecified: Secondary | ICD-10-CM | POA: Diagnosis not present

## 2014-05-21 DIAGNOSIS — I10 Essential (primary) hypertension: Secondary | ICD-10-CM | POA: Diagnosis not present

## 2014-05-21 DIAGNOSIS — E1169 Type 2 diabetes mellitus with other specified complication: Secondary | ICD-10-CM

## 2014-05-21 DIAGNOSIS — E663 Overweight: Secondary | ICD-10-CM

## 2014-05-21 LAB — RENAL FUNCTION PANEL
Albumin: 3.7 g/dL (ref 3.5–5.2)
BUN: 16 mg/dL (ref 6–23)
CHLORIDE: 102 meq/L (ref 96–112)
CO2: 30 meq/L (ref 19–32)
Calcium: 9.9 mg/dL (ref 8.4–10.5)
Creatinine, Ser: 0.8 mg/dL (ref 0.4–1.2)
GFR: 76.67 mL/min (ref >60.00)
Glucose, Bld: 90 mg/dL (ref 70–99)
PHOSPHORUS: 3 mg/dL (ref 2.3–4.6)
Potassium: 4.7 mEq/L (ref 3.5–5.1)
SODIUM: 138 meq/L (ref 135–145)

## 2014-05-21 LAB — CBC
HEMATOCRIT: 44.3 % (ref 36.0–46.0)
Hemoglobin: 14.4 g/dL (ref 12.0–15.0)
MCHC: 32.6 g/dL (ref 30.0–36.0)
MCV: 88.9 fl (ref 78.0–100.0)
Platelets: 210 10*3/uL (ref 150.0–400.0)
RBC: 4.98 Mil/uL (ref 3.87–5.11)
RDW: 13.9 % (ref 11.5–15.5)
WBC: 6.2 10*3/uL (ref 4.0–10.5)

## 2014-05-21 LAB — HEPATIC FUNCTION PANEL
ALBUMIN: 3.7 g/dL (ref 3.5–5.2)
ALK PHOS: 71 U/L (ref 39–117)
ALT: 17 U/L (ref 0–35)
AST: 24 U/L (ref 0–37)
BILIRUBIN TOTAL: 0.4 mg/dL (ref 0.2–1.2)
Bilirubin, Direct: 0 mg/dL (ref 0.0–0.3)
Total Protein: 8 g/dL (ref 6.0–8.3)

## 2014-05-21 LAB — LIPID PANEL
CHOLESTEROL: 142 mg/dL (ref 0–200)
HDL: 42.4 mg/dL (ref >39.00)
LDL CALC: 77 mg/dL (ref 0–99)
NONHDL: 99.6
Total CHOL/HDL Ratio: 3
Triglycerides: 114 mg/dL (ref 0.0–149.0)
VLDL: 22.8 mg/dL (ref 0.0–40.0)

## 2014-05-21 LAB — TSH: TSH: 2.12 u[IU]/mL (ref 0.35–4.50)

## 2014-05-21 NOTE — Progress Notes (Signed)
Pre visit review using our clinic review tool, if applicable. No additional management support is needed unless otherwise documented below in the visit note. 

## 2014-05-21 NOTE — Patient Instructions (Signed)

## 2014-05-23 ENCOUNTER — Other Ambulatory Visit: Payer: Self-pay | Admitting: Family Medicine

## 2014-05-26 NOTE — Assessment & Plan Note (Signed)
Encouraged DASH diet, decrease po intake and increase exercise as tolerated. Needs 7-8 hours of sleep nightly. Avoid trans fats, eat small, frequent meals every 4-5 hours with lean proteins, complex carbs and healthy fats. Minimize simple carbs, GMO foods. 

## 2014-05-26 NOTE — Assessment & Plan Note (Signed)
Well controlled, no changes to meds. Encouraged heart healthy diet such as the DASH diet and exercise as tolerated.  °

## 2014-05-26 NOTE — Assessment & Plan Note (Signed)
Patient denies any difficulties at home. No trouble with ADLs, depression or falls. No recent changes to vision or hearing. Is UTD with immunizations. Is UTD with screening. Discussed Advanced Directives, patient agrees to bring [place] copies of documents if can. Encouraged heart healthy diet, exercise as tolerated and adequate sleep. [facility] Dr Suzette Battiest endocrinology [facility] colonoscopy UTD, done in 2009 repeat in 2019 Dr Alice Reichert [facility] Dr [facility] Given flu shot today MGM was May 2015, repeat every one 1-2 years

## 2014-05-26 NOTE — Progress Notes (Signed)
Patient ID: Andrea Calderon, female   DOB: October 23, 1945, 68 y.o.   MRN: [id number] Andrea Calderon [id number] August 04, 1946 05/26/2014      Progress Note-Follow Up  Subjective  Chief Complaint  Chief Complaint  Patient presents with  . Annual Exam    medicare wellness  . Injections    flu    HPI  Patient is a 68 year old female in today for routine medical care. She is doing fairly well his care earlier in the year but they thought she had glaucoma but after numerous visits to multiple eye doctors it has been decided she does not. She's had some low-grade constipation is improved at the moment. She notes her blood sugar this morning was 105. Her numbers typically run 90 12/05/2003. No polyuria or polydipsia. Blood sugars have been up slightly when she takes vitamin C but better otherwise. No recent illness. Denies CP/palp/SOB/HA/congestion/fevers/GI or GU c/o. Taking meds as prescribed  Past Medical History  Diagnosis Date  . Diabetes mellitus   . Oral lichen planus 7/0/0174  . Valvular heart disease 06/13/2012    Cardiologist Dr Darral Dash of [facility] in [facility]  . Multiple thyroid nodules 06/13/2012  . Other and unspecified hyperlipidemia 12/16/2012  . Preventative health care 06/21/2013    Past Surgical History  Procedure Laterality Date  . Colectomy  2009  . Abdominal hysterectomy  2009    total  . Ventral hernia repair  11/11    4 lesions found, mesh left in place    Family History  Problem Relation Age of Onset  . Transient ischemic attack Mother   . Hypertension Mother   . Dementia Mother   . Diabetes Father     borderline sugar  . Coronary artery disease Father   . Heart disease Maternal Grandfather   . Heart disease Paternal Grandfather     History   Social History  . Marital Status: Married    Spouse Name: N/A    Number of Children: N/A  . Years of Education: N/A   Occupational History  . Not on file.   Social History Main Topics  . Smoking  status: Never Smoker   . Smokeless tobacco: Never Used  . Alcohol Use: 0.0 oz/week    0 drink(s) per week     Comment: rare, social  . Drug Use: No  . Sexual Activity: Not on file   Other Topics Concern  . Not on file   Social History Narrative  . No narrative on file    Current Outpatient Prescriptions on File Prior to Visit  Medication Sig Dispense Refill  . aspirin 81 MG tablet Take 81 mg by mouth daily.      . B Complex Vitamins (VITAMIN-B COMPLEX PO) Take 1 tablet by mouth daily.      . fluocinonide gel (LIDEX) 9.44 % Apply 1 application topically as needed.      Marland Kitchen glucose blood (ACCU-CHEK AVIVA PLUS) test strip 1 each by Other route 2 (two) times daily. Use as instructed  100 each  0  . hydrochlorothiazide 25 MG tablet Take 25 mg by mouth daily.        Marland Kitchen lisinopril (PRINIVIL,ZESTRIL) 20 MG tablet Take 20 mg by mouth daily.      Marland Kitchen OVER THE COUNTER MEDICATION Vita Fusion Fiber- 2 daily      . OVER THE COUNTER MEDICATION Stool Softner 2 daily      . OVER THE COUNTER MEDICATION Senacot S prn      .  Probiotic Product (PROBIOTIC DAILY PO) Take 1 capsule by mouth daily.       No current facility-administered medications on file prior to visit.    Allergies  Allergen Reactions  . Codeine Nausea And Vomiting    Review of Systems  Review of Systems  Constitutional: Negative for fever and malaise/fatigue.  HENT: Negative for congestion.   Eyes: Negative for discharge.  Respiratory: Negative for shortness of breath.   Cardiovascular: Negative for chest pain, palpitations and leg swelling.  Gastrointestinal: Negative for nausea, abdominal pain and diarrhea.  Genitourinary: Negative for dysuria.  Musculoskeletal: Negative for falls.  Skin: Negative for rash.  Neurological: Negative for loss of consciousness and headaches.  Endo/Heme/Allergies: Negative for polydipsia.  Psychiatric/Behavioral: Negative for depression and suicidal ideas. The patient is not nervous/anxious and  does not have insomnia.     Objective  BP 132/61  Pulse 58  Temp(Src) 98 F (36.7 C) (Oral)  Ht 5\' 1"  (1.549 m)  Wt 155 lb 9.6 oz (70.58 kg)  BMI 29.42 kg/m2  SpO2 100%  Physical Exam  Physical Exam  Constitutional: She is oriented to person, place, and time and well-developed, well-nourished, and in no distress. No distress.  HENT:  Head: Normocephalic and atraumatic.  Right Ear: External ear normal.  Left Ear: External ear normal.  Nose: Nose normal.  Mouth/Throat: Oropharynx is clear and moist. No oropharyngeal exudate.  Eyes: Conjunctivae are normal. Pupils are equal, round, and reactive to light. Right eye exhibits no discharge. Left eye exhibits no discharge. No scleral icterus.  Neck: Normal range of motion. Neck supple. No thyromegaly present.  Cardiovascular: Normal rate, regular rhythm, normal heart sounds and intact distal pulses.   No murmur heard. Pulmonary/Chest: Effort normal and breath sounds normal. No respiratory distress. She has no wheezes. She has no rales.  Abdominal: Soft. Bowel sounds are normal. She exhibits no distension and no mass. There is no tenderness.  Musculoskeletal: Normal range of motion. She exhibits no edema and no tenderness.  Lymphadenopathy:    She has no cervical adenopathy.  Neurological: She is alert and oriented to person, place, and time. She has normal reflexes. No cranial nerve deficit. Coordination normal.  Skin: Skin is warm and dry. No rash noted. She is not diaphoretic.  Psychiatric: Mood, memory and affect normal.    Lab Results  Component Value Date   TSH 2.12 05/21/2014   Lab Results  Component Value Date   WBC 6.2 05/21/2014   HGB 14.4 05/21/2014   HCT 44.3 05/21/2014   MCV 88.9 05/21/2014   PLT 210.0 05/21/2014   Lab Results  Component Value Date   CREATININE 0.8 05/21/2014   BUN 16 05/21/2014   NA 138 05/21/2014   K 4.7 05/21/2014   CL 102 05/21/2014   CO2 30 05/21/2014   Lab Results  Component Value  Date   ALT 17 05/21/2014   AST 24 05/21/2014   ALKPHOS 71 05/21/2014   BILITOT 0.4 05/21/2014   Lab Results  Component Value Date   CHOL 142 05/21/2014   Lab Results  Component Value Date   HDL 42.40 05/21/2014   Lab Results  Component Value Date   LDLCALC 77 05/21/2014   Lab Results  Component Value Date   TRIG 114.0 05/21/2014   Lab Results  Component Value Date   CHOLHDL 3 05/21/2014     Assessment & Plan  DM (diabetes mellitus), type 2 with neurological complications [id number] acceptable, minimize simple carbs. Increase exercise as tolerated.  Continue current meds  ESSENTIAL HYPERTENSION, BENIGN Well controlled, no changes to meds. Encouraged heart healthy diet such as the DASH diet and exercise as tolerated.   Overweight Encouraged DASH diet, decrease po intake and increase exercise as tolerated. Needs 7-8 hours of sleep nightly. Avoid trans fats, eat small, frequent meals every 4-5 hours with lean proteins, complex carbs and healthy fats. Minimize simple carbs, GMO foods.  Medicare annual wellness visit, subsequent Patient denies any difficulties at home. No trouble with ADLs, depression or falls. No recent changes to vision or hearing. Is UTD with immunizations. Is UTD with screening. Discussed Advanced Directives, patient agrees to bring [place] copies of documents if can. Encouraged heart healthy diet, exercise as tolerated and adequate sleep. [facility] Dr Suzette Battiest endocrinology [facility] colonoscopy UTD, done in 2009 repeat in 2019 Dr Alice Reichert Eye Dr Al[facility] Given flu shot today MGM was May 2015, repeat every one 1-2 years

## 2014-05-26 NOTE — Assessment & Plan Note (Signed)
hgba1c acceptable, minimize simple carbs. Increase exercise as tolerated. Continue current meds 

## 2014-06-09 ENCOUNTER — Other Ambulatory Visit: Payer: Self-pay

## 2014-06-21 ENCOUNTER — Ambulatory Visit
Admission: RE | Admit: 2014-06-21 | Discharge: 2014-06-21 | Disposition: A | Discharge status: Home / Self Care | Payer: Medicare Other | Source: Ambulatory Visit | Attending: Endocrinology | Admitting: Endocrinology

## 2014-06-21 ENCOUNTER — Other Ambulatory Visit: Payer: Self-pay | Admitting: Family Medicine

## 2014-06-21 DIAGNOSIS — E049 Nontoxic goiter, unspecified: Secondary | ICD-10-CM

## 2014-06-21 MED ORDER — ROSUVASTATIN CALCIUM 10 MG PO TABS: 10.0000 mg | ORAL_TABLET | Freq: Every day | ORAL | Status: DC

## 2014-09-02 ENCOUNTER — Encounter: Payer: Self-pay | Admitting: Family Medicine

## 2014-09-02 ENCOUNTER — Other Ambulatory Visit: Payer: Self-pay | Admitting: Family Medicine

## 2014-09-02 MED ORDER — GLUCOSE BLOOD VI STRP: ORAL_STRIP | Status: DC

## 2014-09-02 NOTE — Telephone Encounter (Signed)
Rx sent to the pharmacy by e-script.//AB/CMA 

## 2014-09-02 NOTE — Telephone Encounter (Signed)
Done.//AB/CMA

## 2014-09-02 NOTE — Telephone Encounter (Signed)
Done//AB/CMA

## 2014-09-03 MED ORDER — GLUCOSE BLOOD VI STRP: ORAL_STRIP | Status: DC

## 2014-09-03 NOTE — Telephone Encounter (Signed)
New rx faxed to the pharmacy at [facility] [place].//AB/CMA

## 2014-09-03 NOTE — Addendum Note (Signed)
Addended by: Harl Bowie on: 09/03/2014 05:49 PM   Modules accepted: Orders

## 2014-09-07 NOTE — Telephone Encounter (Signed)
Mickel Baas from [facility] at [facility] states that she needs a different code for this. Best # [phone] needs to be faxed or escript

## 2014-09-08 NOTE — Telephone Encounter (Signed)
Codes E11.69 and E11.40 called in to pharmacy.  No further needs at this time.

## 2014-11-17 ENCOUNTER — Other Ambulatory Visit (INDEPENDENT_AMBULATORY_CARE_PROVIDER_SITE_OTHER): Payer: Medicare Other

## 2014-11-17 DIAGNOSIS — E1169 Type 2 diabetes mellitus with other specified complication: Secondary | ICD-10-CM | POA: Diagnosis not present

## 2014-11-17 DIAGNOSIS — Z0000 Encounter for general adult medical examination without abnormal findings: Secondary | ICD-10-CM

## 2014-11-17 DIAGNOSIS — E785 Hyperlipidemia, unspecified: Secondary | ICD-10-CM

## 2014-11-17 DIAGNOSIS — I10 Essential (primary) hypertension: Secondary | ICD-10-CM

## 2014-11-17 LAB — RENAL FUNCTION PANEL
ALBUMIN: 4.1 g/dL (ref 3.5–5.2)
BUN: 15 mg/dL (ref 6–23)
CO2: 31 mEq/L (ref 19–32)
CREATININE: 0.79 mg/dL (ref 0.40–1.20)
Calcium: 9.8 mg/dL (ref 8.4–10.5)
Chloride: 98 mEq/L (ref 96–112)
GFR: 76.56 mL/min (ref >60.00)
Glucose, Bld: 131 mg/dL — ABNORMAL HIGH (ref 70–99)
PHOSPHORUS: 4 mg/dL (ref 2.3–4.6)
Potassium: 4.1 mEq/L (ref 3.5–5.1)
Sodium: 136 mEq/L (ref 135–145)

## 2014-11-17 LAB — HEPATIC FUNCTION PANEL
ALBUMIN: 4.1 g/dL (ref 3.5–5.2)
ALK PHOS: 73 U/L (ref 39–117)
ALT: 16 U/L (ref 0–35)
AST: 19 U/L (ref 0–37)
BILIRUBIN DIRECT: 0.1 mg/dL (ref 0.0–0.3)
TOTAL PROTEIN: 7.5 g/dL (ref 6.0–8.3)
Total Bilirubin: 0.5 mg/dL (ref 0.2–1.2)

## 2014-11-17 LAB — HEMOGLOBIN A1C: HEMOGLOBIN A1C: 6.8 % — AB (ref 4.6–6.5)

## 2014-11-17 LAB — LIPID PANEL
CHOLESTEROL: 151 mg/dL (ref 0–200)
HDL: 55.4 mg/dL (ref >39.00)
LDL Cholesterol: 76 mg/dL (ref 0–99)
NONHDL: 95.6
Total CHOL/HDL Ratio: 3
Triglycerides: 98 mg/dL (ref 0.0–149.0)
VLDL: 19.6 mg/dL (ref 0.0–40.0)

## 2014-11-17 LAB — CBC
HCT: 43.2 % (ref 36.0–46.0)
Hemoglobin: 14.6 g/dL (ref 12.0–15.0)
MCHC: 33.8 g/dL (ref 30.0–36.0)
MCV: 86.6 fl (ref 78.0–100.0)
PLATELETS: 222 10*3/uL (ref 150.0–400.0)
RBC: 4.99 Mil/uL (ref 3.87–5.11)
RDW: 14.2 % (ref 11.5–15.5)
WBC: 6.7 10*3/uL (ref 4.0–10.5)

## 2014-11-17 LAB — TSH: TSH: 2.86 u[IU]/mL (ref 0.35–4.50)

## 2014-11-23 ENCOUNTER — Encounter: Payer: Self-pay | Admitting: Family Medicine

## 2014-11-23 ENCOUNTER — Ambulatory Visit (INDEPENDENT_AMBULATORY_CARE_PROVIDER_SITE_OTHER): Payer: Medicare Other | Admitting: Family Medicine

## 2014-11-23 VITALS — BP 127/66 | HR 66 | Temp 97.8°F | Ht 62.0 in | Wt 156.1 lb

## 2014-11-23 DIAGNOSIS — L438 Other lichen planus: Secondary | ICD-10-CM

## 2014-11-23 DIAGNOSIS — E785 Hyperlipidemia, unspecified: Secondary | ICD-10-CM

## 2014-11-23 DIAGNOSIS — E1140 Type 2 diabetes mellitus with diabetic neuropathy, unspecified: Secondary | ICD-10-CM

## 2014-11-23 DIAGNOSIS — E1149 Type 2 diabetes mellitus with other diabetic neurological complication: Secondary | ICD-10-CM

## 2014-11-23 DIAGNOSIS — I10 Essential (primary) hypertension: Secondary | ICD-10-CM

## 2014-11-23 DIAGNOSIS — C44310 Basal cell carcinoma of skin of unspecified parts of face: Secondary | ICD-10-CM

## 2014-11-23 DIAGNOSIS — E663 Overweight: Secondary | ICD-10-CM

## 2014-11-23 MED ORDER — LISINOPRIL 20 MG PO TABS: 20.0000 mg | ORAL_TABLET | Freq: Every day | ORAL | Status: DC

## 2014-11-23 MED ORDER — HYDROCHLOROTHIAZIDE 25 MG PO TABS: 25.0000 mg | ORAL_TABLET | Freq: Every day | ORAL | Status: DC

## 2014-11-23 MED ORDER — ROSUVASTATIN CALCIUM 10 MG PO TABS: 10.0000 mg | ORAL_TABLET | Freq: Every day | ORAL | Status: DC

## 2014-11-23 NOTE — Progress Notes (Signed)
Pre visit review using our clinic review tool, if applicable. No additional management support is needed unless otherwise documented below in the visit note. 

## 2014-11-23 NOTE — Patient Instructions (Signed)

## 2014-12-05 ENCOUNTER — Encounter: Payer: Self-pay | Admitting: Family Medicine

## 2014-12-05 DIAGNOSIS — C44310 Basal cell carcinoma of skin of unspecified parts of face: Secondary | ICD-10-CM

## 2014-12-05 DIAGNOSIS — Z85828 Personal history of other malignant neoplasm of skin: Secondary | ICD-10-CM | POA: Insufficient documentation

## 2014-12-05 HISTORY — DX: Basal cell carcinoma of skin of unspecified parts of face: C44.310

## 2014-12-05 NOTE — Assessment & Plan Note (Signed)
hgba1c acceptable, minimize simple carbs. Increase exercise as tolerated. Continue current meds 

## 2014-12-05 NOTE — Assessment & Plan Note (Signed)
Well controlled, no changes to meds. Encouraged heart healthy diet such as the DASH diet and exercise as tolerated.  °

## 2014-12-05 NOTE — Assessment & Plan Note (Signed)
Has been advised to follow up with oral surgeon, Dr Pricilla Handler. Will place referral for insurance purposes

## 2014-12-05 NOTE — Assessment & Plan Note (Signed)
Encouraged DASH diet, decrease po intake and increase exercise as tolerated. Needs 7-8 hours of sleep nightly. Avoid trans fats, eat small, frequent meals every 4-5 hours with lean proteins, complex carbs and healthy fats. Minimize simple carbs 

## 2014-12-05 NOTE — Assessment & Plan Note (Signed)
Tolerating statin, encouraged heart healthy diet, avoid trans fats, minimize simple carbs and saturated fats. Increase exercise as tolerated 

## 2014-12-05 NOTE — Progress Notes (Signed)
Andrea Calderon  [id number] 05/04/1946 12/05/2014      Progress Note-Follow Up  Subjective  Chief Complaint  Chief Complaint  Patient presents with  . Follow-up    HPI  Patient is a 69 y.o. female in today for routine medical care. Patient is in today for follow-up. Overall is feeling well but is concerned about a lesion under her tongue. She saw her dentist last week and they recommended that this be evaluated by an oral surgeon. They have recommended Dr. Jamison Oka. She denies any pain. She denies any recent illness. She has recently had a basal cell carcinoma removed from her nose and has healed well. Denies CP/palp/SOB/HA/congestion/fevers/GI or GU c/o. Taking meds as prescribed  Past Medical History  Diagnosis Date  . Diabetes mellitus   . 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 Valvular heart disease 06/13/2012    Cardiologist Dr Darral Dash of [facility] in [facility]  . Multiple thyroid nodules 06/13/2012  . Other and unspecified hyperlipidemia 12/16/2012  . Preventative health care 06/21/2013  . Cancer     BCC right nose    Past Surgical History  Procedure Laterality Date  . Colectomy  2009  . Abdominal hysterectomy  2009    total  . Ventral hernia repair  11/11    4 lesions found, mesh left in place  . Mohs surgery Right     for [facility] on right nose, Feb 2016    Family History  Problem Relation Age of Onset  . Transient ischemic attack Mother   . Hypertension Mother   . Dementia Mother   . Diabetes Father     borderline sugar  . Coronary artery disease Father   . Heart disease Maternal Grandfather   . Heart disease Paternal Grandfather     History   Social History  . Marital Status: Married    Spouse Name: N/A  . Number of Children: N/A  . Years of Education: N/A   Occupational History  . Not on file.   Social History Main Topics  . Smoking status: Never Smoker   . Smokeless tobacco: Never Used  . Alcohol Use: 0.0 oz/week    0 Standard drinks or  equivalent per week     Comment: rare, social  . Drug Use: No  . Sexual Activity: Not on file   Other Topics Concern  . Not on file   Social History Narrative    Current Outpatient Prescriptions on File Prior to Visit  Medication Sig Dispense Refill  . aspirin 81 MG tablet Take 81 mg by mouth daily.    . B Complex Vitamins (VITAMIN-B COMPLEX PO) Take 1 tablet by mouth daily.    . fluocinonide gel (LIDEX) 6.71 % Apply 1 application topically as needed.    Marland Kitchen glucose blood (ACCU-CHEK AVIVA PLUS) test strip 1 each by Other route 2 (two) times daily. Use as instructed 100 each 0  . glucose blood (ACCU-CHEK AVIVA PLUS) test strip Check blood sugar daily and as needed. 100 each 0  . OVER THE COUNTER MEDICATION Vita Fusion Fiber- 2 daily    . OVER THE COUNTER MEDICATION Stool Softner 2 daily    . OVER THE COUNTER MEDICATION Senacot S prn    . Probiotic Product (PROBIOTIC DAILY PO) Take 1 capsule by mouth daily.     No current facility-administered medications on file prior to visit.    Allergies  Allergen Reactions  . Codeine Nausea And Vomiting    Review of Systems  Review of Systems  Constitutional: Negative for fever and malaise/fatigue.  HENT: Negative for congestion.   Eyes: Negative for discharge.  Respiratory: Negative for shortness of breath.   Cardiovascular: Negative for chest pain, palpitations and leg swelling.  Gastrointestinal: Negative for nausea, abdominal pain and diarrhea.  Genitourinary: Negative for dysuria.  Musculoskeletal: Negative for falls.  Skin: Negative for rash.  Neurological: Negative for loss of consciousness and headaches.  Endo/Heme/Allergies: Negative for polydipsia.  Psychiatric/Behavioral: Negative for depression and suicidal ideas. The patient is not nervous/anxious and does not have insomnia.     Objective  BP 127/66 mmHg  Pulse 66  Temp(Src) 97.8 F (36.6 C) (Oral)  Ht 5\' 2"  (1.575 m)  Wt 156 lb 2 oz (70.818 kg)  BMI 28.55 kg/m2   SpO2 100%  Physical Exam  Physical Exam  Constitutional: She is oriented to person, place, and time and well-developed, well-nourished, and in no distress. No distress.  HENT:  Head: Normocephalic and atraumatic.  Eyes: Conjunctivae are normal.  Neck: Neck supple. No thyromegaly present.  Cardiovascular: Regular rhythm.   Murmur heard. Pulmonary/Chest: Effort normal and breath sounds normal. She has no wheezes.  Abdominal: She exhibits no distension and no mass.  Musculoskeletal: She exhibits no edema.  Lymphadenopathy:    She has no cervical adenopathy.  Neurological: She is alert and oriented to person, place, and time.  Skin: Skin is warm and dry. No rash noted. She is not diaphoretic.  Psychiatric: Memory, affect and judgment normal.    Lab Results  Component Value Date   TSH 2.86 11/17/2014   Lab Results  Component Value Date   WBC 6.7 11/17/2014   HGB 14.6 11/17/2014   HCT 43.2 11/17/2014   MCV 86.6 11/17/2014   PLT 222.0 11/17/2014   Lab Results  Component Value Date   CREATININE 0.79 11/17/2014   BUN 15 11/17/2014   NA 136 11/17/2014   K 4.1 11/17/2014   CL 98 11/17/2014   CO2 31 11/17/2014   Lab Results  Component Value Date   ALT 16 11/17/2014   AST 19 11/17/2014   ALKPHOS 73 11/17/2014   BILITOT 0.5 11/17/2014   Lab Results  Component Value Date   CHOL 151 11/17/2014   Lab Results  Component Value Date   HDL 55.40 11/17/2014   Lab Results  Component Value Date   LDLCALC 76 11/17/2014   Lab Results  Component Value Date   TRIG 98.0 11/17/2014   Lab Results  Component Value Date   CHOLHDL 3 11/17/2014     Assessment & Plan  ESSENTIAL HYPERTENSION, BENIGN Well controlled, no changes to meds. Encouraged heart healthy diet such as the DASH diet and exercise as tolerated.    Oral Lichen Planus Has been advised to follow up with oral surgeon, Dr Pricilla Handler. Will place referral for insurance purposes   DM (diabetes  mellitus), type 2 with neurological complications [id number] acceptable, minimize simple carbs. Increase exercise as tolerated. Continue current meds   Overweight Encouraged DASH diet, decrease po intake and increase exercise as tolerated. Needs 7-8 hours of sleep nightly. Avoid trans fats, eat small, frequent meals every 4-5 hours with lean proteins, complex carbs and healthy fats. Minimize simple carbs.   Hyperlipidemia Tolerating statin, encouraged heart healthy diet, avoid trans fats, minimize simple carbs and saturated fats. Increase exercise as tolerated

## 2015-01-05 HISTORY — PX: TONGUE BIOPSY: SHX1075

## 2015-01-31 ENCOUNTER — Other Ambulatory Visit: Payer: Self-pay

## 2015-03-07 LAB — HM MAMMOGRAPHY: HM MAMMO: NORMAL

## 2015-05-24 ENCOUNTER — Other Ambulatory Visit (INDEPENDENT_AMBULATORY_CARE_PROVIDER_SITE_OTHER): Payer: Medicare Other

## 2015-05-24 DIAGNOSIS — E1149 Type 2 diabetes mellitus with other diabetic neurological complication: Secondary | ICD-10-CM

## 2015-05-24 DIAGNOSIS — I10 Essential (primary) hypertension: Secondary | ICD-10-CM | POA: Diagnosis not present

## 2015-05-24 DIAGNOSIS — E785 Hyperlipidemia, unspecified: Secondary | ICD-10-CM | POA: Diagnosis not present

## 2015-05-24 LAB — COMPREHENSIVE METABOLIC PANEL
ALT: 15 U/L (ref 0–35)
AST: 22 U/L (ref 0–37)
Albumin: 4 g/dL (ref 3.5–5.2)
Alkaline Phosphatase: 66 U/L (ref 39–117)
BILIRUBIN TOTAL: 0.5 mg/dL (ref 0.2–1.2)
BUN: 19 mg/dL (ref 6–23)
CO2: 30 meq/L (ref 19–32)
Calcium: 9.8 mg/dL (ref 8.4–10.5)
Chloride: 100 mEq/L (ref 96–112)
Creatinine, Ser: 0.75 mg/dL (ref 0.40–1.20)
GFR: 81.17 mL/min (ref >60.00)
GLUCOSE: 128 mg/dL — AB (ref 70–99)
Potassium: 4.4 mEq/L (ref 3.5–5.1)
SODIUM: 138 meq/L (ref 135–145)
TOTAL PROTEIN: 7.3 g/dL (ref 6.0–8.3)

## 2015-05-24 LAB — LIPID PANEL
CHOL/HDL RATIO: 3
Cholesterol: 147 mg/dL (ref 0–200)
HDL: 50.9 mg/dL (ref >39.00)
LDL Cholesterol: 73 mg/dL (ref 0–99)
NONHDL: 95.76
Triglycerides: 116 mg/dL (ref 0.0–149.0)
VLDL: 23.2 mg/dL (ref 0.0–40.0)

## 2015-05-24 LAB — CBC
HEMATOCRIT: 41.9 % (ref 36.0–46.0)
HEMOGLOBIN: 14.1 g/dL (ref 12.0–15.0)
MCHC: 33.6 g/dL (ref 30.0–36.0)
MCV: 87.4 fl (ref 78.0–100.0)
Platelets: 205 10*3/uL (ref 150.0–400.0)
RBC: 4.79 Mil/uL (ref 3.87–5.11)
RDW: 14 % (ref 11.5–15.5)
WBC: 6 10*3/uL (ref 4.0–10.5)

## 2015-05-24 LAB — HEMOGLOBIN A1C: HEMOGLOBIN A1C: 6.7 % — AB (ref 4.6–6.5)

## 2015-05-24 LAB — TSH: TSH: 2.7 u[IU]/mL (ref 0.35–4.50)

## 2015-05-30 ENCOUNTER — Telehealth: Payer: Self-pay | Admitting: Behavioral Health

## 2015-05-30 ENCOUNTER — Encounter: Payer: Self-pay | Admitting: Behavioral Health

## 2015-05-30 NOTE — Telephone Encounter (Signed)
Pre-Visit Call completed with patient and chart updated.   Pre-Visit Info documented in Specialty Comments under [employer].    

## 2015-05-31 ENCOUNTER — Ambulatory Visit (INDEPENDENT_AMBULATORY_CARE_PROVIDER_SITE_OTHER): Payer: Medicare Other | Admitting: Family Medicine

## 2015-05-31 ENCOUNTER — Encounter: Payer: Self-pay | Admitting: Family Medicine

## 2015-05-31 VITALS — BP 110/74 | HR 52 | Temp 98.2°F | Ht 62.0 in | Wt 154.4 lb

## 2015-05-31 DIAGNOSIS — E663 Overweight: Secondary | ICD-10-CM

## 2015-05-31 DIAGNOSIS — I10 Essential (primary) hypertension: Secondary | ICD-10-CM

## 2015-05-31 DIAGNOSIS — Z8673 Personal history of transient ischemic attack (TIA), and cerebral infarction without residual deficits: Secondary | ICD-10-CM

## 2015-05-31 DIAGNOSIS — E785 Hyperlipidemia, unspecified: Secondary | ICD-10-CM

## 2015-05-31 DIAGNOSIS — M109 Gout, unspecified: Secondary | ICD-10-CM

## 2015-05-31 DIAGNOSIS — Z0000 Encounter for general adult medical examination without abnormal findings: Secondary | ICD-10-CM

## 2015-05-31 DIAGNOSIS — Z23 Encounter for immunization: Secondary | ICD-10-CM | POA: Diagnosis not present

## 2015-05-31 DIAGNOSIS — E1149 Type 2 diabetes mellitus with other diabetic neurological complication: Secondary | ICD-10-CM | POA: Diagnosis not present

## 2015-05-31 NOTE — Progress Notes (Signed)
Pre visit review using our clinic review tool, if applicable. No additional management support is needed unless otherwise documented below in the visit note. 

## 2015-05-31 NOTE — Patient Instructions (Signed)
Preventive Care for Adults, Female A healthy lifestyle and preventive care can promote health and wellness. Preventive health guidelines for women include the following key practices.  A routine yearly physical is a good way to check with your health care provider about your health and preventive screening. It is a chance to share any concerns and updates on your health and to receive a thorough exam.  Visit your dentist for a routine exam and preventive care every 6 months. Brush your teeth twice a day and floss once a day. Good oral hygiene prevents tooth decay and gum disease.  The frequency of eye exams is based on your age, health, family medical history, use of contact lenses, and other factors. Follow your health care provider's recommendations for frequency of eye exams.  Eat a healthy diet. Foods like vegetables, fruits, whole grains, low-fat dairy products, and lean protein foods contain the nutrients you need without too many calories. Decrease your intake of foods high in solid fats, added sugars, and salt. Eat the right amount of calories for you.Get information about a proper diet from your health care provider, if necessary.  Regular physical exercise is one of the most important things you can do for your health. Most adults should get at least 150 minutes of moderate-intensity exercise (any activity that increases your heart rate and causes you to sweat) each week. In addition, most adults need muscle-strengthening exercises on 2 or more days a week.  Maintain a healthy weight. The body mass index (BMI) is a screening tool to identify possible weight problems. It provides an estimate of body fat based on height and weight. Your health care provider can find your BMI and can help you achieve or maintain a healthy weight.For adults 20 years and older:  A BMI below 18.5 is considered underweight.  A BMI of 18.5 to 24.9 is normal.  A BMI of 25 to 29.9 is considered overweight.  A  BMI of 30 and above is considered obese.  Maintain normal blood lipids and cholesterol levels by exercising and minimizing your intake of saturated fat. Eat a balanced diet with plenty of fruit and vegetables. Blood tests for lipids and cholesterol should begin at age 45 and be repeated every 5 years. If your lipid or cholesterol levels are high, you are over 50, or you are at high risk for heart disease, you may need your cholesterol levels checked more frequently.Ongoing high lipid and cholesterol levels should be treated with medicines if diet and exercise are not working.  If you smoke, find out from your health care provider how to quit. If you do not use tobacco, do not start.  Lung cancer screening is recommended for adults aged 45-80 years who are at high risk for developing lung cancer because of a history of smoking. A yearly low-dose CT scan of the lungs is recommended for people who have at least a 30-pack-year history of smoking and are a current smoker or have quit within the past 15 years. A pack year of smoking is smoking an average of 1 pack of cigarettes a day for 1 year (for example: 1 pack a day for 30 years or 2 packs a day for 15 years). Yearly screening should continue until the smoker has stopped smoking for at least 15 years. Yearly screening should be stopped for people who develop a health problem that would prevent them from having lung cancer treatment.  If you are pregnant, do not drink alcohol. If you are  breastfeeding, be very cautious about drinking alcohol. If you are not pregnant and choose to drink alcohol, do not have more than 1 drink per day. One drink is considered to be 12 ounces (355 mL) of beer, 5 ounces (148 mL) of wine, or 1.5 ounces (44 mL) of liquor.  Avoid use of street drugs. Do not share needles with anyone. Ask for help if you need support or instructions about stopping the use of drugs.  High blood pressure causes heart disease and increases the risk  of stroke. Your blood pressure should be checked at least every 1 to 2 years. Ongoing high blood pressure should be treated with medicines if weight loss and exercise do not work.  If you are 55-79 years old, ask your health care provider if you should take aspirin to prevent strokes.  Diabetes screening is done by taking a blood sample to check your blood glucose level after you have not eaten for a certain period of time (fasting). If you are not overweight and you do not have risk factors for diabetes, you should be screened once every 3 years starting at age 45. If you are overweight or obese and you are 40-70 years of age, you should be screened for diabetes every year as part of your cardiovascular risk assessment.  Breast cancer screening is essential preventive care for women. You should practice "breast self-awareness." This means understanding the normal appearance and feel of your breasts and may include breast self-examination. Any changes detected, no matter how small, should be reported to a health care provider. Women in their 20s and 30s should have a clinical breast exam (CBE) by a health care provider as part of a regular health exam every 1 to 3 years. After age 40, women should have a CBE every year. Starting at age 40, women should consider having a mammogram (breast X-ray test) every year. Women who have a family history of breast cancer should talk to their health care provider about genetic screening. Women at a high risk of breast cancer should talk to their health care providers about having an MRI and a mammogram every year.  Breast cancer gene (BRCA)-related cancer risk assessment is recommended for women who have family members with BRCA-related cancers. BRCA-related cancers include breast, ovarian, tubal, and peritoneal cancers. Having family members with these cancers may be associated with an increased risk for harmful changes (mutations) in the breast cancer genes BRCA1 and  BRCA2. Results of the assessment will determine the need for genetic counseling and BRCA1 and BRCA2 testing.  Your health care provider may recommend that you be screened regularly for cancer of the pelvic organs (ovaries, uterus, and vagina). This screening involves a pelvic examination, including checking for microscopic changes to the surface of your cervix (Pap test). You may be encouraged to have this screening done every 3 years, beginning at age 21.  For women ages 30-65, health care providers may recommend pelvic exams and Pap testing every 3 years, or they may recommend the Pap and pelvic exam, combined with testing for human papilloma virus (HPV), every 5 years. Some types of HPV increase your risk of cervical cancer. Testing for HPV may also be done on women of any age with unclear Pap test results.  Other health care providers may not recommend any screening for nonpregnant women who are considered low risk for pelvic cancer and who do not have symptoms. Ask your health care provider if a screening pelvic exam is right for   you.  If you have had past treatment for cervical cancer or a condition that could lead to cancer, you need Pap tests and screening for cancer for at least 20 years after your treatment. If Pap tests have been discontinued, your risk factors (such as having a new sexual partner) need to be reassessed to determine if screening should resume. Some women have medical problems that increase the chance of getting cervical cancer. In these cases, your health care provider may recommend more frequent screening and Pap tests.  Colorectal cancer can be detected and often prevented. Most routine colorectal cancer screening begins at the age of 50 years and continues through age 75 years. However, your health care provider may recommend screening at an earlier age if you have risk factors for colon cancer. On a yearly basis, your health care provider may provide home test kits to check  for hidden blood in the stool. Use of a small camera at the end of a tube, to directly examine the colon (sigmoidoscopy or colonoscopy), can detect the earliest forms of colorectal cancer. Talk to your health care provider about this at age 50, when routine screening begins. Direct exam of the colon should be repeated every 5-10 years through age 75 years, unless early forms of precancerous polyps or small growths are found.  People who are at an increased risk for hepatitis B should be screened for this virus. You are considered at high risk for hepatitis B if:  You were born in a country where hepatitis B occurs often. Talk with your health care provider about which countries are considered high risk.  Your parents were born in a high-risk country and you have not received a shot to protect against hepatitis B (hepatitis B vaccine).  You have HIV or AIDS.  You use needles to inject street drugs.  You live with, or have sex with, someone who has hepatitis B.  You get hemodialysis treatment.  You take certain medicines for conditions like cancer, organ transplantation, and autoimmune conditions.  Hepatitis C blood testing is recommended for all people born from 1945 through 1965 and any individual with known risks for hepatitis C.  Practice safe sex. Use condoms and avoid high-risk sexual practices to reduce the spread of sexually transmitted infections (STIs). STIs include gonorrhea, chlamydia, syphilis, trichomonas, herpes, HPV, and human immunodeficiency virus (HIV). Herpes, HIV, and HPV are viral illnesses that have no cure. They can result in disability, cancer, and death.  You should be screened for sexually transmitted illnesses (STIs) including gonorrhea and chlamydia if:  You are sexually active and are younger than 24 years.  You are older than 24 years and your health care provider tells you that you are at risk for this type of infection.  Your sexual activity has changed  since you were last screened and you are at an increased risk for chlamydia or gonorrhea. Ask your health care provider if you are at risk.  If you are at risk of being infected with HIV, it is recommended that you take a prescription medicine daily to prevent HIV infection. This is called preexposure prophylaxis (PrEP). You are considered at risk if:  You are sexually active and do not regularly use condoms or know the HIV status of your partner(s).  You take drugs by injection.  You are sexually active with a partner who has HIV.  Talk with your health care provider about whether you are at high risk of being infected with HIV. If   you choose to begin PrEP, you should first be tested for HIV. You should then be tested every 3 months for as long as you are taking PrEP.  Osteoporosis is a disease in which the bones lose minerals and strength with aging. This can result in serious bone fractures or breaks. The risk of osteoporosis can be identified using a bone density scan. Women ages 67 years and over and women at risk for fractures or osteoporosis should discuss screening with their health care providers. Ask your health care provider whether you should take a calcium supplement or vitamin D to reduce the rate of osteoporosis.  Menopause can be associated with physical symptoms and risks. Hormone replacement therapy is available to decrease symptoms and risks. You should talk to your health care provider about whether hormone replacement therapy is right for you.  Use sunscreen. Apply sunscreen liberally and repeatedly throughout the day. You should seek shade when your shadow is shorter than you. Protect yourself by wearing long sleeves, pants, a wide-brimmed hat, and sunglasses year round, whenever you are outdoors.  Once a month, do a whole body skin exam, using a mirror to look at the skin on your back. Tell your health care provider of new moles, moles that have irregular borders, moles that  are larger than a pencil eraser, or moles that have changed in shape or color.  Stay current with required vaccines (immunizations).  Influenza vaccine. All adults should be immunized every year.  Tetanus, diphtheria, and acellular pertussis (Td, Tdap) vaccine. Pregnant women should receive 1 dose of Tdap vaccine during each pregnancy. The dose should be obtained regardless of the length of time since the last dose. Immunization is preferred during the 27th-36th week of gestation. An adult who has not previously received Tdap or who does not know her vaccine status should receive 1 dose of Tdap. This initial dose should be followed by tetanus and diphtheria toxoids (Td) booster doses every 10 years. Adults with an unknown or incomplete history of completing a 3-dose immunization series with Td-containing vaccines should begin or complete a primary immunization series including a Tdap dose. Adults should receive a Td booster every 10 years.  Varicella vaccine. An adult without evidence of immunity to varicella should receive 2 doses or a second dose if she has previously received 1 dose. Pregnant females who do not have evidence of immunity should receive the first dose after pregnancy. This first dose should be obtained before leaving the health care facility. The second dose should be obtained 4-8 weeks after the first dose.  Human papillomavirus (HPV) vaccine. Females aged 13-26 years who have not received the vaccine previously should obtain the 3-dose series. The vaccine is not recommended for use in pregnant females. However, pregnancy testing is not needed before receiving a dose. If a female is found to be pregnant after receiving a dose, no treatment is needed. In that case, the remaining doses should be delayed until after the pregnancy. Immunization is recommended for any person with an immunocompromised condition through the age of 61 years if she did not get any or all doses earlier. During the  3-dose series, the second dose should be obtained 4-8 weeks after the first dose. The third dose should be obtained 24 weeks after the first dose and 16 weeks after the second dose.  Zoster vaccine. One dose is recommended for adults aged 30 years or older unless certain conditions are present.  Measles, mumps, and rubella (MMR) vaccine. Adults born  before 1957 generally are considered immune to measles and mumps. Adults born in 1957 or later should have 1 or more doses of MMR vaccine unless there is a contraindication to the vaccine or there is laboratory evidence of immunity to each of the three diseases. A routine second dose of MMR vaccine should be obtained at least 28 days after the first dose for students attending postsecondary schools, health care workers, or international travelers. People who received inactivated measles vaccine or an unknown type of measles vaccine during 1963-1967 should receive 2 doses of MMR vaccine. People who received inactivated mumps vaccine or an unknown type of mumps vaccine before 1979 and are at high risk for mumps infection should consider immunization with 2 doses of MMR vaccine. For females of childbearing age, rubella immunity should be determined. If there is no evidence of immunity, females who are not pregnant should be vaccinated. If there is no evidence of immunity, females who are pregnant should delay immunization until after pregnancy. Unvaccinated health care workers born before 1957 who lack laboratory evidence of measles, mumps, or rubella immunity or laboratory confirmation of disease should consider measles and mumps immunization with 2 doses of MMR vaccine or rubella immunization with 1 dose of MMR vaccine.  Pneumococcal 13-valent conjugate (PCV13) vaccine. When indicated, a person who is uncertain of his immunization history and has no record of immunization should receive the PCV13 vaccine. All adults 65 years of age and older should receive this  vaccine. An adult aged 19 years or older who has certain medical conditions and has not been previously immunized should receive 1 dose of PCV13 vaccine. This PCV13 should be followed with a dose of pneumococcal polysaccharide (PPSV23) vaccine. Adults who are at high risk for pneumococcal disease should obtain the PPSV23 vaccine at least 8 weeks after the dose of PCV13 vaccine. Adults older than 70 years of age who have normal immune system function should obtain the PPSV23 vaccine dose at least 1 year after the dose of PCV13 vaccine.  Pneumococcal polysaccharide (PPSV23) vaccine. When PCV13 is also indicated, PCV13 should be obtained first. All adults aged 65 years and older should be immunized. An adult younger than age 65 years who has certain medical conditions should be immunized. Any person who resides in a nursing home or long-term care facility should be immunized. An adult smoker should be immunized. People with an immunocompromised condition and certain other conditions should receive both PCV13 and PPSV23 vaccines. People with human immunodeficiency virus (HIV) infection should be immunized as soon as possible after diagnosis. Immunization during chemotherapy or radiation therapy should be avoided. Routine use of PPSV23 vaccine is not recommended for American Indians, [place] Natives, or people younger than 65 years unless there are medical conditions that require PPSV23 vaccine. When indicated, people who have unknown immunization and have no record of immunization should receive PPSV23 vaccine. One-time revaccination 5 years after the first dose of PPSV23 is recommended for people aged 19-64 years who have chronic kidney failure, nephrotic syndrome, asplenia, or immunocompromised conditions. People who received 1-2 doses of PPSV23 before age 65 years should receive another dose of PPSV23 vaccine at age 65 years or later if at least 5 years have passed since the previous dose. Doses of PPSV23 are not  needed for people immunized with PPSV23 at or after age 65 years.  Meningococcal vaccine. Adults with asplenia or persistent complement component deficiencies should receive 2 doses of quadrivalent meningococcal conjugate (MenACWY-D) vaccine. The doses should be obtained   at least 2 months apart. Microbiologists working with certain meningococcal bacteria, [place] recruits, people at risk during an outbreak, and people who travel to or live in countries with a high rate of meningitis should be immunized. A first-year college student up through age 34 years who is living in a residence hall should receive a dose if she did not receive a dose on or after her 16th birthday. Adults who have certain high-risk conditions should receive one or more doses of vaccine.  Hepatitis A vaccine. Adults who wish to be protected from this disease, have certain high-risk conditions, work with hepatitis A-infected animals, work in hepatitis A research labs, or travel to or work in countries with a high rate of hepatitis A should be immunized. Adults who were previously unvaccinated and who anticipate close contact with an international adoptee during the first 60 days after arrival in the [place] from a country with a high rate of hepatitis A should be immunized.  Hepatitis B vaccine. Adults who wish to be protected from this disease, have certain high-risk conditions, may be exposed to blood or other infectious body fluids, are household contacts or sex partners of hepatitis B positive people, are clients or workers in certain care facilities, or travel to or work in countries with a high rate of hepatitis B should be immunized.  Haemophilus influenzae type b (Hib) vaccine. A previously unvaccinated person with asplenia or sickle cell disease or having a scheduled splenectomy should receive 1 dose of Hib vaccine. Regardless of previous immunization, a recipient of a hematopoietic stem cell transplant should receive a  3-dose series 6-12 months after her successful transplant. Hib vaccine is not recommended for adults with HIV infection. Preventive Services / Frequency Ages 35 to 4 years  Blood pressure check.** / Every 3-5 years.  Lipid and cholesterol check.** / Every 5 years beginning at age 60.  Clinical breast exam.** / Every 3 years for women in their 71s and 10s.  BRCA-related cancer risk assessment.** / For women who have family members with a BRCA-related cancer (breast, ovarian, tubal, or peritoneal cancers).  Pap test.** / Every 2 years from ages 76 through 26. Every 3 years starting at age 61 through age 76 or 93 with a history of 3 consecutive normal Pap tests.  HPV screening.** / Every 3 years from ages 37 through ages 60 to 51 with a history of 3 consecutive normal Pap tests.  Hepatitis C blood test.** / For any individual with known risks for hepatitis C.  Skin self-exam. / Monthly.  Influenza vaccine. / Every year.  Tetanus, diphtheria, and acellular pertussis (Tdap, Td) vaccine.** / Consult your health care provider. Pregnant women should receive 1 dose of Tdap vaccine during each pregnancy. 1 dose of Td every 10 years.  Varicella vaccine.** / Consult your health care provider. Pregnant females who do not have evidence of immunity should receive the first dose after pregnancy.  HPV vaccine. / 3 doses over 6 months, if 93 and younger. The vaccine is not recommended for use in pregnant females. However, pregnancy testing is not needed before receiving a dose.  Measles, mumps, rubella (MMR) vaccine.** / You need at least 1 dose of MMR if you were born in 1957 or later. You may also need a 2nd dose. For females of childbearing age, rubella immunity should be determined. If there is no evidence of immunity, females who are not pregnant should be vaccinated. If there is no evidence of immunity, females who are  pregnant should delay immunization until after pregnancy.  Pneumococcal  13-valent conjugate (PCV13) vaccine.** / Consult your health care provider.  Pneumococcal polysaccharide (PPSV23) vaccine.** / 1 to 2 doses if you smoke cigarettes or if you have certain conditions.  Meningococcal vaccine.** / 1 dose if you are age 68 to 8 years and a Market researcher living in a residence hall, or have one of several medical conditions, you need to get vaccinated against meningococcal disease. You may also need additional booster doses.  Hepatitis A vaccine.** / Consult your health care provider.  Hepatitis B vaccine.** / Consult your health care provider.  Haemophilus influenzae type b (Hib) vaccine.** / Consult your health care provider. Ages 7 to 53 years  Blood pressure check.** / Every year.  Lipid and cholesterol check.** / Every 5 years beginning at age 25 years.  Lung cancer screening. / Every year if you are aged 11-80 years and have a 30-pack-year history of smoking and currently smoke or have quit within the past 15 years. Yearly screening is stopped once you have quit smoking for at least 15 years or develop a health problem that would prevent you from having lung cancer treatment.  Clinical breast exam.** / Every year after age 48 years.  BRCA-related cancer risk assessment.** / For women who have family members with a BRCA-related cancer (breast, ovarian, tubal, or peritoneal cancers).  Mammogram.** / Every year beginning at age 41 years and continuing for as long as you are in good health. Consult with your health care provider.  Pap test.** / Every 3 years starting at age 65 years through age 37 or 70 years with a history of 3 consecutive normal Pap tests.  HPV screening.** / Every 3 years from ages 72 years through ages 60 to 40 years with a history of 3 consecutive normal Pap tests.  Fecal occult blood test (FOBT) of stool. / Every year beginning at age 21 years and continuing until age 5 years. You may not need to do this test if you get  a colonoscopy every 10 years.  Flexible sigmoidoscopy or colonoscopy.** / Every 5 years for a flexible sigmoidoscopy or every 10 years for a colonoscopy beginning at age 35 years and continuing until age 48 years.  Hepatitis C blood test.** / For all people born from [birth year] through 1965 and any individual with known risks for hepatitis C.  Skin self-exam. / Monthly.  Influenza vaccine. / Every year.  Tetanus, diphtheria, and acellular pertussis (Tdap/Td) vaccine.** / Consult your health care provider. Pregnant women should receive 1 dose of Tdap vaccine during each pregnancy. 1 dose of Td every 10 years.  Varicella vaccine.** / Consult your health care provider. Pregnant females who do not have evidence of immunity should receive the first dose after pregnancy.  Zoster vaccine.** / 1 dose for adults aged 30 years or older.  Measles, mumps, rubella (MMR) vaccine.** / You need at least 1 dose of MMR if you were born in 1957 or later. You may also need a second dose. For females of childbearing age, rubella immunity should be determined. If there is no evidence of immunity, females who are not pregnant should be vaccinated. If there is no evidence of immunity, females who are pregnant should delay immunization until after pregnancy.  Pneumococcal 13-valent conjugate (PCV13) vaccine.** / Consult your health care provider.  Pneumococcal polysaccharide (PPSV23) vaccine.** / 1 to 2 doses if you smoke cigarettes or if you have certain conditions.  Meningococcal vaccine.** /  Consult your health care provider.  Hepatitis A vaccine.** / Consult your health care provider.  Hepatitis B vaccine.** / Consult your health care provider.  Haemophilus influenzae type b (Hib) vaccine.** / Consult your health care provider. Ages 64 years and over  Blood pressure check.** / Every year.  Lipid and cholesterol check.** / Every 5 years beginning at age 23 years.  Lung cancer screening. / Every year if you  are aged 16-80 years and have a 30-pack-year history of smoking and currently smoke or have quit within the past 15 years. Yearly screening is stopped once you have quit smoking for at least 15 years or develop a health problem that would prevent you from having lung cancer treatment.  Clinical breast exam.** / Every year after age 74 years.  BRCA-related cancer risk assessment.** / For women who have family members with a BRCA-related cancer (breast, ovarian, tubal, or peritoneal cancers).  Mammogram.** / Every year beginning at age 44 years and continuing for as long as you are in good health. Consult with your health care provider.  Pap test.** / Every 3 years starting at age 58 years through age 22 or 39 years with 3 consecutive normal Pap tests. Testing can be stopped between 65 and 70 years with 3 consecutive normal Pap tests and no abnormal Pap or HPV tests in the past 10 years.  HPV screening.** / Every 3 years from ages 64 years through ages 70 or 61 years with a history of 3 consecutive normal Pap tests. Testing can be stopped between 65 and 70 years with 3 consecutive normal Pap tests and no abnormal Pap or HPV tests in the past 10 years.  Fecal occult blood test (FOBT) of stool. / Every year beginning at age 40 years and continuing until age 27 years. You may not need to do this test if you get a colonoscopy every 10 years.  Flexible sigmoidoscopy or colonoscopy.** / Every 5 years for a flexible sigmoidoscopy or every 10 years for a colonoscopy beginning at age 7 years and continuing until age 32 years.  Hepatitis C blood test.** / For all people born from [birth year] through 1966 and any individual with known risks for hepatitis C.  Osteoporosis screening.** / A one-time screening for women ages 30 years and over and women at risk for fractures or osteoporosis.  Skin self-exam. / Monthly.  Influenza vaccine. / Every year.  Tetanus, diphtheria, and acellular pertussis (Tdap/Td)  vaccine.** / 1 dose of Td every 10 years.  Varicella vaccine.** / Consult your health care provider.  Zoster vaccine.** / 1 dose for adults aged 35 years or older.  Pneumococcal 13-valent conjugate (PCV13) vaccine.** / Consult your health care provider.  Pneumococcal polysaccharide (PPSV23) vaccine.** / 1 dose for all adults aged 46 years and older.  Meningococcal vaccine.** / Consult your health care provider.  Hepatitis A vaccine.** / Consult your health care provider.  Hepatitis B vaccine.** / Consult your health care provider.  Haemophilus influenzae type b (Hib) vaccine.** / Consult your health care provider. ** Family history and personal history of risk and conditions may change your health care provider's recommendations.   This information is not intended to replace advice given to you by your health care provider. Make sure you discuss any questions you have with your health care provider.   Document Released: 09/18/2001 Document Revised: 08/13/2014 Document Reviewed: 12/18/2010 Elsevier Interactive Patient Education [employer].

## 2015-06-12 ENCOUNTER — Encounter: Payer: Self-pay | Admitting: Family Medicine

## 2015-06-12 NOTE — Assessment & Plan Note (Signed)
Well controlled, no changes to meds. Encouraged heart healthy diet such as the DASH diet and exercise as tolerated.  °

## 2015-06-12 NOTE — Assessment & Plan Note (Signed)
Tolerating statin, encouraged heart healthy diet, avoid trans fats, minimize simple carbs and saturated fats. Increase exercise as tolerated 

## 2015-06-12 NOTE — Assessment & Plan Note (Signed)
hgba1c acceptable, minimize simple carbs. Increase exercise as tolerated. Continue current meds 

## 2015-06-12 NOTE — Assessment & Plan Note (Signed)
Doing well, continue aspirin and manage risk factors

## 2015-06-12 NOTE — Assessment & Plan Note (Signed)
Patient denies any difficulties at home. No trouble with ADLs, depression or falls. No recent changes to vision or hearing. Is UTD with immunizations. Is UTD with screening. Discussed Advanced Directives, patient agrees to bring [place] copies of documents if can. Encouraged heart healthy diet, exercise as tolerated and adequate sleep Allergies verified: UTD  Immunization Status: Flu vaccine-- 05/21/14 Tdap-- 09/11/11 PNA-- 12/21/13 Shingles-- 06/06/13  A/P:  Changes to [place], [place] or Personal Hx: UTD Pap-- 12/05/13 w/ Dr. Rayford Halsted at [facility]; normal; patient reported MMG-- 03/07/15 w/ [facility]; normal; patient reported Bone Density-- 09/13/10 w/ [facility]; [place] Spine L1-L4 (T-score 1.2) & Left Femur Neck (T-score 1.6) CCS-- 08/06/12 w/ Dr. Casey Burkitt at [facility]; no polyps found; follow-up in 5 years; patient reported.  Care Teams Updated:  Dr. Rayford Halsted - Gynecology  Dr. Casey Burkitt - Gastroenterology  Dr. Ricky Ala - Cardiology See problem list for risk factors See AVS for preventative healthcare recommendations.

## 2015-06-12 NOTE — Progress Notes (Signed)
Subjective:    Patient ID: Andrea Calderon, female    DOB: 10-May-1946, 69 y.o.   MRN: [id number]  Chief Complaint  Patient presents with  . Medicare Wellness    HPI Patient is in today for annual exam. Doing fairly well. Had an appointment at [facility] yesterday for follow-up on her basal cell carcinoma and was given a good report. No recent illness or acute complaints. Did have a lesion on her tongue recently which was found to be benign. No polyuria or polydipsia. Denies CP/palp/SOB/HA/congestion/fevers/GI or GU c/o. Taking meds as prescribed  Past Medical History  Diagnosis Date  . Diabetes mellitus   . 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 Valvular heart disease 06/13/2012    Cardiologist Dr Darral Dash of [facility] in [facility]  . Multiple thyroid nodules 06/13/2012  . Other and unspecified hyperlipidemia 12/16/2012  . Preventative health care 06/21/2013  . Cancer (HCC)     BCC right nose  . BCC (basal cell carcinoma), face 12/05/2014    Removed from right side of nose via [facility] Sees Dr Altamese Cabal and Dr Ferdinand Lango  . H/O: stroke 09/11/2011    September 2012 acute right retinal artery occlusion H/o left retinal artery embolic event by imaging H/o blood clot in port during chemotherapy     Past Surgical History  Procedure Laterality Date  . Colectomy  2009  . Abdominal hysterectomy  2009    total  . Ventral hernia repair  11/11    4 lesions found, mesh left in place  . Mohs surgery Right     for [facility] on right nose, Feb 2016  . Tongue biopsy  01/05/15    negative results    Family History  Problem Relation Age of Onset  . Transient ischemic attack Mother   . Hypertension Mother   . Dementia Mother   . Diabetes Father     borderline sugar  . Coronary artery disease Father   . Heart disease Maternal Grandfather   . Heart disease Paternal Grandfather     Social History   Social History  . Marital Status: Married    Spouse Name: N/A  .  Number of Children: N/A  . Years of Education: N/A   Occupational History  . Not on file.   Social History Main Topics  . Smoking status: Never Smoker   . Smokeless tobacco: Never Used  . Alcohol Use: 0.0 oz/week    0 Standard drinks or equivalent per week     Comment: rare, social  . Drug Use: No  . Sexual Activity: Not on file   Other Topics Concern  . Not on file   Social History Narrative    Outpatient Prescriptions Prior to Visit  Medication Sig Dispense Refill  . aspirin 81 MG tablet Take 81 mg by mouth daily.    . B Complex Vitamins (VITAMIN-B COMPLEX PO) Take 1 tablet by mouth daily.    Marland Kitchen glucose blood (ACCU-CHEK AVIVA PLUS) test strip 1 each by Other route 2 (two) times daily. Use as instructed 100 each 0  . hydrochlorothiazide (HYDRODIURIL) 25 MG tablet Take 1 tablet (25 mg total) by mouth daily. 90 tablet 3  . lisinopril (PRINIVIL,ZESTRIL) 20 MG tablet Take 1 tablet (20 mg total) by mouth daily. 90 tablet 3  . OVER THE COUNTER MEDICATION Vita Fusion Fiber- 2 daily    . OVER THE COUNTER MEDICATION Stool Softner 2 daily    . Probiotic  Product (PROBIOTIC DAILY PO) Take 1 capsule by mouth daily.    . rosuvastatin (CRESTOR) 10 MG tablet Take 1 tablet (10 mg total) by mouth daily. 90 tablet 3   No facility-administered medications prior to visit.    Allergies  Allergen Reactions  . Codeine Nausea And Vomiting    Review of Systems  Constitutional: Negative for fever and malaise/fatigue.  HENT: Negative for congestion.   Eyes: Negative for discharge.  Respiratory: Negative for shortness of breath.   Cardiovascular: Negative for chest pain, palpitations and leg swelling.  Gastrointestinal: Negative for nausea and abdominal pain.  Genitourinary: Negative for dysuria.  Musculoskeletal: Negative for falls.  Skin: Negative for rash.  Neurological: Negative for loss of consciousness and headaches.  Endo/Heme/Allergies: Negative for environmental allergies.    Psychiatric/Behavioral: Negative for depression. The patient is not nervous/anxious.        Objective:    Physical Exam  Constitutional: She is oriented to person, place, and time. She appears well-developed and well-nourished. No distress.  HENT:  Head: Normocephalic and atraumatic.  Eyes: Conjunctivae are normal.  Neck: Neck supple. No thyromegaly present.  Cardiovascular: Normal rate, regular rhythm and normal heart sounds.   No murmur heard. Pulmonary/Chest: Effort normal and breath sounds normal. No respiratory distress.  Abdominal: Soft. Bowel sounds are normal. She exhibits no distension and no mass. There is no tenderness.  Musculoskeletal: She exhibits no edema.  Lymphadenopathy:    She has no cervical adenopathy.  Neurological: She is alert and oriented to person, place, and time.  Skin: Skin is warm and dry.  Psychiatric: She has a normal mood and affect. Her behavior is normal.    BP 110/74 mmHg  Pulse 52  Temp(Src) 98.2 F (36.8 C) (Oral)  Ht 5\' 2"  (1.575 m)  Wt 154 lb 6 oz (70.024 kg)  BMI 28.23 kg/m2  SpO2 96% Wt Readings from Last 3 Encounters:  05/31/15 154 lb 6 oz (70.024 kg)  11/23/14 156 lb 2 oz (70.818 kg)  05/21/14 155 lb 9.6 oz (70.58 kg)     Lab Results  Component Value Date   WBC 6.0 05/24/2015   HGB 14.1 05/24/2015   HCT 41.9 05/24/2015   PLT 205.0 05/24/2015   GLUCOSE 128* 05/24/2015   CHOL 147 05/24/2015   TRIG 116.0 05/24/2015   HDL 50.90 05/24/2015   LDLDIRECT 182.4 09/11/2011   LDLCALC 73 05/24/2015   ALT 15 05/24/2015   AST 22 05/24/2015   NA 138 05/24/2015   K 4.4 05/24/2015   CL 100 05/24/2015   CREATININE 0.75 05/24/2015   BUN 19 05/24/2015   CO2 30 05/24/2015   TSH 2.70 05/24/2015   INR 0.9 09/11/2011   HGBA1C 6.7* 05/24/2015    Lab Results  Component Value Date   TSH 2.70 05/24/2015   Lab Results  Component Value Date   WBC 6.0 05/24/2015   HGB 14.1 05/24/2015   HCT 41.9 05/24/2015   MCV 87.4 05/24/2015    PLT 205.0 05/24/2015   Lab Results  Component Value Date   NA 138 05/24/2015   K 4.4 05/24/2015   CO2 30 05/24/2015   GLUCOSE 128* 05/24/2015   BUN 19 05/24/2015   CREATININE 0.75 05/24/2015   BILITOT 0.5 05/24/2015   ALKPHOS 66 05/24/2015   AST 22 05/24/2015   ALT 15 05/24/2015   PROT 7.3 05/24/2015   ALBUMIN 4.0 05/24/2015   CALCIUM 9.8 05/24/2015   GFR 81.17 05/24/2015   Lab Results  Component Value Date  CHOL 147 05/24/2015   Lab Results  Component Value Date   HDL 50.90 05/24/2015   Lab Results  Component Value Date   LDLCALC 73 05/24/2015   Lab Results  Component Value Date   TRIG 116.0 05/24/2015   Lab Results  Component Value Date   CHOLHDL 3 05/24/2015   Lab Results  Component Value Date   HGBA1C 6.7* 05/24/2015       Assessment & Plan:   Problem List Items Addressed This Visit    Overweight   Relevant Orders   TSH   CBC   Hemoglobin A1c   Lipid panel   Comprehensive metabolic panel   Uric acid   Medicare annual wellness visit, subsequent    Patient denies any difficulties at home. No trouble with ADLs, depression or falls. No recent changes to vision or hearing. Is UTD with immunizations. Is UTD with screening. Discussed Advanced Directives, patient agrees to bring [place] copies of documents if can. Encouraged heart healthy diet, exercise as tolerated and adequate sleep Allergies verified: UTD  Immunization Status: Flu vaccine-- 05/21/14 Tdap-- 09/11/11 PNA-- 12/21/13 Shingles-- 06/06/13  A/P:  Changes to [place], [place] or Personal Hx: UTD Pap-- 12/05/13 w/ Dr. Rayford Halsted at [facility] normal; patient reported MMG-- 03/07/15 w/ [facility]; normal; patient reported Bone Density-- 09/13/10 w/ [facility]; [place] Spine L1-L4 (T-score 1.2) & Left Femur Neck (T-score 1.6) CCS-- 08/06/12 w/ Dr[facility] Casey Burkitt [facility]; no polyps found; follow-up in 5 years; patient reported.  Care Teams  Updated:  Dr. Rayford Halsted - Gynecology  Dr. Casey Burkitt - Gastroenterology  Dr. Ricky Ala - Cardiology See problem list for risk factors See AVS for preventative healthcare recommendations.       Hyperlipidemia    Tolerating statin, encouraged heart healthy diet, avoid trans fats, minimize simple carbs and saturated fats. Increase exercise as tolerated      Relevant Orders   TSH   CBC   Hemoglobin A1c   Lipid panel   Comprehensive metabolic panel   Uric acid   H/O: stroke    Doing well, continue aspirin and manage risk factors       GOUT, UNSPECIFIED   Relevant Orders   TSH   CBC   Hemoglobin A1c   Lipid panel   Comprehensive metabolic panel   Uric acid   ESSENTIAL HYPERTENSION, BENIGN    Well controlled, no changes to meds. Encouraged heart healthy diet such as the DASH diet and exercise as tolerated.       Relevant Orders   TSH   CBC   Hemoglobin A1c   Lipid panel   Comprehensive metabolic panel   Uric acid   DM (diabetes mellitus), type 2 with neurological complications (HCC)    [id number] acceptable, minimize simple carbs. Increase exercise as tolerated. Continue current meds      Relevant Orders   TSH   CBC   Hemoglobin A1c   Lipid panel   Comprehensive metabolic panel   Uric acid    Other Visit Diagnoses    Encounter for immunization    -  Primary    Need for prophylactic vaccination against Streptococcus pneumoniae (pneumococcus)        Relevant Orders    Pneumococcal polysaccharide vaccine 23-valent greater than or equal to 2yo subcutaneous/IM (Completed)       I am having Ms. Bogosian maintain her B Complex Vitamins (VITAMIN-B COMPLEX PO), aspirin, OVER THE  COUNTER MEDICATION, OVER THE COUNTER MEDICATION, Probiotic Product (PROBIOTIC DAILY PO), glucose blood, rosuvastatin, lisinopril, and hydrochlorothiazide.  No orders of the defined types were placed in this encounter.     Penni Homans, MD

## 2015-07-21 ENCOUNTER — Encounter: Payer: Self-pay | Admitting: Family Medicine

## 2015-07-21 ENCOUNTER — Other Ambulatory Visit: Payer: Self-pay | Admitting: Family Medicine

## 2015-07-21 MED ORDER — ROSUVASTATIN CALCIUM 10 MG PO TABS: 10.0000 mg | ORAL_TABLET | Freq: Every day | ORAL | Status: DC

## 2015-07-21 MED ORDER — LISINOPRIL 20 MG PO TABS: 20.0000 mg | ORAL_TABLET | Freq: Every day | ORAL | Status: DC

## 2015-07-21 MED ORDER — HYDROCHLOROTHIAZIDE 25 MG PO TABS: 25.0000 mg | ORAL_TABLET | Freq: Every day | ORAL | Status: DC

## 2015-12-11 ENCOUNTER — Encounter: Payer: Self-pay | Admitting: Family Medicine

## 2015-12-13 ENCOUNTER — Other Ambulatory Visit: Payer: Self-pay | Admitting: Family Medicine

## 2015-12-13 DIAGNOSIS — M1000 Idiopathic gout, unspecified site: Secondary | ICD-10-CM

## 2015-12-15 ENCOUNTER — Other Ambulatory Visit: Payer: Self-pay

## 2015-12-16 ENCOUNTER — Other Ambulatory Visit (INDEPENDENT_AMBULATORY_CARE_PROVIDER_SITE_OTHER): Payer: Medicare Other

## 2015-12-16 DIAGNOSIS — E1149 Type 2 diabetes mellitus with other diabetic neurological complication: Secondary | ICD-10-CM

## 2015-12-16 DIAGNOSIS — M109 Gout, unspecified: Secondary | ICD-10-CM | POA: Diagnosis not present

## 2015-12-16 DIAGNOSIS — M1000 Idiopathic gout, unspecified site: Secondary | ICD-10-CM

## 2015-12-16 DIAGNOSIS — E663 Overweight: Secondary | ICD-10-CM

## 2015-12-16 DIAGNOSIS — I10 Essential (primary) hypertension: Secondary | ICD-10-CM

## 2015-12-16 DIAGNOSIS — E785 Hyperlipidemia, unspecified: Secondary | ICD-10-CM

## 2015-12-16 LAB — LIPID PANEL
CHOL/HDL RATIO: 4
CHOLESTEROL: 152 mg/dL (ref 0–200)
HDL: 43.1 mg/dL (ref >39.00)
LDL CALC: 78 mg/dL (ref 0–99)
NONHDL: 108.49
TRIGLYCERIDES: 153 mg/dL — AB (ref 0.0–149.0)
VLDL: 30.6 mg/dL (ref 0.0–40.0)

## 2015-12-16 LAB — COMPREHENSIVE METABOLIC PANEL
ALT: 18 U/L (ref 0–35)
AST: 24 U/L (ref 0–37)
Albumin: 4.4 g/dL (ref 3.5–5.2)
Alkaline Phosphatase: 66 U/L (ref 39–117)
BUN: 17 mg/dL (ref 6–23)
CALCIUM: 9.9 mg/dL (ref 8.4–10.5)
CO2: 30 meq/L (ref 19–32)
CREATININE: 0.78 mg/dL (ref 0.40–1.20)
Chloride: 97 mEq/L (ref 96–112)
GFR: 77.46 mL/min (ref >60.00)
GLUCOSE: 134 mg/dL — AB (ref 70–99)
Potassium: 4.5 mEq/L (ref 3.5–5.1)
Sodium: 135 mEq/L (ref 135–145)
Total Bilirubin: 0.6 mg/dL (ref 0.2–1.2)
Total Protein: 7.6 g/dL (ref 6.0–8.3)

## 2015-12-16 LAB — CBC
HCT: 43.2 % (ref 36.0–46.0)
HEMOGLOBIN: 14.5 g/dL (ref 12.0–15.0)
MCHC: 33.6 g/dL (ref 30.0–36.0)
MCV: 85.9 fl (ref 78.0–100.0)
PLATELETS: 229 10*3/uL (ref 150.0–400.0)
RBC: 5.03 Mil/uL (ref 3.87–5.11)
RDW: 14.8 % (ref 11.5–15.5)
WBC: 6.7 10*3/uL (ref 4.0–10.5)

## 2015-12-16 LAB — URIC ACID: Uric Acid, Serum: 6.7 mg/dL (ref 2.4–7.0)

## 2015-12-16 LAB — TSH: TSH: 2.49 u[IU]/mL (ref 0.35–4.50)

## 2015-12-16 LAB — HEMOGLOBIN A1C: Hgb A1c MFr Bld: 7.1 % — ABNORMAL HIGH (ref 4.6–6.5)

## 2015-12-22 ENCOUNTER — Ambulatory Visit (INDEPENDENT_AMBULATORY_CARE_PROVIDER_SITE_OTHER): Payer: Medicare Other | Admitting: Family Medicine

## 2015-12-22 ENCOUNTER — Encounter: Payer: Self-pay | Admitting: Family Medicine

## 2015-12-22 VITALS — BP 112/72 | HR 67 | Temp 98.1°F | Ht 62.0 in | Wt 152.1 lb

## 2015-12-22 DIAGNOSIS — E042 Nontoxic multinodular goiter: Secondary | ICD-10-CM | POA: Diagnosis not present

## 2015-12-22 DIAGNOSIS — Z0000 Encounter for general adult medical examination without abnormal findings: Secondary | ICD-10-CM

## 2015-12-22 DIAGNOSIS — I10 Essential (primary) hypertension: Secondary | ICD-10-CM

## 2015-12-22 DIAGNOSIS — M109 Gout, unspecified: Secondary | ICD-10-CM

## 2015-12-22 DIAGNOSIS — E1149 Type 2 diabetes mellitus with other diabetic neurological complication: Secondary | ICD-10-CM

## 2015-12-22 DIAGNOSIS — E663 Overweight: Secondary | ICD-10-CM

## 2015-12-22 DIAGNOSIS — Z1159 Encounter for screening for other viral diseases: Secondary | ICD-10-CM

## 2015-12-22 DIAGNOSIS — E785 Hyperlipidemia, unspecified: Secondary | ICD-10-CM

## 2015-12-22 NOTE — Progress Notes (Signed)
Pre visit review using our clinic review tool, if applicable. No additional management support is needed unless otherwise documented below in the visit note. 

## 2015-12-22 NOTE — Assessment & Plan Note (Signed)
Recent flare in left great toe feels better now. Uric acid 6.7, she is encouraged to increase hydration and avoid offending foods.

## 2015-12-22 NOTE — Assessment & Plan Note (Signed)
Well controlled, no changes to meds. Encouraged heart healthy diet such as the DASH diet and exercise as tolerated.  °

## 2015-12-22 NOTE — Progress Notes (Signed)
Subjective:    Patient ID: Andrea Calderon, female    DOB: 1946/04/28, 70 y.o.   MRN: [id number]  Chief Complaint  Patient presents with  . Follow-up    HPI Patient is in today for follow up.   Patient reports her blood sugars in the morning are about 120-130 and they have usually been trending thought the day.  Patient also reports she has been seeing an eye doctor and there has been an increase pressure in her eye so she has been using steroid eye drops to help and since last visit with Dr. Larose Kells her eye vision has improved, patient also report a gout flare up on big toe that was a throbbing pain during the night, patients uric acid levels were normal but in the higher end range of normal recommend patient increase water intake and we will continue to monitor.  Patient also reports a spot ion left leg were she had gotten a tick bite like last July and it was itching and calm down but still has been having some concerns with the itching and redness. Denies CP/palp/SOB/HA/congestion/fevers/GI or GU c/o. Taking meds as prescribed.     Past Medical History  Diagnosis Date  . Diabetes mellitus   . 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 Valvular heart disease 06/13/2012    Cardiologist Dr Darral Dash of [facility] in [facility]  . Multiple thyroid nodules 06/13/2012  . Other and unspecified hyperlipidemia 12/16/2012  . Preventative health care 06/21/2013  . Cancer (HCC)     BCC right nose  . BCC (basal cell carcinoma), face 12/05/2014    [facility] from right side of [facility] Altamese Cabal and Dr Ferdinand Lango  . H/O: stroke 09/11/2011    September 2012 acute right retinal artery occlusion H/o left retinal artery embolic event by imaging H/o blood clot in port during chemotherapy     Past Surgical History  Procedure Laterality Date  . Colectomy  2009  . Abdominal hysterectomy  2009    total  . Ventral hernia repair  11/11    4 lesions found, mesh left in place  . Mohs  surgery Right     for [facility] on right nose, Feb 2016  . Tongue biopsy  01/05/15    negative results    Family History  Problem Relation Age of Onset  . Transient ischemic attack Mother   . Hypertension Mother   . Dementia Mother   . Diabetes Father     borderline sugar  . Coronary artery disease Father   . Heart disease Maternal Grandfather   . Heart disease Paternal Grandfather     Social History   Social History  . Marital Status: Married    Spouse Name: N/A  . Number of Children: N/A  . Years of Education: N/A   Occupational History  . Not on file.   Social History Main Topics  . Smoking status: Never Smoker   . Smokeless tobacco: Never Used  . Alcohol Use: 0.0 oz/week    0 Standard drinks or equivalent per week     Comment: rare, social  . Drug Use: No  . Sexual Activity: Not on file   Other Topics Concern  . Not on file   Social History Narrative    Outpatient Prescriptions Prior to Visit  Medication Sig Dispense Refill  . aspirin 81 MG tablet Take 81 mg by mouth daily.    Marland Kitchen glucose blood (ACCU-CHEK AVIVA  PLUS) test strip 1 each by Other route 2 (two) times daily. Use as instructed 100 each 0  . hydrochlorothiazide (HYDRODIURIL) 25 MG tablet Take 1 tablet (25 mg total) by mouth daily. 90 tablet 3  . lisinopril (PRINIVIL,ZESTRIL) 20 MG tablet Take 1 tablet (20 mg total) by mouth daily. 90 tablet 3  . OVER THE COUNTER MEDICATION Vita Fusion Fiber- 2 daily    . Probiotic Product (PROBIOTIC DAILY PO) Take 1 capsule by mouth daily.    . rosuvastatin (CRESTOR) 10 MG tablet Take 1 tablet (10 mg total) by mouth daily. 90 tablet 3  . OVER THE COUNTER MEDICATION Stool Softner 2 daily    . B Complex Vitamins (VITAMIN-B COMPLEX PO) Take 1 tablet by mouth daily. Reported on 12/22/2015     No facility-administered medications prior to visit.    Allergies  Allergen Reactions  . Codeine Nausea And Vomiting    Review of Systems  Constitutional: Negative for fever,  chills and malaise/fatigue.  HENT: Negative for congestion and hearing loss.   Eyes: Negative for blurred vision and discharge.  Respiratory: Negative for cough, sputum production and shortness of breath.   Cardiovascular: Negative for chest pain, palpitations and leg swelling.  Gastrointestinal: Negative for heartburn, nausea, vomiting, abdominal pain, diarrhea, constipation and blood in stool.  Genitourinary: Negative for dysuria, urgency, frequency and hematuria.  Musculoskeletal: Negative for myalgias, back pain and falls.  Skin: Positive for itching and rash.  Neurological: Negative for dizziness, sensory change, loss of consciousness, weakness and headaches.  Endo/Heme/Allergies: Negative for environmental allergies. Does not bruise/bleed easily.  Psychiatric/Behavioral: Negative for depression and suicidal ideas. The patient is not nervous/anxious and does not have insomnia.        Objective:    Physical Exam  Constitutional: She is oriented to person, place, and time. She appears well-developed and well-nourished. No distress.  HENT:  Head: Normocephalic and atraumatic.  Eyes: Conjunctivae are normal.  Neck: Neck supple. No thyromegaly present.  Cardiovascular: Normal rate and regular rhythm.   Murmur heard. Pulmonary/Chest: Effort normal and breath sounds normal. No respiratory distress.  Abdominal: Soft. Bowel sounds are normal. She exhibits no distension and no mass. There is no tenderness.  Musculoskeletal: She exhibits no edema.  Lymphadenopathy:    She has no cervical adenopathy.  Neurological: She is alert and oriented to person, place, and time.  Skin: Skin is warm and dry.  Psychiatric: She has a normal mood and affect. Her behavior is normal.    BP 112/72 mmHg  Pulse 67  Temp(Src) 98.1 F (36.7 C) (Oral)  Ht 5\' 2"  (1.575 m)  Wt 152 lb 2 oz (69.003 kg)  BMI 27.82 kg/m2  SpO2 99% Wt Readings from Last 3 Encounters:  12/22/15 152 lb 2 oz (69.003 kg)    05/31/15 154 lb 6 oz (70.024 kg)  11/23/14 156 lb 2 oz (70.818 kg)     Lab Results  Component Value Date   WBC 6.7 12/16/2015   HGB 14.5 12/16/2015   HCT 43.2 12/16/2015   PLT 229.0 12/16/2015   GLUCOSE 134* 12/16/2015   CHOL 152 12/16/2015   TRIG 153.0* 12/16/2015   HDL 43.10 12/16/2015   LDLDIRECT 182.4 09/11/2011   LDLCALC 78 12/16/2015   ALT 18 12/16/2015   AST 24 12/16/2015   NA 135 12/16/2015   K 4.5 12/16/2015   CL 97 12/16/2015   CREATININE 0.78 12/16/2015   BUN 17 12/16/2015   CO2 30 12/16/2015   TSH 2.49 12/16/2015  INR 0.9 09/11/2011   HGBA1C 7.1* 12/16/2015    Lab Results  Component Value Date   TSH 2.49 12/16/2015   Lab Results  Component Value Date   WBC 6.7 12/16/2015   HGB 14.5 12/16/2015   HCT 43.2 12/16/2015   MCV 85.9 12/16/2015   PLT 229.0 12/16/2015   Lab Results  Component Value Date   NA 135 12/16/2015   K 4.5 12/16/2015   CO2 30 12/16/2015   GLUCOSE 134* 12/16/2015   BUN 17 12/16/2015   CREATININE 0.78 12/16/2015   BILITOT 0.6 12/16/2015   ALKPHOS 66 12/16/2015   AST 24 12/16/2015   ALT 18 12/16/2015   PROT 7.6 12/16/2015   ALBUMIN 4.4 12/16/2015   CALCIUM 9.9 12/16/2015   GFR 77.46 12/16/2015   Lab Results  Component Value Date   CHOL 152 12/16/2015   Lab Results  Component Value Date   HDL 43.10 12/16/2015   Lab Results  Component Value Date   LDLCALC 78 12/16/2015   Lab Results  Component Value Date   TRIG 153.0* 12/16/2015   Lab Results  Component Value Date   CHOLHDL 4 12/16/2015   Lab Results  Component Value Date   HGBA1C 7.1* 12/16/2015       Assessment & Plan:   Problem List Items Addressed This Visit    Preventative health care   Relevant Orders   CBC   TSH   Lipid panel   Comprehensive metabolic panel   Hemoglobin A1c   Uric acid   Hepatitis C Antibody   Overweight    Continues to follow weight watchers and is keeping the weight stable      Multiple thyroid nodules   Relevant  Orders   CBC   TSH   Lipid panel   Comprehensive metabolic panel   Hemoglobin A1c   Uric acid   Hepatitis C Antibody   Hyperlipidemia    Tolerating statin, encouraged heart healthy diet, avoid trans fats, minimize simple carbs and saturated fats. Increase exercise as tolerated      Relevant Orders   CBC   TSH   Lipid panel   Comprehensive metabolic panel   Hemoglobin A1c   Uric acid   Hepatitis C Antibody   GOUT, UNSPECIFIED    Recent flare in left great toe feels better now. Uric acid 6.7, she is encouraged to increase hydration and avoid offending foods.       Relevant Orders   CBC   TSH   Lipid panel   Comprehensive metabolic panel   Hemoglobin A1c   Uric acid   Hepatitis C Antibody   ESSENTIAL HYPERTENSION, BENIGN    Well controlled, no changes to meds. Encouraged heart healthy diet such as the DASH diet and exercise as tolerated.       Relevant Orders   CBC   TSH   Lipid panel   Comprehensive metabolic panel   Hemoglobin A1c   Uric acid   Hepatitis C Antibody   DM (diabetes mellitus), type 2 with neurological complications ([place]) - Primary    Feeling well. hgba1c acceptable, minimize simple carbs. Increase exercise as tolerated. Continue current meds. Has trended up some consider restarting Metformin if worsens.       Relevant Orders   CBC   TSH   Lipid panel   Comprehensive metabolic panel   Hemoglobin A1c   Uric acid   Hepatitis C Antibody    Other Visit Diagnoses    Need for hepatitis C  screening test        Relevant Orders    CBC    TSH    Lipid panel    Comprehensive metabolic panel    Hemoglobin A1c    Uric acid    Hepatitis C Antibody       I am having Ms. Hasegawa maintain her B Complex Vitamins (VITAMIN-B COMPLEX PO), aspirin, OVER THE COUNTER MEDICATION, Probiotic Product (PROBIOTIC DAILY PO), glucose blood, hydrochlorothiazide, lisinopril, rosuvastatin, and latanoprost.  Meds ordered this encounter  Medications  . latanoprost  (XALATAN) 0.005 % ophthalmic solution    Sig: Place 1 drop into both eyes daily.    Refill:  3     Penni Homans, MD

## 2015-12-22 NOTE — Patient Instructions (Addendum)
Scrub the area twice daily with hydrogen peroxide and apply some neosporin ointment.    Basic Carbohydrate Counting for Diabetes Mellitus Carbohydrate counting is a method for keeping track of the amount of carbohydrates you eat. Eating carbohydrates naturally increases the level of sugar (glucose) in your blood, so it is important for you to know the amount that is okay for you to have in every meal. Carbohydrate counting helps keep the level of glucose in your blood within normal limits. The amount of carbohydrates allowed is different for every person. A dietitian can help you calculate the amount that is right for you. Once you know the amount of carbohydrates you can have, you can count the carbohydrates in the foods you want to eat. Carbohydrates are found in the following foods:  Grains, such as breads and cereals.  Dried beans and soy products.  Starchy vegetables, such as potatoes, peas, and corn.  Fruit and fruit juices.  Milk and yogurt.  Sweets and snack foods, such as cake, cookies, candy, chips, soft drinks, and fruit drinks. CARBOHYDRATE COUNTING There are two ways to count the carbohydrates in your food. You can use either of the methods or a combination of both. Reading the "Nutrition Facts" on [place] The "Nutrition Facts" is an area that is included on the labels of almost all packaged food and beverages in the [place]. It includes the serving size of that food or beverage and information about the nutrients in each serving of the food, including the grams (g) of carbohydrate per serving.  Decide the number of servings of this food or beverage that you will be able to eat or drink. Multiply that number of servings by the number of grams of carbohydrate that is listed on the label for that serving. The total will be the amount of carbohydrates you will be having when you eat or drink this food or beverage. Learning Standard Serving Sizes of Food When you eat food  that is not packaged or does not include "Nutrition Facts" on the label, you need to measure the servings in order to count the amount of carbohydrates.A serving of most carbohydrate-rich foods contains about 15 g of carbohydrates. The following list includes serving sizes of carbohydrate-rich foods that provide 15 g ofcarbohydrate per serving:   1 slice of bread (1 oz) or 1 six-inch tortilla.    of a hamburger bun or English muffin.  4-6 crackers.   cup unsweetened dry cereal.    cup hot cereal.   cup rice or pasta.    cup mashed potatoes or  of a large baked potato.  1 cup fresh fruit or one small piece of fruit.    cup canned or frozen fruit or fruit juice.  1 cup milk.   cup plain fat-free yogurt or yogurt sweetened with artificial sweeteners.   cup cooked dried beans or starchy vegetable, such as peas, corn, or potatoes.  Decide the number of standard-size servings that you will eat. Multiply that number of servings by 15 (the grams of carbohydrates in that serving). For example, if you eat 2 cups of strawberries, you will have eaten 2 servings and 30 g of carbohydrates (2 servings x 15 g = 30 g). For foods such as soups and casseroles, in which more than one food is mixed in, you will need to count the carbohydrates in each food that is included. EXAMPLE OF CARBOHYDRATE COUNTING Sample Dinner  3 oz chicken breast.   cup of brown  rice.   cup of corn.  1 cup milk.   1 cup strawberries with sugar-free whipped topping.  Carbohydrate Calculation Step 1: Identify the foods that contain carbohydrates:   Rice.   Corn.   Milk.   Strawberries. Step 2:Calculate the number of servings eaten of each:   2 servings of rice.   1 serving of corn.   1 serving of milk.   1 serving of strawberries. Step 3: Multiply each of those number of servings by 15 g:   2 servings of rice x 15 g = 30 g.   1 serving of corn x 15 g = 15 g.   1 serving  of milk x 15 g = 15 g.   1 serving of strawberries x 15 g = 15 g. Step 4: Add together all of the amounts to find the total grams of carbohydrates eaten: 30 g + 15 g + 15 g + 15 g = 75 g.   This information is not intended to replace advice given to you by your health care provider. Make sure you discuss any questions you have with your health care provider.   Document Released: 07/23/2005 Document Revised: 08/13/2014 Document Reviewed: 06/19/2013 Elsevier Interactive Patient Education [employer].

## 2015-12-22 NOTE — Assessment & Plan Note (Signed)
Feeling well. hgba1c acceptable, minimize simple carbs. Increase exercise as tolerated. Continue current meds. Has trended up some consider restarting Metformin if worsens.

## 2015-12-22 NOTE — Assessment & Plan Note (Signed)
Continues to follow weight watchers and is keeping the weight stable

## 2015-12-22 NOTE — Assessment & Plan Note (Signed)
Tolerating statin, encouraged heart healthy diet, avoid trans fats, minimize simple carbs and saturated fats. Increase exercise as tolerated 

## 2016-03-21 ENCOUNTER — Other Ambulatory Visit: Payer: Self-pay

## 2016-03-26 ENCOUNTER — Ambulatory Visit: Payer: Self-pay | Admitting: Family Medicine

## 2016-04-19 ENCOUNTER — Other Ambulatory Visit (INDEPENDENT_AMBULATORY_CARE_PROVIDER_SITE_OTHER): Payer: Medicare Other

## 2016-04-19 DIAGNOSIS — E1149 Type 2 diabetes mellitus with other diabetic neurological complication: Secondary | ICD-10-CM | POA: Diagnosis not present

## 2016-04-19 DIAGNOSIS — I10 Essential (primary) hypertension: Secondary | ICD-10-CM | POA: Diagnosis not present

## 2016-04-19 DIAGNOSIS — Z1159 Encounter for screening for other viral diseases: Secondary | ICD-10-CM

## 2016-04-19 DIAGNOSIS — E042 Nontoxic multinodular goiter: Secondary | ICD-10-CM | POA: Diagnosis not present

## 2016-04-19 DIAGNOSIS — E785 Hyperlipidemia, unspecified: Secondary | ICD-10-CM

## 2016-04-19 DIAGNOSIS — Z0000 Encounter for general adult medical examination without abnormal findings: Secondary | ICD-10-CM | POA: Diagnosis not present

## 2016-04-19 DIAGNOSIS — M109 Gout, unspecified: Secondary | ICD-10-CM

## 2016-04-19 LAB — COMPREHENSIVE METABOLIC PANEL
ALT: 17 U/L (ref 0–35)
AST: 24 U/L (ref 0–37)
Albumin: 4.2 g/dL (ref 3.5–5.2)
Alkaline Phosphatase: 63 U/L (ref 39–117)
BILIRUBIN TOTAL: 0.6 mg/dL (ref 0.2–1.2)
BUN: 13 mg/dL (ref 6–23)
CALCIUM: 9.4 mg/dL (ref 8.4–10.5)
CO2: 31 meq/L (ref 19–32)
CREATININE: 0.78 mg/dL (ref 0.40–1.20)
Chloride: 97 mEq/L (ref 96–112)
GFR: 77.38 mL/min (ref >60.00)
GLUCOSE: 120 mg/dL — AB (ref 70–99)
Potassium: 4.3 mEq/L (ref 3.5–5.1)
Sodium: 135 mEq/L (ref 135–145)
Total Protein: 7.3 g/dL (ref 6.0–8.3)

## 2016-04-19 LAB — LIPID PANEL
CHOL/HDL RATIO: 3
CHOLESTEROL: 147 mg/dL (ref 0–200)
HDL: 51.4 mg/dL (ref >39.00)
LDL Cholesterol: 67 mg/dL (ref 0–99)
NonHDL: 95.11
TRIGLYCERIDES: 143 mg/dL (ref 0.0–149.0)
VLDL: 28.6 mg/dL (ref 0.0–40.0)

## 2016-04-19 LAB — URIC ACID: URIC ACID, SERUM: 6.7 mg/dL (ref 2.4–7.0)

## 2016-04-19 LAB — TSH: TSH: 2.1 u[IU]/mL (ref 0.35–4.50)

## 2016-04-19 LAB — CBC
HCT: 42.5 % (ref 36.0–46.0)
Hemoglobin: 14.4 g/dL (ref 12.0–15.0)
MCHC: 33.7 g/dL (ref 30.0–36.0)
MCV: 86.4 fl (ref 78.0–100.0)
PLATELETS: 202 10*3/uL (ref 150.0–400.0)
RBC: 4.92 Mil/uL (ref 3.87–5.11)
RDW: 14 % (ref 11.5–15.5)
WBC: 6.8 10*3/uL (ref 4.0–10.5)

## 2016-04-19 LAB — HEPATITIS C ANTIBODY: HCV Ab: NEGATIVE

## 2016-04-19 LAB — HEMOGLOBIN A1C: Hgb A1c MFr Bld: 6.8 % — ABNORMAL HIGH (ref 4.6–6.5)

## 2016-04-26 ENCOUNTER — Ambulatory Visit (INDEPENDENT_AMBULATORY_CARE_PROVIDER_SITE_OTHER): Payer: Medicare Other | Admitting: Family Medicine

## 2016-04-26 VITALS — BP 118/60 | HR 63 | Temp 97.9°F | Wt 150.0 lb

## 2016-04-26 DIAGNOSIS — E1149 Type 2 diabetes mellitus with other diabetic neurological complication: Secondary | ICD-10-CM | POA: Diagnosis not present

## 2016-04-26 DIAGNOSIS — I10 Essential (primary) hypertension: Secondary | ICD-10-CM

## 2016-04-26 DIAGNOSIS — M109 Gout, unspecified: Secondary | ICD-10-CM | POA: Diagnosis not present

## 2016-04-26 DIAGNOSIS — E042 Nontoxic multinodular goiter: Secondary | ICD-10-CM

## 2016-04-26 DIAGNOSIS — E785 Hyperlipidemia, unspecified: Secondary | ICD-10-CM

## 2016-04-26 NOTE — Progress Notes (Signed)
Pre visit review using our clinic review tool, if applicable. No additional management support is needed unless otherwise documented below in the visit note. 

## 2016-04-26 NOTE — Patient Instructions (Signed)

## 2016-05-05 NOTE — Assessment & Plan Note (Addendum)
hgba1c acceptable, minimize simple carbs. Increase exercise as tolerated.  

## 2016-05-05 NOTE — Assessment & Plan Note (Signed)
Well controlled, no changes to meds. Encouraged heart healthy diet such as the DASH diet and exercise as tolerated.  °

## 2016-05-05 NOTE — Assessment & Plan Note (Signed)
Tolerating statin, encouraged heart healthy diet, avoid trans fats, minimize simple carbs and saturated fats. Increase exercise as tolerated 

## 2016-05-05 NOTE — Assessment & Plan Note (Signed)
Asymptomatic, stay well hydrated.

## 2016-05-05 NOTE — Progress Notes (Signed)
Patient ID: Andrea Calderon, female   DOB: 29-Aug-1945, 70 y.o.   MRN: [id number]   Subjective:    Patient ID: Andrea Calderon, female    DOB: Jan 11, 1946, 70 y.o.   MRN: [id number]  Chief Complaint  Patient presents with  . Follow-up    HPI Patient is in today for follow up. No recent illness or recent hospitalization. Denies any polyuria or polydipsia. Denies CP/palp/SOB/HA/congestion/fevers/GI or GU c/o. Taking meds as prescribed  Past Medical History:  Diagnosis Date  . BCC (basal cell carcinoma), face 12/05/2014   [facility]oved from right side of [facility] Altamese Cabal and Dr Ferdinand Lango  . Cancer (HCC)    BCC right nose  . Diabetes mellitus   . H/O: stroke 09/11/2011   September 2012 acute right retinal artery occlusion H/o left retinal artery embolic event by imaging H/o blood clot in port during chemotherapy   . Multiple thyroid nodules 06/13/2012  . 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 Other and unspecified hyperlipidemia 12/16/2012  . Preventative health care 06/21/2013  . Valvular heart disease 06/13/2012   Cardiologist Dr Darral Dash of [facility] in [facility]    Past Surgical History:  Procedure Laterality Date  . ABDOMINAL HYSTERECTOMY  2009   total  . COLECTOMY  2009  . MOHS SURGERY Right    for [facility] on right nose, Feb 2016  . TONGUE BIOPSY  01/05/15   negative results  . VENTRAL HERNIA REPAIR  11/11   4 lesions found, mesh left in place    Family History  Problem Relation Age of Onset  . Transient ischemic attack Mother   . Hypertension Mother   . Dementia Mother   . Diabetes Father     borderline sugar  . Coronary artery disease Father   . Heart disease Maternal Grandfather   . Heart disease Paternal Grandfather     Social History   Social History  . Marital status: Married    Spouse name: N/A  . Number of children: N/A  . Years of education: N/A   Occupational History  . Not on file.   Social History Main Topics  . Smoking  status: Never Smoker  . Smokeless tobacco: Never Used  . Alcohol use 0.0 oz/week     Comment: rare, social  . Drug use: No  . Sexual activity: Not on file   Other Topics Concern  . Not on file   Social History Narrative  . No narrative on file    Outpatient Medications Prior to Visit  Medication Sig Dispense Refill  . aspirin 81 MG tablet Take 81 mg by mouth daily.    . B Complex Vitamins (VITAMIN-B COMPLEX PO) Take 1 tablet by mouth daily. Reported on 12/22/2015    . glucose blood (ACCU-CHEK AVIVA PLUS) test strip 1 each by Other route 2 (two) times daily. Use as instructed 100 each 0  . hydrochlorothiazide (HYDRODIURIL) 25 MG tablet Take 1 tablet (25 mg total) by mouth daily. 90 tablet 3  . latanoprost (XALATAN) 0.005 % ophthalmic solution Place 1 drop into both eyes daily.  3  . lisinopril (PRINIVIL,ZESTRIL) 20 MG tablet Take 1 tablet (20 mg total) by mouth daily. 90 tablet 3  . OVER THE COUNTER MEDICATION Vita Fusion Fiber- 2 daily    . Probiotic Product (PROBIOTIC DAILY PO) Take 1 capsule by mouth daily.    . rosuvastatin (CRESTOR) 10 MG tablet Take 1 tablet (10 mg total) by  mouth daily. 90 tablet 3   No facility-administered medications prior to visit.     Allergies  Allergen Reactions  . Codeine Nausea And Vomiting    Review of Systems  Constitutional: Negative for fever and malaise/fatigue.  HENT: Negative for congestion.   Eyes: Negative for blurred vision.  Respiratory: Negative for shortness of breath.   Cardiovascular: Negative for chest pain, palpitations and leg swelling.  Gastrointestinal: Negative for abdominal pain, blood in stool and nausea.  Genitourinary: Negative for dysuria and frequency.  Musculoskeletal: Negative for falls.  Skin: Negative for rash.  Neurological: Negative for dizziness, loss of consciousness and headaches.  Endo/Heme/Allergies: Negative for environmental allergies.  Psychiatric/Behavioral: Negative for depression. The patient is  not nervous/anxious.        Objective:    Physical Exam  Constitutional: She is oriented to person, place, and time. She appears well-developed and well-nourished. No distress.  HENT:  Head: Normocephalic and atraumatic.  Right Ear: External ear normal.  Left Ear: External ear normal.  Nose: Nose normal.  Mouth/Throat: Oropharynx is clear and moist.  Eyes: Conjunctivae and EOM are normal. Pupils are equal, round, and reactive to light. Right eye exhibits no discharge. Left eye exhibits no discharge.  Neck: Normal range of motion. Neck supple. No JVD present. No thyromegaly present.  Cardiovascular: Normal rate, regular rhythm, normal heart sounds and intact distal pulses.   No murmur heard. Pulmonary/Chest: Effort normal and breath sounds normal. No respiratory distress. She has no wheezes. She has no rales. She exhibits no tenderness.  Abdominal: Soft. Bowel sounds are normal. She exhibits no distension and no mass. There is no tenderness. There is no rebound and no guarding.  Musculoskeletal: Normal range of motion. She exhibits no edema or tenderness.  Lymphadenopathy:    She has no cervical adenopathy.  Neurological: She is alert and oriented to person, place, and time. She has normal reflexes. No cranial nerve deficit.  Skin: Skin is warm and dry. No rash noted. She is not diaphoretic. No erythema.  Psychiatric: She has a normal mood and affect. Her behavior is normal. Judgment and thought content normal.  Nursing note and vitals reviewed.   BP 118/60 (BP Location: Left Arm, Patient Position: Sitting, Cuff Size: Normal)   Pulse 63   Temp 97.9 F (36.6 C) (Oral)   Wt 150 lb (68 kg)   SpO2 97%   BMI 27.44 kg/m  Wt Readings from Last 3 Encounters:  04/26/16 150 lb (68 kg)  12/22/15 152 lb 2 oz (69 kg)  05/31/15 154 lb 6 oz (70 kg)     Lab Results  Component Value Date   WBC 6.8 04/19/2016   HGB 14.4 04/19/2016   HCT 42.5 04/19/2016   PLT 202.0 04/19/2016   GLUCOSE  120 (H) 04/19/2016   CHOL 147 04/19/2016   TRIG 143.0 04/19/2016   HDL 51.40 04/19/2016   LDLDIRECT 182.4 09/11/2011   LDLCALC 67 04/19/2016   ALT 17 04/19/2016   AST 24 04/19/2016   NA 135 04/19/2016   K 4.3 04/19/2016   CL 97 04/19/2016   CREATININE 0.78 04/19/2016   BUN 13 04/19/2016   CO2 31 04/19/2016   TSH 2.10 04/19/2016   INR 0.9 09/11/2011   HGBA1C 6.8 (H) 04/19/2016    Lab Results  Component Value Date   TSH 2.10 04/19/2016   Lab Results  Component Value Date   WBC 6.8 04/19/2016   HGB 14.4 04/19/2016   HCT 42.5 04/19/2016   MCV  86.4 04/19/2016   PLT 202.0 04/19/2016   Lab Results  Component Value Date   NA 135 04/19/2016   K 4.3 04/19/2016   CO2 31 04/19/2016   GLUCOSE 120 (H) 04/19/2016   BUN 13 04/19/2016   CREATININE 0.78 04/19/2016   BILITOT 0.6 04/19/2016   ALKPHOS 63 04/19/2016   AST 24 04/19/2016   ALT 17 04/19/2016   PROT 7.3 04/19/2016   ALBUMIN 4.2 04/19/2016   CALCIUM 9.4 04/19/2016   GFR 77.38 04/19/2016   Lab Results  Component Value Date   CHOL 147 04/19/2016   Lab Results  Component Value Date   HDL 51.40 04/19/2016   Lab Results  Component Value Date   LDLCALC 67 04/19/2016   Lab Results  Component Value Date   TRIG 143.0 04/19/2016   Lab Results  Component Value Date   CHOLHDL 3 04/19/2016   Lab Results  Component Value Date   HGBA1C 6.8 (H) 04/19/2016       Assessment & Plan:   Problem List Items Addressed This Visit    DM (diabetes mellitus), type 2 with neurological complications ([place]) - Primary    hgba1c acceptable, minimize simple carbs. Increase exercise as tolerated.       Relevant Orders   Hemoglobin A1c   GOUT, UNSPECIFIED    Asymptomatic, stay well hydrated.       Relevant Orders   Uric acid   ESSENTIAL HYPERTENSION, BENIGN    Well controlled, no changes to meds. Encouraged heart healthy diet such as the DASH diet and exercise as tolerated.       Relevant Orders   CBC   TSH    Comprehensive metabolic panel   Multiple thyroid nodules   Hyperlipidemia    Tolerating statin, encouraged heart healthy diet, avoid trans fats, minimize simple carbs and saturated fats. Increase exercise as tolerated      Relevant Orders   Lipid panel    Other Visit Diagnoses   None.     I am having Ms. Pauling maintain her B Complex Vitamins (VITAMIN-B COMPLEX PO), aspirin, OVER THE COUNTER MEDICATION, Probiotic Product (PROBIOTIC DAILY PO), glucose blood, hydrochlorothiazide, lisinopril, rosuvastatin, and latanoprost.  No orders of the defined types were placed in this encounter.    Penni Homans, MD

## 2016-05-23 ENCOUNTER — Ambulatory Visit (INDEPENDENT_AMBULATORY_CARE_PROVIDER_SITE_OTHER): Payer: Medicare Other | Admitting: Physician Assistant

## 2016-05-23 ENCOUNTER — Encounter: Payer: Self-pay | Admitting: Physician Assistant

## 2016-05-23 VITALS — BP 100/62 | HR 68 | Temp 98.1°F | Resp 16 | Ht 62.0 in | Wt 156.0 lb

## 2016-05-23 DIAGNOSIS — R220 Localized swelling, mass and lump, head: Secondary | ICD-10-CM

## 2016-05-23 NOTE — Patient Instructions (Signed)
The area of concern seems consistent with a small cyst, possibly a developing lipoma but hard to tell.  The mass is mobile and soft. Does not seem worrisome for anything cancerous.   I want you to schedule an appointment with Dermatology for further assessment and removal.

## 2016-05-24 NOTE — Progress Notes (Signed)
Patient presents to clinic today c/o "knot" of forehead x 3 weeks, located superior to left orbit. Denies known trauma or injury to the area. Endorses knot felt firm at onset. Patient applied warm compresses to the area and felt the know soften. Denies any drainage, tenderness, pruritus. Denies erythema of skin or other skin changes. Denies any similar lesions noted elsewhere.   Past Medical History:  Diagnosis Date  . BCC (basal cell carcinoma), face 12/05/2014   Removed from right side of nose [facility] Dr Altamese Cabal and Dr Ferdinand Lango  . Cancer (HCC)    BCC right nose  . Diabetes mellitus   . H/O: stroke 09/11/2011   September 2012 acute right retinal artery occlusion H/o left retinal artery embolic event by imaging H/o blood clot in port during chemotherapy   . Multiple thyroid nodules 06/13/2012  . 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 Other and unspecified hyperlipidemia 12/16/2012  . Preventative health care 06/21/2013  . Valvular heart disease 06/13/2012   Cardiologist Dr Darral Dash of [facility] in [facility]    Current Outpatient Prescriptions on File Prior to Visit  Medication Sig Dispense Refill  . aspirin 81 MG tablet Take 81 mg by mouth daily.    . B Complex Vitamins (VITAMIN-B COMPLEX PO) Take 1 tablet by mouth daily. Reported on 12/22/2015    . glucose blood (ACCU-CHEK AVIVA PLUS) test strip 1 each by Other route 2 (two) times daily. Use as instructed 100 each 0  . hydrochlorothiazide (HYDRODIURIL) 25 MG tablet Take 1 tablet (25 mg total) by mouth daily. 90 tablet 3  . latanoprost (XALATAN) 0.005 % ophthalmic solution Place 1 drop into both eyes daily.  3  . lisinopril (PRINIVIL,ZESTRIL) 20 MG tablet Take 1 tablet (20 mg total) by mouth daily. 90 tablet 3  . OVER THE COUNTER MEDICATION Vita Fusion Fiber- 2 daily    . Probiotic Product (PROBIOTIC DAILY PO) Take 1 capsule by mouth daily.    . rosuvastatin (CRESTOR) 10 MG tablet Take 1 tablet (10 mg  total) by mouth daily. 90 tablet 3   No current facility-administered medications on file prior to visit.     Allergies  Allergen Reactions  . Codeine Nausea And Vomiting    Family History  Problem Relation Age of Onset  . Transient ischemic attack Mother   . Hypertension Mother   . Dementia Mother   . Diabetes Father     borderline sugar  . Coronary artery disease Father   . Heart disease Maternal Grandfather   . Heart disease Paternal Grandfather     Social History   Social History  . Marital status: Married    Spouse name: N/A  . Number of children: N/A  . Years of education: N/A   Social History Main Topics  . Smoking status: Never Smoker  . Smokeless tobacco: Never Used  . Alcohol use 0.0 oz/week     Comment: rare, social  . Drug use: No  . Sexual activity: Not Asked   Other Topics Concern  . None   Social History Narrative  . None    Review of Systems - See HPI.  All other ROS are negative.  BP 100/62 (BP Location: Right Arm, Patient Position: Sitting, Cuff Size: Normal)   Pulse 68   Temp 98.1 F (36.7 C) (Oral)   Resp 16   Ht 5\' 2"  (1.575 m)   Wt 156 lb (70.8 kg)   SpO2 98%  BMI 28.53 kg/m   Physical Exam  Constitutional: She is oriented to person, place, and time and well-developed, well-nourished, and in no distress.  HENT:  Head: Normocephalic and atraumatic.  Eyes: Conjunctivae are normal.  Cardiovascular: Normal rate, regular rhythm, normal heart sounds and intact distal pulses.   Pulmonary/Chest: Effort normal.  Neurological: She is alert and oriented to person, place, and time.  Skin: Skin is warm and dry.     Psychiatric: Affect normal.  Vitals reviewed.   Recent Results (from the past 2160 hour(s))  CBC     Status: None   Collection Time: 04/19/16  8:23 AM  Result Value Ref Range   WBC 6.8 4.0 - 10.5 K/uL   RBC 4.92 3.87 - 5.11 Mil/uL   Platelets 202.0 150.0 - 400.0 K/uL   Hemoglobin 14.4 12.0 - 15.0 g/dL   HCT 42.5  36.0 - 46.0 %   MCV 86.4 78.0 - 100.0 fl   MCHC 33.7 30.0 - 36.0 g/dL   RDW 14.0 11.5 - 15.5 %  TSH     Status: None   Collection Time: 04/19/16  8:23 AM  Result Value Ref Range   TSH 2.10 0.35 - 4.50 uIU/mL  Lipid panel     Status: None   Collection Time: 04/19/16  8:23 AM  Result Value Ref Range   Cholesterol 147 0 - 200 mg/dL    Comment: ATP III Classification       Desirable:  < 200 mg/dL               Borderline High:  200 - 239 mg/dL          High:  > = 240 mg/dL   Triglycerides 143.0 0.0 - 149.0 mg/dL    Comment: Normal:  <150 mg/dLBorderline High:  150 - 199 mg/dL   HDL 51.40 >39.00 mg/dL   VLDL 28.6 0.0 - 40.0 mg/dL   LDL Cholesterol 67 0 - 99 mg/dL   Total CHOL/HDL Ratio 3     Comment:                Men          Women1/2 Average Risk     3.4          3.3Average Risk          5.0          4.42X Average Risk          9.6          7.13X Average Risk          15.0          11.0                       NonHDL 95.11     Comment: NOTE:  Non-HDL goal should be 30 mg/dL higher than patient's LDL goal (i.e. LDL goal of < 70 mg/dL, would have non-HDL goal of < 100 mg/dL)  Comprehensive metabolic panel     Status: Abnormal   Collection Time: 04/19/16  8:23 AM  Result Value Ref Range   Sodium 135 135 - 145 mEq/L   Potassium 4.3 3.5 - 5.1 mEq/L   Chloride 97 96 - 112 mEq/L   CO2 31 19 - 32 mEq/L   Glucose, Bld 120 (H) 70 - 99 mg/dL   BUN 13 6 - 23 mg/dL   Creatinine, Ser 0.78 0.40 - 1.20 mg/dL   Total Bilirubin 0.6 0.2 -  1.2 mg/dL   Alkaline Phosphatase 63 39 - 117 U/L   AST 24 0 - 37 U/L   ALT 17 0 - 35 U/L   Total Protein 7.3 6.0 - 8.3 g/dL   Albumin 4.2 3.5 - 5.2 g/dL   Calcium 9.4 8.4 - 10.5 mg/dL   GFR 77.38 >60.00 mL/min  Hemoglobin A1c     Status: Abnormal   Collection Time: 04/19/16  8:23 AM  Result Value Ref Range   Hgb A1c MFr Bld 6.8 (H) 4.6 - 6.5 %    Comment: Glycemic Control Guidelines for People with Diabetes:Non Diabetic:  <6%Goal of Therapy: <7%Additional  Action Suggested:  >8%   Uric acid     Status: None   Collection Time: 04/19/16  8:23 AM  Result Value Ref Range   Uric Acid, Serum 6.7 2.4 - 7.0 mg/dL  Hepatitis C Antibody     Status: None   Collection Time: 04/19/16  8:23 AM  Result Value Ref Range   HCV Ab NEGATIVE NEGATIVE    Assessment/Plan: 1. Subcutaneous mass of head Cyst versus potential lipoma. Discussed monitoring versus removal. Patient would like to discuss with her Dermatologist, Dr. Altamese Cabal. She is calling to schedule an appointment.    Leeanne Rio, PA-C

## 2016-05-29 ENCOUNTER — Ambulatory Visit (INDEPENDENT_AMBULATORY_CARE_PROVIDER_SITE_OTHER): Payer: Medicare Other

## 2016-05-29 DIAGNOSIS — Z23 Encounter for immunization: Secondary | ICD-10-CM | POA: Diagnosis not present

## 2016-06-18 LAB — CBC AND DIFFERENTIAL
HCT: 42 % (ref 36–46)
HEMOGLOBIN: 14.5 g/dL (ref 12.0–16.0)
Neutrophils Absolute: 4 /uL
Platelets: 188 10*3/uL (ref 150–399)
WBC: 6.4 10^3/mL

## 2016-06-18 LAB — HEPATIC FUNCTION PANEL
ALK PHOS: 59 U/L (ref 25–125)
ALT: 18 U/L (ref 7–35)
AST: 26 U/L (ref 13–35)
Bilirubin, Total: 0.7 mg/dL

## 2016-06-18 LAB — BASIC METABOLIC PANEL
BUN: 16 mg/dL (ref 4–21)
CREATININE: 0.7 mg/dL (ref 0.5–1.1)
Glucose: 101 mg/dL
POTASSIUM: 4.9 mmol/L (ref 3.4–5.3)
Sodium: 135 mmol/L — AB (ref 137–147)

## 2016-06-25 ENCOUNTER — Other Ambulatory Visit: Payer: Self-pay | Admitting: Family Medicine

## 2016-06-25 MED ORDER — HYDROCHLOROTHIAZIDE 25 MG PO TABS: 25.0000 mg | ORAL_TABLET | Freq: Every day | ORAL | 3 refills | Status: DC

## 2016-06-26 ENCOUNTER — Other Ambulatory Visit: Payer: Self-pay | Admitting: Family Medicine

## 2016-06-26 MED ORDER — ROSUVASTATIN CALCIUM 10 MG PO TABS: 10.0000 mg | ORAL_TABLET | Freq: Every day | ORAL | 3 refills | Status: DC

## 2016-07-09 ENCOUNTER — Other Ambulatory Visit: Payer: Self-pay | Admitting: Family Medicine

## 2016-07-09 MED ORDER — LISINOPRIL 20 MG PO TABS: 20.0000 mg | ORAL_TABLET | Freq: Every day | ORAL | 1 refills | Status: DC

## 2016-07-10 NOTE — Progress Notes (Signed)
[facility]- Hematology and [place]

## 2016-07-18 LAB — HM DIABETES EYE EXAM

## 2016-07-23 ENCOUNTER — Encounter: Payer: Self-pay | Admitting: Family Medicine

## 2016-07-23 NOTE — Progress Notes (Signed)
[facility] on 07/18/16  TL/CMA

## 2016-09-24 ENCOUNTER — Telehealth: Payer: Self-pay | Admitting: Family Medicine

## 2016-09-24 NOTE — Telephone Encounter (Signed)
Pt dropped off paperwork for Dr. Charlett Blake to complete, documents left in tray at front office

## 2016-10-02 NOTE — Telephone Encounter (Signed)
Received [facility] documents/PCP has reviewed/patient has appt with nurse on 10/09/16 for TB skin test/once read later in the week patient can have competed documents/or can mail to [facility].

## 2016-10-09 ENCOUNTER — Ambulatory Visit (INDEPENDENT_AMBULATORY_CARE_PROVIDER_SITE_OTHER): Payer: Medicare Other

## 2016-10-09 DIAGNOSIS — Z111 Encounter for screening for respiratory tuberculosis: Secondary | ICD-10-CM

## 2016-10-09 NOTE — Progress Notes (Signed)
Pre visit review using our clinic tool,if applicable. No additional management support is needed unless otherwise documented below in the visit note.   Patient in for PPD administration. Given ID left forearm. Patient to retrun on 10/11/16 by 3:21pm for reading.

## 2016-10-11 LAB — TB SKIN TEST
INDURATION: 0 mm
TB SKIN TEST: NEGATIVE

## 2016-10-11 NOTE — Telephone Encounter (Signed)
Documents completed and mailed today to [facility]/10/11/2016

## 2016-11-15 ENCOUNTER — Other Ambulatory Visit (INDEPENDENT_AMBULATORY_CARE_PROVIDER_SITE_OTHER): Payer: Medicare Other

## 2016-11-15 DIAGNOSIS — E1149 Type 2 diabetes mellitus with other diabetic neurological complication: Secondary | ICD-10-CM

## 2016-11-15 DIAGNOSIS — M109 Gout, unspecified: Secondary | ICD-10-CM | POA: Diagnosis not present

## 2016-11-15 DIAGNOSIS — E785 Hyperlipidemia, unspecified: Secondary | ICD-10-CM | POA: Diagnosis not present

## 2016-11-15 DIAGNOSIS — I10 Essential (primary) hypertension: Secondary | ICD-10-CM | POA: Diagnosis not present

## 2016-11-15 LAB — COMPREHENSIVE METABOLIC PANEL
ALT: 18 U/L (ref 0–35)
AST: 22 U/L (ref 0–37)
Albumin: 4.2 g/dL (ref 3.5–5.2)
Alkaline Phosphatase: 62 U/L (ref 39–117)
BUN: 19 mg/dL (ref 6–23)
CHLORIDE: 98 meq/L (ref 96–112)
CO2: 30 meq/L (ref 19–32)
Calcium: 9.7 mg/dL (ref 8.4–10.5)
Creatinine, Ser: 0.82 mg/dL (ref 0.40–1.20)
GFR: 72.92 mL/min (ref >60.00)
Glucose, Bld: 135 mg/dL — ABNORMAL HIGH (ref 70–99)
Potassium: 4.4 mEq/L (ref 3.5–5.1)
Sodium: 134 mEq/L — ABNORMAL LOW (ref 135–145)
Total Bilirubin: 0.5 mg/dL (ref 0.2–1.2)
Total Protein: 7.4 g/dL (ref 6.0–8.3)

## 2016-11-15 LAB — LIPID PANEL
CHOL/HDL RATIO: 3
CHOLESTEROL: 153 mg/dL (ref 0–200)
HDL: 58 mg/dL (ref >39.00)
LDL CALC: 69 mg/dL (ref 0–99)
NonHDL: 94.85
Triglycerides: 131 mg/dL (ref 0.0–149.0)
VLDL: 26.2 mg/dL (ref 0.0–40.0)

## 2016-11-15 LAB — CBC
HCT: 42.7 % (ref 36.0–46.0)
HEMOGLOBIN: 14.2 g/dL (ref 12.0–15.0)
MCHC: 33.1 g/dL (ref 30.0–36.0)
MCV: 87.9 fl (ref 78.0–100.0)
PLATELETS: 207 10*3/uL (ref 150.0–400.0)
RBC: 4.86 Mil/uL (ref 3.87–5.11)
RDW: 13.9 % (ref 11.5–15.5)
WBC: 7.3 10*3/uL (ref 4.0–10.5)

## 2016-11-15 LAB — URIC ACID: Uric Acid, Serum: 7.2 mg/dL — ABNORMAL HIGH (ref 2.4–7.0)

## 2016-11-15 LAB — HEMOGLOBIN A1C: HEMOGLOBIN A1C: 7 % — AB (ref 4.6–6.5)

## 2016-11-15 LAB — TSH: TSH: 2.83 u[IU]/mL (ref 0.35–4.50)

## 2016-11-20 ENCOUNTER — Ambulatory Visit (INDEPENDENT_AMBULATORY_CARE_PROVIDER_SITE_OTHER): Payer: Medicare Other | Admitting: Family Medicine

## 2016-11-20 ENCOUNTER — Encounter: Payer: Self-pay | Admitting: Family Medicine

## 2016-11-20 VITALS — BP 102/62 | HR 65 | Temp 98.1°F | Resp 18 | Ht 62.0 in | Wt 154.2 lb

## 2016-11-20 DIAGNOSIS — E785 Hyperlipidemia, unspecified: Secondary | ICD-10-CM | POA: Diagnosis not present

## 2016-11-20 DIAGNOSIS — I10 Essential (primary) hypertension: Secondary | ICD-10-CM

## 2016-11-20 DIAGNOSIS — Z0000 Encounter for general adult medical examination without abnormal findings: Secondary | ICD-10-CM | POA: Diagnosis not present

## 2016-11-20 DIAGNOSIS — E1149 Type 2 diabetes mellitus with other diabetic neurological complication: Secondary | ICD-10-CM | POA: Diagnosis not present

## 2016-11-20 DIAGNOSIS — M109 Gout, unspecified: Secondary | ICD-10-CM | POA: Diagnosis not present

## 2016-11-20 NOTE — Assessment & Plan Note (Signed)
Colonoscopy scheduled for this year. Diagnosed in 2009. Oncology is Dr Maylon Peppers, [facility] does her colonoscopies, Carlyon Shadow

## 2016-11-20 NOTE — Assessment & Plan Note (Signed)
Patient denies any difficulties at home. No trouble with ADLs, depression or falls. See EMR for functional status screen and depression screen. No recent changes to vision or hearing. Is UTD with immunizations. Is UTD with screening. Discussed Advanced Directives. Encouraged heart healthy diet, exercise as tolerated and adequate sleep. See patient's problem list for health risk factors to monitor. See AVS for preventative healthcare recommendation schedule. 

## 2016-11-20 NOTE — Assessment & Plan Note (Signed)
Stay well hydrated Uric acid up slightly

## 2016-11-20 NOTE — Patient Instructions (Signed)
HYDRATE HYDRATE HYDRATE 64 OUNCES OF WATER A DAY!!!  Gout Gout is painful swelling that can occur in some of your joints. Gout is a type of arthritis. This condition is caused by having too much uric acid in your body. Uric acid is a chemical that forms when your body breaks down substances called purines. Purines are important for building body proteins. When your body has too much uric acid, sharp crystals can form and build up inside your joints. This causes pain and swelling. Gout attacks can happen quickly and be very painful (acute gout). Over time, the attacks can affect more joints and become more frequent (chronic gout). Gout can also cause uric acid to build up under your skin and inside your kidneys. What are the causes? This condition is caused by too much uric acid in your blood. This can occur because:  Your kidneys do not remove enough uric acid from your blood. This is the most common cause.  Your body makes too much uric acid. This can occur with some cancers and cancer treatments. It can also occur if your body is breaking down too many red blood cells (hemolytic anemia).  You eat too many foods that are high in purines. These foods include organ meats and some seafood. Alcohol, especially beer, is also high in purines. A gout attack may be triggered by trauma or stress. What increases the risk? This condition is more likely to develop in people who:  Have a family history of gout.  Are female and middle-aged.  Are female and have gone through menopause.  Are obese.  Frequently drink alcohol, especially beer.  Are dehydrated.  Lose weight too quickly.  Have an organ transplant.  Have lead poisoning.  Take certain medicines, including aspirin, cyclosporine, diuretics, levodopa, and niacin.  Have kidney disease or psoriasis. What are the signs or symptoms? An attack of acute gout happens quickly. It usually occurs in just one joint. The most common place is the  big toe. Attacks often start at night. Other joints that may be affected include joints of the feet, ankle, knee, fingers, wrist, or elbow. Symptoms may include:  Severe pain.  Warmth.  Swelling.  Stiffness.  Tenderness. The affected joint may be very painful to touch.  Shiny, red, or purple skin.  Chills and fever. Chronic gout may cause symptoms more frequently. More joints may be involved. You may also have white or yellow lumps (tophi) on your hands or feet or in other areas near your joints. How is this diagnosed? This condition is diagnosed based on your symptoms, medical history, and physical exam. You may have tests, such as:  Blood tests to measure uric acid levels.  Removal of joint fluid with a needle (aspiration) to look for uric acid crystals.  X-rays to look for joint damage. How is this treated? Treatment for this condition has two phases: treating an acute attack and preventing future attacks. Acute gout treatment may include medicines to reduce pain and swelling, including:  NSAIDs.  Steroids. These are strong anti-inflammatory medicines that can be taken by mouth (orally) or injected into a joint.  Colchicine. This medicine relieves pain and swelling when it is taken soon after an attack. It can be given orally or through an IV tube. Preventive treatment may include:  Daily use of smaller doses of NSAIDs or colchicine.  Use of a medicine that reduces uric acid levels in your blood.  Changes to your diet. You may need to see a  specialist about healthy eating (dietitian). Follow these instructions at home: During a Gout Attack   If directed, apply ice to the affected area:  Put ice in a plastic bag.  Place a towel between your skin and the bag.  Leave the ice on for 20 minutes, 2-3 times a day.  Rest the joint as much as possible. If the affected joint is in your leg, you may be given crutches to use.  Raise (elevate) the affected joint above the  level of your heart as often as possible.  Drink enough fluids to keep your urine clear or pale yellow.  Take over-the-counter and prescription medicines only as told by your health care provider.  Do not drive or operate heavy machinery while taking prescription pain medicine.  Follow instructions from your health care provider about eating or drinking restrictions.  Return to your normal activities as told by your health care provider. Ask your health care provider what activities are safe for you. Avoiding Future Gout Attacks   Follow a low-purine diet as told by your dietitian or health care provider. Avoid foods and drinks that are high in purines, including liver, kidney, anchovies, asparagus, herring, mushrooms, mussels, and beer.  Limit alcohol intake to no more than 1 drink a day for nonpregnant women and 2 drinks a day for men. One drink equals 12 oz of beer, 5 oz of wine, or 1 oz of hard liquor.  Maintain a healthy weight or lose weight if you are overweight. If you want to lose weight, talk with your health care provider. It is important that you do not lose weight too quickly.  Start or maintain an exercise program as told by your health care provider.  Drink enough fluids to keep your urine clear or pale yellow.  Take over-the-counter and prescription medicines only as told by your health care provider.  Keep all follow-up visits as told by your health care provider. This is important. Contact a health care provider if:  You have another gout attack.  You continue to have symptoms of a gout attack after10 days of treatment.  You have side effects from your medicines.  You have chills or a fever.  You have burning pain when you urinate.  You have pain in your lower back or belly. Get help right away if:  You have severe or uncontrolled pain.  You cannot urinate. This information is not intended to replace advice given to you by your health care provider. Make  sure you discuss any questions you have with your health care provider. Document Released: 07/20/2000 Document Revised: 12/29/2015 Document Reviewed: 05/05/2015 Elsevier Interactive Patient Education  2017 [employer].

## 2016-11-20 NOTE — Assessment & Plan Note (Signed)
Encouraged heart healthy diet, increase exercise, avoid trans fats, consider a krill oil cap daily 

## 2016-11-20 NOTE — Assessment & Plan Note (Signed)
Well controlled, no changes to meds. Encouraged heart healthy diet such as the DASH diet and exercise as tolerated.  °

## 2016-11-20 NOTE — Progress Notes (Signed)
Subjective:  I acted as a Education administrator for Dr. Charlett Blake. [place], [place]   Patient ID: Andrea Calderon, female    DOB: 1946-01-27, 71 y.o.   MRN: [id number]  Chief Complaint  Patient presents with  . Medicare Well Visit  . Follow-up  . Hypertension  . Diabetes    Hypertension  This is a recurrent problem. The problem is controlled. Pertinent negatives include no blurred vision, headaches, malaise/fatigue or palpitations.  Diabetes  She presents for her follow-up diabetic visit. Hypoglycemia symptoms include nervousness/anxiousness. Pertinent negatives for hypoglycemia include no headaches. Pertinent negatives for diabetes include no blurred vision.    Patient is in today for a medicare well visit and follow up on hypertension, diabetes and other chronic conditions. Patient has no acute concerns. Patient was recently stressed over selling her house and had a rosacea flair up, Dr. Altamese Cabal is following. She has just sold her home and is getting ready for the tag sale then they are scheduled to move to [place]. She has been under a great deal of stress with her husband's poor health and getting ready to move. She feels she is handling this well. No polyuria or polydipsia despite eating more carbs with stress. Doing well with ADLs at home. No recent falls or recent febrile illness or hospitalizations. Denies CP/palp/SOB/HA/congestion/fevers/GI or GU c/o. Taking meds as prescribed Patient Care Team: [name], MD as PCP - General (Family Medicine) Maralyn Sago, MD as Referring Physician (Gynecologic Oncology) Langley Gauss, MD as Referring Physician (Gastroenterology) Kathlen Brunswick, MD as Referring Physician (Cardiology) Leonia Corona, MD as Referring Physician (Ophthalmology) Murvin Donning, MD as Consulting Physician (Dermatology)   Past Medical History:  Diagnosis Date  . BCC (basal cell carcinoma), face 12/05/2014   [facility]e Cabal and Dr Ferdinand Lango  . Cancer (HCC)    BCC right nose  . Diabetes mellitus   . H/O: stroke 09/11/2011   September 2012 acute right retinal artery occlusion H/o left retinal artery embolic event by imaging H/o blood clot in port during chemotherapy   . Multiple thyroid nodules 06/13/2012  . Oral lichen planus 09/07/9796  . Other and unspecified hyperlipidemia 12/16/2012  . Preventative health care 06/21/2013  . Rosacea   . Valvular heart disease 06/13/2012   Cardiologist Dr Darral Dash of [facility] in [facility]    Past Surgical History:  Procedure Laterality Date  . ABDOMINAL HYSTERECTOMY  2009   total  . COLECTOMY  2009  . MOHS SURGERY Right    for [facility] on right nose, Feb 2016  . TONGUE BIOPSY  01/05/15   negative results  . VENTRAL HERNIA REPAIR  11/11   4 lesions found, mesh left in place    Family History  Problem Relation Age of Onset  . Transient ischemic attack Mother   . Hypertension Mother   . Dementia Mother   . Diabetes Father     borderline sugar  . Coronary artery disease Father   . Heart disease Maternal Grandfather   . Heart disease Paternal Grandfather     Social History   Social History  . Marital status: Married    Spouse name: N/A  . Number of children: N/A  . Years of education: N/A   Occupational History  . Not on file.   Social History Main Topics  . Smoking status: Never Smoker  . Smokeless tobacco: Never Used  .  Alcohol use 0.0 oz/week     Comment: rare, social  . Drug use: No  . Sexual activity: Yes   Other Topics Concern  . Not on file   Social History Narrative  . No narrative on file    Outpatient Medications Prior to Visit  Medication Sig Dispense Refill  . aspirin 81 MG tablet Take 81 mg by mouth daily.    . B Complex Vitamins (VITAMIN-B COMPLEX PO) Take 1 tablet by mouth daily. Reported on 12/22/2015    . glucose blood (ACCU-CHEK AVIVA PLUS) test strip 1 each by Other route 2 (two) times daily. Use  as instructed 100 each 0  . hydrochlorothiazide (HYDRODIURIL) 25 MG tablet Take 1 tablet (25 mg total) by mouth daily. 90 tablet 3  . latanoprost (XALATAN) 0.005 % ophthalmic solution Place 1 drop into both eyes daily.  3  . lisinopril (PRINIVIL,ZESTRIL) 20 MG tablet Take 1 tablet (20 mg total) by mouth daily. 90 tablet 1  . OVER THE COUNTER MEDICATION Vita Fusion Fiber- 2 daily    . Probiotic Product (PROBIOTIC DAILY PO) Take 1 capsule by mouth daily.    . rosuvastatin (CRESTOR) 10 MG tablet Take 1 tablet (10 mg total) by mouth daily. 90 tablet 3   No facility-administered medications prior to visit.     Allergies  Allergen Reactions  . Codeine Nausea And Vomiting    Review of Systems  Constitutional: Negative for fever and malaise/fatigue.  HENT: Negative for congestion.   Eyes: Negative for blurred vision.  Respiratory: Negative for cough.   Cardiovascular: Negative for palpitations and leg swelling.  Gastrointestinal: Negative for vomiting.  Musculoskeletal: Negative for back pain.  Skin: Negative for rash.  Neurological: Negative for loss of consciousness and headaches.  Psychiatric/Behavioral: The patient is nervous/anxious.        Objective:    Physical Exam  Constitutional: She is oriented to person, place, and time. She appears well-developed and well-nourished. No distress.  HENT:  Head: Normocephalic and atraumatic.  Eyes: Conjunctivae are normal.  Neck: Normal range of motion. No thyromegaly present.  Cardiovascular: Normal rate and regular rhythm.   Pulmonary/Chest: Effort normal and breath sounds normal. She has no wheezes.  Abdominal: Soft. Bowel sounds are normal. There is no tenderness.  Musculoskeletal: Normal range of motion. She exhibits no edema or deformity.  Neurological: She is alert and oriented to person, place, and time.  Skin: Skin is warm and dry. She is not diaphoretic.  Psychiatric: She has a normal mood and affect.    BP 102/62 (BP  Location: Left Arm, Patient Position: Sitting, Cuff Size: Normal)   Pulse 65   Temp 98.1 F (36.7 C) (Oral)   Resp 18   Ht 5\' 2"  (1.575 m)   Wt 154 lb 3.2 oz (69.9 kg)   SpO2 98%   BMI 28.20 kg/m  Wt Readings from Last 3 Encounters:  11/20/16 154 lb 3.2 oz (69.9 kg)  05/23/16 156 lb (70.8 kg)  04/26/16 150 lb (68 kg)   BP Readings from Last 3 Encounters:  11/20/16 102/62  05/23/16 100/62  04/26/16 118/60     Immunization History  Administered Date(s) Administered  . Influenza Split 06/13/2012  . Influenza Whole 04/06/2010, 05/31/2011  . Influenza, High Dose Seasonal PF 05/29/2016  . Influenza,inj,Quad PF,36+ Mos 05/21/2014, 05/31/2015  . Influenza-Unspecified 06/06/2013  . PPD Test 10/09/2016  . Pneumococcal Conjugate-13 12/21/2013  . Pneumococcal Polysaccharide-23 04/06/2010, 05/31/2015  . Tdap 09/11/2011  . Zoster 06/06/2013  Health Maintenance  Topic Date Due  . FOOT EXAM  12/21/2016  . INFLUENZA VACCINE  03/06/2017  . MAMMOGRAM  03/06/2017  . HEMOGLOBIN A1C  05/17/2017  . OPHTHALMOLOGY EXAM  07/18/2017  . TETANUS/TDAP  09/10/2021  . COLONOSCOPY  08/06/2022  . DEXA SCAN  Completed  . Hepatitis C Screening  Completed  . PNA vac Low Risk Adult  Completed    Lab Results  Component Value Date   WBC 7.3 11/15/2016   HGB 14.2 11/15/2016   HCT 42.7 11/15/2016   PLT 207.0 11/15/2016   GLUCOSE 135 (H) 11/15/2016   CHOL 153 11/15/2016   TRIG 131.0 11/15/2016   HDL 58.00 11/15/2016   LDLDIRECT 182.4 09/11/2011   LDLCALC 69 11/15/2016   ALT 18 11/15/2016   AST 22 11/15/2016   NA 134 (L) 11/15/2016   K 4.4 11/15/2016   CL 98 11/15/2016   CREATININE 0.82 11/15/2016   BUN 19 11/15/2016   CO2 30 11/15/2016   TSH 2.83 11/15/2016   INR 0.9 09/11/2011   HGBA1C 7.0 (H) 11/15/2016    Lab Results  Component Value Date   TSH 2.83 11/15/2016   Lab Results  Component Value Date   WBC 7.3 11/15/2016   HGB 14.2 11/15/2016   HCT 42.7 11/15/2016   MCV 87.9  11/15/2016   PLT 207.0 11/15/2016   Lab Results  Component Value Date   NA 134 (L) 11/15/2016   K 4.4 11/15/2016   CO2 30 11/15/2016   GLUCOSE 135 (H) 11/15/2016   BUN 19 11/15/2016   CREATININE 0.82 11/15/2016   BILITOT 0.5 11/15/2016   ALKPHOS 62 11/15/2016   AST 22 11/15/2016   ALT 18 11/15/2016   PROT 7.4 11/15/2016   ALBUMIN 4.2 11/15/2016   CALCIUM 9.7 11/15/2016   GFR 72.92 11/15/2016   Lab Results  Component Value Date   CHOL 153 11/15/2016   Lab Results  Component Value Date   HDL 58.00 11/15/2016   Lab Results  Component Value Date   LDLCALC 69 11/15/2016   Lab Results  Component Value Date   TRIG 131.0 11/15/2016   Lab Results  Component Value Date   CHOLHDL 3 11/15/2016   Lab Results  Component Value Date   HGBA1C 7.0 (H) 11/15/2016         Assessment & Plan:   Problem List Items Addressed This Visit    DM (diabetes mellitus), type 2 with neurological complications ([place])    ERXV4M acceptable, minimize simple carbs. Increase exercise as tolerated. Continue current meds      Relevant Orders   Hemoglobin A1c   GOUT, UNSPECIFIED    Stay well hydrated Uric acid up slightly      Relevant Orders   Uric acid   TSH   ESSENTIAL HYPERTENSION, BENIGN - Primary    Well controlled, no changes to meds. Encouraged heart healthy diet such as the DASH diet and exercise as tolerated.       Relevant Orders   CBC   Comprehensive metabolic panel   TSH   Hyperlipidemia    Encouraged heart healthy diet, increase exercise, avoid trans fats, consider a krill oil cap daily      Relevant Orders   Lipid panel   Medicare annual wellness visit, subsequent    Patient denies any difficulties at home. No trouble with ADLs, depression or falls. See EMR for functional status screen and depression screen. No recent changes to vision or hearing. Is UTD with immunizations. Is  UTD with screening. Discussed Advanced Directives. Encouraged heart healthy diet, exercise  as tolerated and adequate sleep. See patient's problem list for health risk factors to monitor. See AVS for preventative healthcare recommendation schedule.         I am having Ms. Hornak maintain her B Complex Vitamins (VITAMIN-B COMPLEX PO), aspirin, OVER THE COUNTER MEDICATION, Probiotic Product (PROBIOTIC DAILY PO), glucose blood, latanoprost, hydrochlorothiazide, rosuvastatin, and lisinopril.  No orders of the defined types were placed in this encounter.   CMA served as Education administrator during this visit. History, Physical and Plan performed by medical provider. Documentation and orders reviewed and attested to.  Penni Homans, MD

## 2016-11-20 NOTE — Progress Notes (Signed)
Pre visit review using our clinic review tool, if applicable. No additional management support is needed unless otherwise documented below in the visit note. 

## 2016-11-20 NOTE — Assessment & Plan Note (Signed)
hgba1c acceptable, minimize simple carbs. Increase exercise as tolerated. Continue current meds 

## 2017-01-21 ENCOUNTER — Other Ambulatory Visit: Payer: Self-pay | Admitting: Family Medicine

## 2017-03-21 ENCOUNTER — Encounter: Payer: Self-pay | Admitting: Family Medicine

## 2017-03-21 MED ORDER — GLUCOSE BLOOD VI STRP: ORAL_STRIP | 12 refills | Status: DC

## 2017-03-22 ENCOUNTER — Other Ambulatory Visit: Payer: Self-pay | Admitting: Family Medicine

## 2017-03-26 ENCOUNTER — Other Ambulatory Visit: Payer: Self-pay

## 2017-03-26 MED ORDER — GLUCOSE BLOOD VI STRP: ORAL_STRIP | 12 refills | Status: DC

## 2017-05-12 ENCOUNTER — Other Ambulatory Visit: Payer: Self-pay | Admitting: Family Medicine

## 2017-05-16 ENCOUNTER — Other Ambulatory Visit (INDEPENDENT_AMBULATORY_CARE_PROVIDER_SITE_OTHER): Payer: Medicare Other

## 2017-05-16 DIAGNOSIS — M109 Gout, unspecified: Secondary | ICD-10-CM | POA: Diagnosis not present

## 2017-05-16 DIAGNOSIS — E785 Hyperlipidemia, unspecified: Secondary | ICD-10-CM | POA: Diagnosis not present

## 2017-05-16 DIAGNOSIS — I10 Essential (primary) hypertension: Secondary | ICD-10-CM | POA: Diagnosis not present

## 2017-05-16 DIAGNOSIS — E1149 Type 2 diabetes mellitus with other diabetic neurological complication: Secondary | ICD-10-CM | POA: Diagnosis not present

## 2017-05-16 LAB — COMPREHENSIVE METABOLIC PANEL
ALT: 18 U/L (ref 0–35)
AST: 24 U/L (ref 0–37)
Albumin: 4.2 g/dL (ref 3.5–5.2)
Alkaline Phosphatase: 62 U/L (ref 39–117)
BUN: 14 mg/dL (ref 6–23)
CALCIUM: 9.1 mg/dL (ref 8.4–10.5)
CHLORIDE: 98 meq/L (ref 96–112)
CO2: 31 meq/L (ref 19–32)
CREATININE: 0.78 mg/dL (ref 0.40–1.20)
GFR: 77.14 mL/min (ref >60.00)
Glucose, Bld: 140 mg/dL — ABNORMAL HIGH (ref 70–99)
Potassium: 4.1 mEq/L (ref 3.5–5.1)
SODIUM: 136 meq/L (ref 135–145)
Total Bilirubin: 0.5 mg/dL (ref 0.2–1.2)
Total Protein: 7.1 g/dL (ref 6.0–8.3)

## 2017-05-16 LAB — LIPID PANEL
CHOL/HDL RATIO: 3
Cholesterol: 144 mg/dL (ref 0–200)
HDL: 48.6 mg/dL (ref >39.00)
LDL CALC: 70 mg/dL (ref 0–99)
NonHDL: 95.18
TRIGLYCERIDES: 124 mg/dL (ref 0.0–149.0)
VLDL: 24.8 mg/dL (ref 0.0–40.0)

## 2017-05-16 LAB — CBC
HCT: 43.8 % (ref 36.0–46.0)
Hemoglobin: 14.5 g/dL (ref 12.0–15.0)
MCHC: 33.1 g/dL (ref 30.0–36.0)
MCV: 88.7 fl (ref 78.0–100.0)
Platelets: 221 10*3/uL (ref 150.0–400.0)
RBC: 4.94 Mil/uL (ref 3.87–5.11)
RDW: 13.8 % (ref 11.5–15.5)
WBC: 6 10*3/uL (ref 4.0–10.5)

## 2017-05-16 LAB — TSH: TSH: 2.88 u[IU]/mL (ref 0.35–4.50)

## 2017-05-16 LAB — URIC ACID: Uric Acid, Serum: 7.2 mg/dL — ABNORMAL HIGH (ref 2.4–7.0)

## 2017-05-16 LAB — HEMOGLOBIN A1C: Hgb A1c MFr Bld: 7 % — ABNORMAL HIGH (ref 4.6–6.5)

## 2017-05-23 ENCOUNTER — Ambulatory Visit (INDEPENDENT_AMBULATORY_CARE_PROVIDER_SITE_OTHER): Payer: Medicare Other | Admitting: Family Medicine

## 2017-05-23 ENCOUNTER — Encounter: Payer: Self-pay | Admitting: Family Medicine

## 2017-05-23 VITALS — BP 110/62 | HR 63 | Temp 98.0°F | Resp 18 | Wt 156.4 lb

## 2017-05-23 DIAGNOSIS — Z23 Encounter for immunization: Secondary | ICD-10-CM | POA: Diagnosis not present

## 2017-05-23 DIAGNOSIS — E1149 Type 2 diabetes mellitus with other diabetic neurological complication: Secondary | ICD-10-CM | POA: Diagnosis not present

## 2017-05-23 DIAGNOSIS — E785 Hyperlipidemia, unspecified: Secondary | ICD-10-CM | POA: Diagnosis not present

## 2017-05-23 DIAGNOSIS — M109 Gout, unspecified: Secondary | ICD-10-CM | POA: Diagnosis not present

## 2017-05-23 DIAGNOSIS — I10 Essential (primary) hypertension: Secondary | ICD-10-CM

## 2017-05-23 NOTE — Progress Notes (Signed)
Subjective:  I acted as a Education administrator for Dr. Charlett Blake. [place], [place]  Patient ID: Andrea Calderon, female    DOB: [birth year], 71 y.o.   MRN: [id number]  No chief complaint on file.   HPI  Patient is in today for a 6 month follow up. She is following up on HTN, DM and other medical concerns. She presently has no acute concerns. No recent febrile illness or acute hospitalizations. Denies CP/palp/SOB/HA/congestion/fevers/GI or GU c/o. Taking meds as prescribed. She feels well. She had a recent flare in her left toe of the gout but one dose of Naproxen took care of it.    Patient Care Team: [name], MD as PCP - General (Family Medicine) Maralyn Sago, MD as Referring Physician (Gynecologic Oncology) Bloomfeld, Sharen Hint, MD as Referring Physician (Gastroenterology) Kathlen Brunswick., MD as Referring Physician (Cardiology) Leonia Corona, MD as Referring Physician (Ophthalmology) Murvin Donning, MD as Consulting Physician (Dermatology)   Past Medical History:  Diagnosis Date  . BCC (basal cell carcinoma), face 12/05/2014   Removed from right side of [facility] Altamese Cabal and Dr Ferdinand Lango  . Cancer (HCC)    BCC right nose  . Diabetes mellitus   . H/O: stroke 09/11/2011   September 2012 acute right retinal artery occlusion H/o left retinal artery embolic event by imaging H/o blood clot in port during chemotherapy   . Multiple thyroid nodules 06/13/2012  . Oral lichen planus 01/08/4649  . Other and unspecified hyperlipidemia 12/16/2012  . Preventative health care 06/21/2013  . Rosacea   . Valvular heart disease 06/13/2012   Cardiologist Dr Darral Dash of [facility] in [facility]    Past Surgical History:  Procedure Laterality Date  . ABDOMINAL HYSTERECTOMY  2009   total  . COLECTOMY  2009  . MOHS SURGERY Right    for [facility] on right nose, Feb 2016  . TONGUE BIOPSY  01/05/15   negative results  . VENTRAL HERNIA REPAIR  11/11   4 lesions  found, mesh left in place    Family History  Problem Relation Age of Onset  . Transient ischemic attack Mother   . Hypertension Mother   . Dementia Mother   . Diabetes Father        borderline sugar  . Coronary artery disease Father   . Heart disease Maternal Grandfather   . Heart disease Paternal Grandfather     Social History   Social History  . Marital status: Married    Spouse name: N/A  . Number of children: N/A  . Years of education: N/A   Occupational History  . Not on file.   Social History Main Topics  . Smoking status: Never Smoker  . Smokeless tobacco: Never Used  . Alcohol use 0.0 oz/week     Comment: rare, social  . Drug use: No  . Sexual activity: Yes   Other Topics Concern  . Not on file   Social History Narrative  . No narrative on file    Outpatient Medications Prior to Visit  Medication Sig Dispense Refill  . aspirin 81 MG tablet Take 81 mg by mouth daily.    . B Complex Vitamins (VITAMIN-B COMPLEX PO) Take 1 tablet by mouth daily. Reported on 12/22/2015    . glucose blood (ACCU-CHEK AVIVA PLUS) test strip Check blood sugars twice daily 100 each 12  . hydrochlorothiazide (HYDRODIURIL) 25 MG tablet Take 1 tablet (25 mg total) by  mouth daily. 90 tablet 3  . latanoprost (XALATAN) 0.005 % ophthalmic solution Place 1 drop into both eyes daily.  3  . lisinopril (PRINIVIL,ZESTRIL) 20 MG tablet TAKE 1 TABLET(20 MG) BY MOUTH DAILY 90 tablet 0  . OVER THE COUNTER MEDICATION Vita Fusion Fiber- 2 daily    . Probiotic Product (PROBIOTIC DAILY PO) Take 1 capsule by mouth daily.    . rosuvastatin (CRESTOR) 10 MG tablet Take 1 tablet (10 mg total) by mouth daily. 90 tablet 3   No facility-administered medications prior to visit.     Allergies  Allergen Reactions  . Codeine Nausea And Vomiting    Review of Systems  Constitutional: Negative for fever and malaise/fatigue.  HENT: Negative for congestion.   Eyes: Negative for blurred vision.  Respiratory:  Negative for cough and shortness of breath.   Cardiovascular: Negative for chest pain, palpitations and leg swelling.  Gastrointestinal: Negative for vomiting.  Musculoskeletal: Negative for back pain.  Skin: Negative for rash.  Neurological: Negative for loss of consciousness and headaches.       Objective:    Physical Exam  Constitutional: She is oriented to person, place, and time. She appears well-developed and well-nourished. No distress.  HENT:  Head: Normocephalic and atraumatic.  Eyes: Conjunctivae are normal.  Neck: Normal range of motion. No thyromegaly present.  Cardiovascular: Normal rate and regular rhythm.   Pulmonary/Chest: Effort normal and breath sounds normal. She has no wheezes.  Abdominal: Soft. Bowel sounds are normal. There is no tenderness.  Musculoskeletal: Normal range of motion. She exhibits no edema or deformity.  Neurological: She is alert and oriented to person, place, and time.  Skin: Skin is warm and dry. She is not diaphoretic.  Psychiatric: She has a normal mood and affect.    BP 110/62 (BP Location: Left Arm, Patient Position: Sitting, Cuff Size: Normal)   Pulse 63   Temp 98 F (36.7 C) (Oral)   Resp 18   Wt 156 lb 6.4 oz (70.9 kg)   SpO2 98%   BMI 28.61 kg/m  Wt Readings from Last 3 Encounters:  05/23/17 156 lb 6.4 oz (70.9 kg)  11/20/16 154 lb 3.2 oz (69.9 kg)  05/23/16 156 lb (70.8 kg)   BP Readings from Last 3 Encounters:  05/23/17 110/62  11/20/16 102/62  05/23/16 100/62     Immunization History  Administered Date(s) Administered  . Influenza Split 06/13/2012  . Influenza Whole 04/06/2010, 05/31/2011  . Influenza, High Dose Seasonal PF 05/29/2016, 05/23/2017  . Influenza,inj,Quad PF,6+ Mos 05/21/2014, 05/31/2015  . Influenza-Unspecified 06/06/2013  . PPD Test 10/09/2016  . Pneumococcal Conjugate-13 12/21/2013  . Pneumococcal Polysaccharide-23 04/06/2010, 05/31/2015  . Tdap 09/11/2011  . Zoster 06/06/2013    Health  Maintenance  Topic Date Due  . FOOT EXAM  12/21/2016  . INFLUENZA VACCINE  03/06/2017  . MAMMOGRAM  03/06/2017  . OPHTHALMOLOGY EXAM  07/18/2017  . HEMOGLOBIN A1C  11/14/2017  . TETANUS/TDAP  09/10/2021  . COLONOSCOPY  08/06/2022  . DEXA SCAN  Completed  . Hepatitis C Screening  Completed  . PNA vac Low Risk Adult  Completed    Lab Results  Component Value Date   WBC 6.0 05/16/2017   HGB 14.5 05/16/2017   HCT 43.8 05/16/2017   PLT 221.0 05/16/2017   GLUCOSE 140 (H) 05/16/2017   CHOL 144 05/16/2017   TRIG 124.0 05/16/2017   HDL 48.60 05/16/2017   LDLDIRECT 182.4 09/11/2011   LDLCALC 70 05/16/2017   ALT 18 05/16/2017  AST 24 05/16/2017   NA 136 05/16/2017   K 4.1 05/16/2017   CL 98 05/16/2017   CREATININE 0.78 05/16/2017   BUN 14 05/16/2017   CO2 31 05/16/2017   TSH 2.88 05/16/2017   INR 0.9 09/11/2011   HGBA1C 7.0 (H) 05/16/2017    Lab Results  Component Value Date   TSH 2.88 05/16/2017   Lab Results  Component Value Date   WBC 6.0 05/16/2017   HGB 14.5 05/16/2017   HCT 43.8 05/16/2017   MCV 88.7 05/16/2017   PLT 221.0 05/16/2017   Lab Results  Component Value Date   NA 136 05/16/2017   K 4.1 05/16/2017   CO2 31 05/16/2017   GLUCOSE 140 (H) 05/16/2017   BUN 14 05/16/2017   CREATININE 0.78 05/16/2017   BILITOT 0.5 05/16/2017   ALKPHOS 62 05/16/2017   AST 24 05/16/2017   ALT 18 05/16/2017   PROT 7.1 05/16/2017   ALBUMIN 4.2 05/16/2017   CALCIUM 9.1 05/16/2017   GFR 77.14 05/16/2017   Lab Results  Component Value Date   CHOL 144 05/16/2017   Lab Results  Component Value Date   HDL 48.60 05/16/2017   Lab Results  Component Value Date   LDLCALC 70 05/16/2017   Lab Results  Component Value Date   TRIG 124.0 05/16/2017   Lab Results  Component Value Date   CHOLHDL 3 05/16/2017   Lab Results  Component Value Date   HGBA1C 7.0 (H) 05/16/2017         Assessment & Plan:   Problem List Items Addressed This Visit    DM (diabetes  mellitus), type 2 with neurological complications ([place])    [id number] acceptable, minimize simple carbs. Increase exercise as tolerated. Continue current meds      Relevant Orders   Hemoglobin A1c   GOUT, UNSPECIFIED    Left big toe was flared some this week put Naproxen helped and has not recurred. Encouraged 64 oz of fluids every day.       Relevant Orders   Uric acid   ESSENTIAL HYPERTENSION, BENIGN    Well controlled, no changes to meds. Encouraged heart healthy diet such as the DASH diet and exercise as tolerated.       Relevant Orders   CBC   Comprehensive metabolic panel   TSH   Hyperlipidemia    Tolerating statin, encouraged heart healthy diet, avoid trans fats, minimize simple carbs and saturated fats. Increase exercise as tolerated      Relevant Orders   Lipid panel    Other Visit Diagnoses    Needs flu shot    -  Primary   Relevant Orders   Flu vaccine HIGH DOSE PF (Fluzone High dose) (Completed)      I am having Ms. Vanzile maintain her B Complex Vitamins (VITAMIN-B COMPLEX PO), aspirin, OVER THE COUNTER MEDICATION, Probiotic Product (PROBIOTIC DAILY PO), latanoprost, hydrochlorothiazide, rosuvastatin, glucose blood, and lisinopril.  No orders of the defined types were placed in this encounter.   CMA served as Education administrator during this visit. History, Physical and Plan performed by medical provider. Documentation and orders reviewed and attested to.  Penni Homans, MD

## 2017-05-23 NOTE — Assessment & Plan Note (Signed)
Left big toe was flared some this week put Naproxen helped and has not recurred. Encouraged 64 oz of fluids every day.

## 2017-05-23 NOTE — Assessment & Plan Note (Signed)
hgba1c acceptable, minimize simple carbs. Increase exercise as tolerated. Continue current meds 

## 2017-05-23 NOTE — Assessment & Plan Note (Signed)
Well controlled, no changes to meds. Encouraged heart healthy diet such as the DASH diet and exercise as tolerated.  °

## 2017-05-23 NOTE — Assessment & Plan Note (Signed)
Tolerating statin, encouraged heart healthy diet, avoid trans fats, minimize simple carbs and saturated fats. Increase exercise as tolerated 

## 2017-05-23 NOTE — Patient Instructions (Addendum)
64 oz of clear fluids Caffeine and alcohol work against the total so for every one of these you extra glass plus 64 oz.   Shingrix is the new shingles shot, 2 shots over 2-6 months. At pharmacy. Have them send record to [place]. Gout Gout is painful swelling that can occur in some of your joints. Gout is a type of arthritis. This condition is caused by having too much uric acid in your body. Uric acid is a chemical that forms when your body breaks down substances called purines. Purines are important for building body proteins. When your body has too much uric acid, sharp crystals can form and build up inside your joints. This causes pain and swelling. Gout attacks can happen quickly and be very painful (acute gout). Over time, the attacks can affect more joints and become more frequent (chronic gout). Gout can also cause uric acid to build up under your skin and inside your kidneys. What are the causes? This condition is caused by too much uric acid in your blood. This can occur because:  Your kidneys do not remove enough uric acid from your blood. This is the most common cause.  Your body makes too much uric acid. This can occur with some cancers and cancer treatments. It can also occur if your body is breaking down too many red blood cells (hemolytic anemia).  You eat too many foods that are high in purines. These foods include organ meats and some seafood. Alcohol, especially beer, is also high in purines.  A gout attack may be triggered by trauma or stress. What increases the risk? This condition is more likely to develop in people who:  Have a family history of gout.  Are female and middle-aged.  Are female and have gone through menopause.  Are obese.  Frequently drink alcohol, especially beer.  Are dehydrated.  Lose weight too quickly.  Have an organ transplant.  Have lead poisoning.  Take certain medicines, including aspirin, cyclosporine, diuretics, levodopa, and  niacin.  Have kidney disease or psoriasis.  What are the signs or symptoms? An attack of acute gout happens quickly. It usually occurs in just one joint. The most common place is the big toe. Attacks often start at night. Other joints that may be affected include joints of the feet, ankle, knee, fingers, wrist, or elbow. Symptoms may include:  Severe pain.  Warmth.  Swelling.  Stiffness.  Tenderness. The affected joint may be very painful to touch.  Shiny, red, or purple skin.  Chills and fever.  Chronic gout may cause symptoms more frequently. More joints may be involved. You may also have white or yellow lumps (tophi) on your hands or feet or in other areas near your joints. How is this diagnosed? This condition is diagnosed based on your symptoms, medical history, and physical exam. You may have tests, such as:  Blood tests to measure uric acid levels.  Removal of joint fluid with a needle (aspiration) to look for uric acid crystals.  X-rays to look for joint damage.  How is this treated? Treatment for this condition has two phases: treating an acute attack and preventing future attacks. Acute gout treatment may include medicines to reduce pain and swelling, including:  NSAIDs.  Steroids. These are strong anti-inflammatory medicines that can be taken by mouth (orally) or injected into a joint.  Colchicine. This medicine relieves pain and swelling when it is taken soon after an attack. It can be given orally or through an IV  tube.  Preventive treatment may include:  Daily use of smaller doses of NSAIDs or colchicine.  Use of a medicine that reduces uric acid levels in your blood.  Changes to your diet. You may need to see a specialist about healthy eating (dietitian).  Follow these instructions at home: During a Gout Attack  If directed, apply ice to the affected area: ? Put ice in a plastic bag. ? Place a towel between your skin and the bag. ? Leave the ice on  for 20 minutes, 2-3 times a day.  Rest the joint as much as possible. If the affected joint is in your leg, you may be given crutches to use.  Raise (elevate) the affected joint above the level of your heart as often as possible.  Drink enough fluids to keep your urine clear or pale yellow.  Take over-the-counter and prescription medicines only as told by your health care provider.  Do not drive or operate heavy machinery while taking prescription pain medicine.  Follow instructions from your health care provider about eating or drinking restrictions.  Return to your normal activities as told by your health care provider. Ask your health care provider what activities are safe for you. Avoiding Future Gout Attacks  Follow a low-purine diet as told by your dietitian or health care provider. Avoid foods and drinks that are high in purines, including liver, kidney, anchovies, asparagus, herring, mushrooms, mussels, and beer.  Limit alcohol intake to no more than 1 drink a day for nonpregnant women and 2 drinks a day for men. One drink equals 12 oz of beer, 5 oz of wine, or 1 oz of hard liquor.  Maintain a healthy weight or lose weight if you are overweight. If you want to lose weight, talk with your health care provider. It is important that you do not lose weight too quickly.  Start or maintain an exercise program as told by your health care provider.  Drink enough fluids to keep your urine clear or pale yellow.  Take over-the-counter and prescription medicines only as told by your health care provider.  Keep all follow-up visits as told by your health care provider. This is important. Contact a health care provider if:  You have another gout attack.  You continue to have symptoms of a gout attack after10 days of treatment.  You have side effects from your medicines.  You have chills or a fever.  You have burning pain when you urinate.  You have pain in your lower back or  belly. Get help right away if:  You have severe or uncontrolled pain.  You cannot urinate. This information is not intended to replace advice given to you by your health care provider. Make sure you discuss any questions you have with your health care provider. Document Released: 07/20/2000 Document Revised: 12/29/2015 Document Reviewed: 05/05/2015 Elsevier Interactive Patient Education  2017 [place] are compounds that affect the level of uric acid in your body. A low-purine diet is a diet that is low in purines. Eating a low-purine diet can prevent the level of uric acid in your body from getting too high and causing gout or kidney stones or both. What do I need to know about this diet?  Choose low-purine foods. Examples of low-purine foods are listed in the next section.  Drink plenty of fluids, especially water. Fluids can help remove uric acid from your body. Try to drink 8-16 cups (1.9-3.8 L) a day.  Limit foods  high in fat, especially saturated fat, as fat makes it harder for the body to get rid of uric acid. Foods high in saturated fat include pizza, cheese, ice cream, whole milk, fried foods, and gravies. Choose foods that are lower in fat and lean sources of protein. Use olive oil when cooking as it contains healthy fats that are not high in saturated fat.  Limit alcohol. Alcohol interferes with the elimination of uric acid from your body. If you are having a gout attack, avoid all alcohol.  Keep in mind that different people's bodies react differently to different foods. You will probably learn over time which foods do or do not affect you. If you discover that a food tends to cause your gout to flare up, avoid eating that food. You can more freely enjoy foods that do not cause problems. If you have any questions about a food item, talk to your dietitian or health care provider. Which foods are low, moderate, and high in purines? The following is a list  of foods that are low, moderate, and high in purines. You can eat any amount of the foods that are low in purines. You may be able to have small amounts of foods that are moderate in purines. Ask your health care provider how much of a food moderate in purines you can have. Avoid foods high in purines. Grains  Foods low in purines: Enriched white bread, pasta, rice, cake, cornbread, popcorn.  Foods moderate in purines: Whole-grain breads and cereals, wheat germ, bran, oatmeal. Uncooked oatmeal. Dry wheat bran or wheat germ.  Foods high in purines: Pancakes, [place] toast, biscuits, muffins. Vegetables  Foods low in purines: All vegetables, except those that are moderate in purines.  Foods moderate in purines: Asparagus, cauliflower, spinach, mushrooms, green peas. Fruits  All fruits are low in purines. Meats and other Protein Foods  Foods low in purines: Eggs, nuts, peanut butter.  Foods moderate in purines: 80-90% lean beef, lamb, veal, pork, poultry, fish, eggs, peanut butter, nuts. Crab, lobster, oysters, and shrimp. Cooked dried beans, peas, and lentils.  Foods high in purines: Anchovies, sardines, herring, mussels, tuna, codfish, scallops, trout, and haddock. Berniece Salines. Organ meats (such as liver or kidney). [place]. Game meat. Goose. Sweetbreads. Dairy  All dairy foods are low in purines. Low-fat and fat-free dairy products are best because they are low in saturated fat. Beverages  Drinks low in purines: Water, carbonated beverages, tea, coffee, cocoa.  Drinks moderate in purines: Soft drinks and other drinks sweetened with high-fructose corn syrup. Juices. To find whether a food or drink is sweetened with high-fructose corn syrup, look at the ingredients list.  Drinks high in purines: Alcoholic beverages (such as beer). Condiments  Foods low in purines: Salt, herbs, olives, pickles, relishes, vinegar.  Foods moderate in purines: Butter, margarine, oils, mayonnaise. Fats and  Oils  Foods low in purines: All types, except gravies and sauces made with meat.  Foods high in purines: Gravies and sauces made with meat. Other Foods  Foods low in purines: Sugars, sweets, gelatin. Cake. Soups made without meat.  Foods moderate in purines: Meat-based or fish-based soups, broths, or bouillons. Foods and drinks sweetened with high-fructose corn syrup.  Foods high in purines: High-fat desserts (such as ice cream, cookies, cakes, pies, doughnuts, and chocolate). Contact your dietitian for more information on foods that are not listed here. This information is not intended to replace advice given to you by your health care provider. Make sure you discuss any  questions you have with your health care provider. Document Released: 11/17/2010 Document Revised: 12/29/2015 Document Reviewed: 06/29/2013 Elsevier Interactive Patient Education  2017 [employer].

## 2017-05-28 ENCOUNTER — Telehealth: Payer: Self-pay | Admitting: *Deleted

## 2017-05-28 NOTE — Telephone Encounter (Signed)
Received request for Physician Progress Notes for Medicare coverage on Diabetes Test Strips for testing twice daily, printed last two office notes with labs, to complete required list of information contained in them with marked line for provider to sign & date on last page of each [required]; forwarded to provider/SLS 10/23

## 2017-07-10 LAB — HM DIABETES EYE EXAM

## 2017-07-18 ENCOUNTER — Encounter: Payer: Self-pay | Admitting: Family Medicine

## 2017-08-24 ENCOUNTER — Other Ambulatory Visit: Payer: Self-pay | Admitting: Family Medicine

## 2017-08-25 ENCOUNTER — Other Ambulatory Visit: Payer: Self-pay | Admitting: Family Medicine

## 2017-08-26 MED ORDER — LISINOPRIL 20 MG PO TABS: 20.0000 mg | ORAL_TABLET | Freq: Every day | ORAL | 0 refills | Status: DC

## 2017-11-18 ENCOUNTER — Other Ambulatory Visit: Payer: Self-pay | Admitting: Family Medicine

## 2017-12-17 ENCOUNTER — Other Ambulatory Visit (INDEPENDENT_AMBULATORY_CARE_PROVIDER_SITE_OTHER): Payer: Medicare Other

## 2017-12-17 DIAGNOSIS — E1149 Type 2 diabetes mellitus with other diabetic neurological complication: Secondary | ICD-10-CM

## 2017-12-17 DIAGNOSIS — I10 Essential (primary) hypertension: Secondary | ICD-10-CM | POA: Diagnosis not present

## 2017-12-17 DIAGNOSIS — E785 Hyperlipidemia, unspecified: Secondary | ICD-10-CM | POA: Diagnosis not present

## 2017-12-17 DIAGNOSIS — M109 Gout, unspecified: Secondary | ICD-10-CM

## 2017-12-17 LAB — CBC
HCT: 43.3 % (ref 36.0–46.0)
HEMOGLOBIN: 14.6 g/dL (ref 12.0–15.0)
MCHC: 33.7 g/dL (ref 30.0–36.0)
MCV: 87.8 fl (ref 78.0–100.0)
PLATELETS: 229 10*3/uL (ref 150.0–400.0)
RBC: 4.93 Mil/uL (ref 3.87–5.11)
RDW: 14.2 % (ref 11.5–15.5)
WBC: 6.5 10*3/uL (ref 4.0–10.5)

## 2017-12-17 LAB — COMPREHENSIVE METABOLIC PANEL
ALT: 14 U/L (ref 0–35)
AST: 20 U/L (ref 0–37)
Albumin: 4.3 g/dL (ref 3.5–5.2)
Alkaline Phosphatase: 64 U/L (ref 39–117)
BILIRUBIN TOTAL: 0.3 mg/dL (ref 0.2–1.2)
BUN: 17 mg/dL (ref 6–23)
CO2: 31 meq/L (ref 19–32)
Calcium: 9.7 mg/dL (ref 8.4–10.5)
Chloride: 97 mEq/L (ref 96–112)
Creatinine, Ser: 0.84 mg/dL (ref 0.40–1.20)
GFR: 70.7 mL/min (ref >60.00)
GLUCOSE: 144 mg/dL — AB (ref 70–99)
Potassium: 5 mEq/L (ref 3.5–5.1)
SODIUM: 137 meq/L (ref 135–145)
TOTAL PROTEIN: 7.2 g/dL (ref 6.0–8.3)

## 2017-12-17 LAB — LIPID PANEL
CHOL/HDL RATIO: 3
Cholesterol: 147 mg/dL (ref 0–200)
HDL: 52.3 mg/dL (ref >39.00)
LDL Cholesterol: 75 mg/dL (ref 0–99)
NONHDL: 94.66
Triglycerides: 100 mg/dL (ref 0.0–149.0)
VLDL: 20 mg/dL (ref 0.0–40.0)

## 2017-12-17 LAB — URIC ACID: URIC ACID, SERUM: 7.3 mg/dL — AB (ref 2.4–7.0)

## 2017-12-17 LAB — HEMOGLOBIN A1C: Hgb A1c MFr Bld: 7.1 % — ABNORMAL HIGH (ref 4.6–6.5)

## 2017-12-17 LAB — TSH: TSH: 3.18 u[IU]/mL (ref 0.35–4.50)

## 2017-12-24 ENCOUNTER — Ambulatory Visit (INDEPENDENT_AMBULATORY_CARE_PROVIDER_SITE_OTHER): Payer: Medicare Other | Admitting: Family Medicine

## 2017-12-24 ENCOUNTER — Encounter: Payer: Self-pay | Admitting: Family Medicine

## 2017-12-24 DIAGNOSIS — E785 Hyperlipidemia, unspecified: Secondary | ICD-10-CM

## 2017-12-24 DIAGNOSIS — M109 Gout, unspecified: Secondary | ICD-10-CM | POA: Diagnosis not present

## 2017-12-24 DIAGNOSIS — I10 Essential (primary) hypertension: Secondary | ICD-10-CM | POA: Diagnosis not present

## 2017-12-24 DIAGNOSIS — E1149 Type 2 diabetes mellitus with other diabetic neurological complication: Secondary | ICD-10-CM | POA: Diagnosis not present

## 2017-12-24 MED ORDER — GLUCOSE BLOOD VI STRP: ORAL_STRIP | 12 refills | Status: AC

## 2017-12-24 MED ORDER — BLOOD GLUCOSE METER KIT: PACK | 0 refills | Status: AC

## 2017-12-24 NOTE — Patient Instructions (Addendum)
Hydrate well, 64 oz of clear fluids  Shingrix is the new shingles shot, 2 shots over 2-6 months    Low-Purine Diet Purines are compounds that affect the level of uric acid in your body. A low-purine diet is a diet that is low in purines. Eating a low-purine diet can prevent the level of uric acid in your body from getting too high and causing gout or kidney stones or both. What do I need to know about this diet?  Choose low-purine foods. Examples of low-purine foods are listed in the next section.  Drink plenty of fluids, especially water. Fluids can help remove uric acid from your body. Try to drink 8-16 cups (1.9-3.8 L) a day.  Limit foods high in fat, especially saturated fat, as fat makes it harder for the body to get rid of uric acid. Foods high in saturated fat include pizza, cheese, ice cream, whole milk, fried foods, and gravies. Choose foods that are lower in fat and lean sources of protein. Use olive oil when cooking as it contains healthy fats that are not high in saturated fat.  Limit alcohol. Alcohol interferes with the elimination of uric acid from your body. If you are having a gout attack, avoid all alcohol.  Keep in mind that different people's bodies react differently to different foods. You will probably learn over time which foods do or do not affect you. If you discover that a food tends to cause your gout to flare up, avoid eating that food. You can more freely enjoy foods that do not cause problems. If you have any questions about a food item, talk to your dietitian or health care provider. Which foods are low, moderate, and high in purines? The following is a list of foods that are low, moderate, and high in purines. You can eat any amount of the foods that are low in purines. You may be able to have small amounts of foods that are moderate in purines. Ask your health care provider how much of a food moderate in purines you can have. Avoid foods high in  purines. Grains  Foods low in purines: Enriched white bread, pasta, rice, cake, cornbread, popcorn.  Foods moderate in purines: Whole-grain breads and cereals, wheat germ, bran, oatmeal. Uncooked oatmeal. Dry wheat bran or wheat germ.  Foods high in purines: Pancakes, [place] toast, biscuits, muffins. Vegetables  Foods low in purines: All vegetables, except those that are moderate in purines.  Foods moderate in purines: Asparagus, cauliflower, spinach, mushrooms, green peas. Fruits  All fruits are low in purines. Meats and other Protein Foods  Foods low in purines: Eggs, nuts, peanut butter.  Foods moderate in purines: 80-90% lean beef, lamb, veal, pork, poultry, fish, eggs, peanut butter, nuts. Crab, lobster, oysters, and shrimp. Cooked dried beans, peas, and lentils.  Foods high in purines: Anchovies, sardines, herring, mussels, tuna, codfish, scallops, trout, and haddock. Andrea Calder[place]. Organ meats (such as liver or kidney). [place]. Game meat. Goose. Sweetbreads. Dairy  All dairy foods are low in purines. Low-fat and fat-free dairy products are best because they are low in saturated fat. Beverages  Drinks low in purines: Water, carbonated beverages, tea, coffee, cocoa.  Drinks moderate in purines: Soft drinks and other drinks sweetened with high-fructose corn syrup. Juices. To find whether a food or drink is sweetened with high-fructose corn syrup, look at the ingredients list.  Drinks high in purines: Alcoholic beverages (such as beer). Condiments  Foods low in purines: Salt, herbs, olives, pickles, relishes,  vinegar.  Foods moderate in purines: Butter, margarine, oils, mayonnaise. Fats and Oils  Foods low in purines: All types, except gravies and sauces made with meat.  Foods high in purines: Gravies and sauces made with meat. Other Foods  Foods low in purines: Sugars, sweets, gelatin. Cake. Soups made without meat.  Foods moderate in purines: Meat-based or fish-based soups,  broths, or bouillons. Foods and drinks sweetened with high-fructose corn syrup.  Foods high in purines: High-fat desserts (such as ice cream, cookies, cakes, pies, doughnuts, and chocolate). Contact your dietitian for more information on foods that are not listed here. This information is not intended to replace advice given to you by your health care provider. Make sure you discuss any questions you have with your health care provider. Document Released: 11/17/2010 Document Revised: 12/29/2015 Document Reviewed: 06/29/2013 Elsevier Interactive Patient Education  2017 [employer].

## 2017-12-24 NOTE — Assessment & Plan Note (Signed)
hgba1c acceptable, minimize simple carbs. Increase exercise as tolerated. Continue current meds 

## 2017-12-24 NOTE — Progress Notes (Signed)
Subjective:  I acted as a Education administrator for Dr. Charlett Blake. [place], [place]  Patient ID: Andrea Calderon, female    DOB: 1945-10-14, 72 y.o.   MRN: [id number]  No chief complaint on file.   HPI  Patient is in today for 6 month follow up on diabetes, hyperlipidemia and more. No polyuria or polydipsia. She has been told she needs a tooth pulled on the left so she is following with her dentist for this. No fevers or chills. Is also noting she had a couple of tick bites recently one on right hip and one behind right knee. Neither were on for long and no rash or fever noted. She otherwise feels well. Denies CP/palp/SOB/HA/congestion/fevers/GI or GU c/o. Taking meds as prescribed  Patient Care Team: [name], MD as PCP - General (Family Medicine) Maralyn Sago, MD as Referring Physician (Gynecologic Oncology) Bloomfeld, Sharen Hint, MD as Referring Physician (Gastroenterology) Kathlen Brunswick., MD as Referring Physician (Cardiology) Leonia Corona, MD as Referring Physician (Ophthalmology) Murvin Donning, MD as Consulting Physician (Dermatology)   Past Medical History:  Diagnosis Date  . BCC (basal cell carcinoma), face 12/05/2014   Removed from right side of nose [facility] Dr Altamese Cabal and Dr Ferdinand Lango  . Cancer (HCC)    BCC right nose  . Diabetes mellitus   . H/O: stroke 09/11/2011   September 2012 acute right retinal artery occlusion H/o left retinal artery embolic event by imaging H/o blood clot in port during chemotherapy   . Multiple thyroid nodules 06/13/2012  . Oral lichen planus 01/05/8314  . Other and unspecified hyperlipidemia 12/16/2012  . Preventative health care 06/21/2013  . Rosacea   . Valvular heart disease 06/13/2012   Cardiologist Dr Darral Dash of [facility] in [facility]    Past Surgical History:  Procedure Laterality Date  . ABDOMINAL HYSTERECTOMY  2009   total  . COLECTOMY  2009  . MOHS SURGERY Right    for [facility] on right nose, Feb  2016  . TONGUE BIOPSY  01/05/15   negative results  . VENTRAL HERNIA REPAIR  11/11   4 lesions found, mesh left in place    Family History  Problem Relation Age of Onset  . Transient ischemic attack Mother   . Hypertension Mother   . Dementia Mother   . Diabetes Father        borderline sugar  . Coronary artery disease Father   . Heart disease Maternal Grandfather   . Heart disease Paternal Grandfather     Social History   Socioeconomic History  . Marital status: Married    Spouse name: Not on file  . Number of children: Not on file  . Years of education: Not on file  . Highest education level: Not on file  Occupational History  . Not on file  Social Needs  . Financial resource strain: Not on file  . Food insecurity:    Worry: Not on file    Inability: Not on file  . Transportation needs:    Medical: Not on file    Non-medical: Not on file  Tobacco Use  . Smoking status: Never Smoker  . Smokeless tobacco: Never Used  Substance and Sexual Activity  . Alcohol use: Yes    Alcohol/week: 0.0 oz    Comment: rare, social  . Drug use: No  . Sexual activity: Yes  Lifestyle  . Physical activity:    Days per week: Not on  file    Minutes per session: Not on file  . Stress: Not on file  Relationships  . Social connections:    Talks on phone: Not on file    Gets together: Not on file    Attends religious service: Not on file    Active member of club or organization: Not on file    Attends meetings of clubs or organizations: Not on file    Relationship status: Not on file  . Intimate partner violence:    Fear of current or ex partner: Not on file    Emotionally abused: Not on file    Physically abused: Not on file    Forced sexual activity: Not on file  Other Topics Concern  . Not on file  Social History Narrative  . Not on file    Outpatient Medications Prior to Visit  Medication Sig Dispense Refill  . aspirin 81 MG tablet Take 81 mg by mouth daily.    . B  Complex Vitamins (VITAMIN-B COMPLEX PO) Take 1 tablet by mouth daily. Reported on 12/22/2015    . hydrochlorothiazide (HYDRODIURIL) 25 MG tablet TAKE 1 TABLET(25 MG) BY MOUTH DAILY 90 tablet 0  . latanoprost (XALATAN) 0.005 % ophthalmic solution Place 1 drop into both eyes daily.  3  . lisinopril (PRINIVIL,ZESTRIL) 20 MG tablet TAKE 1 TABLET(20 MG) BY MOUTH DAILY 90 tablet 0  . OVER THE COUNTER MEDICATION Vita Fusion Fiber- 2 daily    . Probiotic Product (PROBIOTIC DAILY PO) Take 1 capsule by mouth daily.    . rosuvastatin (CRESTOR) 10 MG tablet TAKE 1 TABLET(10 MG) BY MOUTH DAILY 90 tablet 0  . glucose blood (ACCU-CHEK AVIVA PLUS) test strip Check blood sugars twice daily 100 each 12  . lisinopril (PRINIVIL,ZESTRIL) 20 MG tablet Take 1 tablet (20 mg total) by mouth daily. 90 tablet 0   No facility-administered medications prior to visit.     Allergies  Allergen Reactions  . Codeine Nausea And Vomiting    Review of Systems  Constitutional: Negative for fever and malaise/fatigue.  HENT: Negative for congestion.   Eyes: Negative for blurred vision.  Respiratory: Negative for shortness of breath.   Cardiovascular: Negative for chest pain, palpitations and leg swelling.  Gastrointestinal: Negative for abdominal pain, blood in stool and nausea.  Genitourinary: Negative for dysuria and frequency.  Musculoskeletal: Positive for joint pain. Negative for falls.  Skin: Negative for rash.  Neurological: Negative for dizziness, loss of consciousness and headaches.  Endo/Heme/Allergies: Negative for environmental allergies.  Psychiatric/Behavioral: Negative for depression. The patient is not nervous/anxious.        Objective:    Physical Exam  Constitutional: She is oriented to person, place, and time. No distress.  HENT:  Head: Normocephalic and atraumatic.  Eyes: Conjunctivae are normal.  Neck: Neck supple. No thyromegaly present.  Cardiovascular: Normal rate, regular rhythm and normal  heart sounds.  No murmur heard. Pulmonary/Chest: Effort normal and breath sounds normal. She has no wheezes.  Abdominal: She exhibits no distension and no mass.  Musculoskeletal: She exhibits no edema.  Lymphadenopathy:    She has no cervical adenopathy.  Neurological: She is alert and oriented to person, place, and time.  Skin: Skin is warm and dry. No rash noted. She is not diaphoretic.  Psychiatric: Judgment normal.    BP 102/60 (BP Location: Left Arm, Patient Position: Sitting, Cuff Size: Normal)   Pulse 61   Temp 98.1 F (36.7 C) (Oral)   Resp 18  Wt 153 lb 9.6 oz (69.7 kg)   SpO2 96%   BMI 28.09 kg/m  Wt Readings from Last 3 Encounters:  12/24/17 153 lb 9.6 oz (69.7 kg)  05/23/17 156 lb 6.4 oz (70.9 kg)  11/20/16 154 lb 3.2 oz (69.9 kg)   BP Readings from Last 3 Encounters:  12/24/17 102/60  05/23/17 110/62  11/20/16 102/62     Immunization History  Administered Date(s) Administered  . Influenza Split 06/13/2012  . Influenza Whole 04/06/2010, 05/31/2011  . Influenza, High Dose Seasonal PF 05/29/2016, 05/23/2017  . Influenza,inj,Quad PF,6+ Mos 05/21/2014, 05/31/2015  . Influenza-Unspecified 06/06/2013  . PPD Test 10/09/2016  . Pneumococcal Conjugate-13 12/21/2013  . Pneumococcal Polysaccharide-23 04/06/2010, 05/31/2015  . Tdap 09/11/2011  . Zoster 06/06/2013    Health Maintenance  Topic Date Due  . FOOT EXAM  12/21/2016  . MAMMOGRAM  03/06/2017  . INFLUENZA VACCINE  03/06/2018  . HEMOGLOBIN A1C  06/19/2018  . OPHTHALMOLOGY EXAM  07/10/2018  . TETANUS/TDAP  09/10/2021  . COLONOSCOPY  08/06/2022  . DEXA SCAN  Completed  . Hepatitis C Screening  Completed  . PNA vac Low Risk Adult  Completed    Lab Results  Component Value Date   WBC 6.5 12/17/2017   HGB 14.6 12/17/2017   HCT 43.3 12/17/2017   PLT 229.0 12/17/2017   GLUCOSE 144 (H) 12/17/2017   CHOL 147 12/17/2017   TRIG 100.0 12/17/2017   HDL 52.30 12/17/2017   LDLDIRECT 182.4 09/11/2011    LDLCALC 75 12/17/2017   ALT 14 12/17/2017   AST 20 12/17/2017   NA 137 12/17/2017   K 5.0 12/17/2017   CL 97 12/17/2017   CREATININE 0.84 12/17/2017   BUN 17 12/17/2017   CO2 31 12/17/2017   TSH 3.18 12/17/2017   INR 0.9 09/11/2011   HGBA1C 7.1 (H) 12/17/2017    Lab Results  Component Value Date   TSH 3.18 12/17/2017   Lab Results  Component Value Date   WBC 6.5 12/17/2017   HGB 14.6 12/17/2017   HCT 43.3 12/17/2017   MCV 87.8 12/17/2017   PLT 229.0 12/17/2017   Lab Results  Component Value Date   NA 137 12/17/2017   K 5.0 12/17/2017   CO2 31 12/17/2017   GLUCOSE 144 (H) 12/17/2017   BUN 17 12/17/2017   CREATININE 0.84 12/17/2017   BILITOT 0.3 12/17/2017   ALKPHOS 64 12/17/2017   AST 20 12/17/2017   ALT 14 12/17/2017   PROT 7.2 12/17/2017   ALBUMIN 4.3 12/17/2017   CALCIUM 9.7 12/17/2017   GFR 70.70 12/17/2017   Lab Results  Component Value Date   CHOL 147 12/17/2017   Lab Results  Component Value Date   HDL 52.30 12/17/2017   Lab Results  Component Value Date   LDLCALC 75 12/17/2017   Lab Results  Component Value Date   TRIG 100.0 12/17/2017   Lab Results  Component Value Date   CHOLHDL 3 12/17/2017   Lab Results  Component Value Date   HGBA1C 7.1 (H) 12/17/2017         Assessment & Plan:   Problem List Items Addressed This Visit    DM (diabetes mellitus), type 2 with neurological complications ([place])    RTMY1R acceptable, minimize simple carbs. Increase exercise as tolerated. Continue current meds      Relevant Orders   Hemoglobin A1c   GOUT, UNSPECIFIED    No gout flares       Relevant Orders   Uric acid  ESSENTIAL HYPERTENSION, BENIGN    Well controlled, no changes to meds. Encouraged heart healthy diet such as the DASH diet and exercise as tolerated.       Relevant Orders   CBC   Comprehensive metabolic panel   TSH   Hyperlipidemia    Encouraged heart healthy diet, increase exercise, avoid trans fats, consider a krill  oil cap daily      Relevant Orders   Lipid panel      I am having Moe Brier. Haigler start on blood glucose meter kit and supplies. I am also having her maintain her B Complex Vitamins (VITAMIN-B COMPLEX PO), aspirin, OVER THE COUNTER MEDICATION, Probiotic Product (PROBIOTIC DAILY PO), latanoprost, hydrochlorothiazide, rosuvastatin, lisinopril, and glucose blood.  Meds ordered this encounter  Medications  . glucose blood (ACCU-CHEK AVIVA PLUS) test strip    Sig: Check blood sugars twice daily    Dispense:  100 each    Refill:  12    DX code:E11.9  . blood glucose meter kit and supplies    Sig: Dispense based on patient and insurance preference. Use up to rwo times daily as directed. DX: E11.9). Aviva Plus meter    Dispense:  1 each    Refill:  0    Order Specific Question:   Number of strips    Answer:   0    Order Specific Question:   Number of lancets    Answer:   100    CMA served as scribe during this visit. History, Physical and Plan performed by medical provider. Documentation and orders reviewed and attested to.  Penni Homans, MD

## 2017-12-24 NOTE — Assessment & Plan Note (Signed)
No gout flares.

## 2017-12-24 NOTE — Assessment & Plan Note (Signed)
Encouraged heart healthy diet, increase exercise, avoid trans fats, consider a krill oil cap daily 

## 2017-12-24 NOTE — Assessment & Plan Note (Signed)
Well controlled, no changes to meds. Encouraged heart healthy diet such as the DASH diet and exercise as tolerated.  °

## 2018-01-15 ENCOUNTER — Encounter: Payer: Self-pay | Admitting: Family Medicine

## 2018-01-21 ENCOUNTER — Other Ambulatory Visit: Payer: Self-pay | Admitting: Family Medicine

## 2018-01-21 ENCOUNTER — Telehealth: Payer: Self-pay

## 2018-01-21 MED ORDER — AMOXICILLIN 500 MG PO CAPS: 2000.0000 mg | ORAL_CAPSULE | Freq: Once | ORAL | 0 refills | Status: AC

## 2018-01-21 NOTE — Telephone Encounter (Signed)
Patient came in today to ask for antibiotic for dental work. And also stated she needs something for anxiety that you suggested. Patient came in for a 12/24/17 visit and discussed dental work with you. Patient will have dental work tomorrow  Please advise.   Please send to [facility] on [address].

## 2018-01-21 NOTE — Telephone Encounter (Signed)
I sent in Amoxicillin to take 1 hour prior to dnetal work. But I need to have you confirm if she wants a daily med for anxiety to help for the long term or does she just want a med to use as needed for very stressful days. We talked about both options.

## 2018-01-24 NOTE — Telephone Encounter (Signed)
Called patient unable to to leave message

## 2018-02-12 ENCOUNTER — Other Ambulatory Visit: Payer: Self-pay | Admitting: Family Medicine

## 2018-05-14 ENCOUNTER — Other Ambulatory Visit: Payer: Self-pay | Admitting: Family Medicine

## 2018-06-19 ENCOUNTER — Other Ambulatory Visit (INDEPENDENT_AMBULATORY_CARE_PROVIDER_SITE_OTHER): Payer: Medicare Other

## 2018-06-19 DIAGNOSIS — E785 Hyperlipidemia, unspecified: Secondary | ICD-10-CM | POA: Diagnosis not present

## 2018-06-19 DIAGNOSIS — I10 Essential (primary) hypertension: Secondary | ICD-10-CM | POA: Diagnosis not present

## 2018-06-19 DIAGNOSIS — E1149 Type 2 diabetes mellitus with other diabetic neurological complication: Secondary | ICD-10-CM

## 2018-06-19 DIAGNOSIS — M109 Gout, unspecified: Secondary | ICD-10-CM

## 2018-06-19 LAB — LIPID PANEL
CHOL/HDL RATIO: 3
Cholesterol: 161 mg/dL (ref 0–200)
HDL: 52.5 mg/dL (ref >39.00)
LDL CALC: 81 mg/dL (ref 0–99)
NonHDL: 108.05
Triglycerides: 137 mg/dL (ref 0.0–149.0)
VLDL: 27.4 mg/dL (ref 0.0–40.0)

## 2018-06-19 LAB — URIC ACID: Uric Acid, Serum: 7.9 mg/dL — ABNORMAL HIGH (ref 2.4–7.0)

## 2018-06-19 LAB — CBC
HEMATOCRIT: 42.9 % (ref 36.0–46.0)
Hemoglobin: 14.5 g/dL (ref 12.0–15.0)
MCHC: 33.9 g/dL (ref 30.0–36.0)
MCV: 87.6 fl (ref 78.0–100.0)
Platelets: 189 10*3/uL (ref 150.0–400.0)
RBC: 4.9 Mil/uL (ref 3.87–5.11)
RDW: 13.6 % (ref 11.5–15.5)
WBC: 5.2 10*3/uL (ref 4.0–10.5)

## 2018-06-19 LAB — COMPREHENSIVE METABOLIC PANEL
ALT: 14 U/L (ref 0–35)
AST: 19 U/L (ref 0–37)
Albumin: 4.3 g/dL (ref 3.5–5.2)
Alkaline Phosphatase: 57 U/L (ref 39–117)
BUN: 20 mg/dL (ref 6–23)
CALCIUM: 9.5 mg/dL (ref 8.4–10.5)
CHLORIDE: 97 meq/L (ref 96–112)
CO2: 28 meq/L (ref 19–32)
CREATININE: 0.75 mg/dL (ref 0.40–1.20)
GFR: 80.47 mL/min (ref >60.00)
Glucose, Bld: 135 mg/dL — ABNORMAL HIGH (ref 70–99)
Potassium: 3.8 mEq/L (ref 3.5–5.1)
SODIUM: 135 meq/L (ref 135–145)
Total Bilirubin: 0.6 mg/dL (ref 0.2–1.2)
Total Protein: 7 g/dL (ref 6.0–8.3)

## 2018-06-19 LAB — TSH: TSH: 3.17 u[IU]/mL (ref 0.35–4.50)

## 2018-06-19 LAB — HEMOGLOBIN A1C: HEMOGLOBIN A1C: 6.9 % — AB (ref 4.6–6.5)

## 2018-06-23 ENCOUNTER — Encounter: Payer: Self-pay | Admitting: Family Medicine

## 2018-06-24 ENCOUNTER — Encounter: Payer: Self-pay | Admitting: Family Medicine

## 2018-06-24 ENCOUNTER — Ambulatory Visit (INDEPENDENT_AMBULATORY_CARE_PROVIDER_SITE_OTHER): Payer: Medicare Other | Admitting: Family Medicine

## 2018-06-24 VITALS — BP 102/64 | HR 60 | Temp 98.2°F | Resp 18 | Ht 62.0 in | Wt 156.0 lb

## 2018-06-24 DIAGNOSIS — E2839 Other primary ovarian failure: Secondary | ICD-10-CM

## 2018-06-24 DIAGNOSIS — I10 Essential (primary) hypertension: Secondary | ICD-10-CM

## 2018-06-24 DIAGNOSIS — Z23 Encounter for immunization: Secondary | ICD-10-CM | POA: Diagnosis not present

## 2018-06-24 DIAGNOSIS — M109 Gout, unspecified: Secondary | ICD-10-CM | POA: Diagnosis not present

## 2018-06-24 DIAGNOSIS — E1149 Type 2 diabetes mellitus with other diabetic neurological complication: Secondary | ICD-10-CM | POA: Diagnosis not present

## 2018-06-24 DIAGNOSIS — I38 Endocarditis, valve unspecified: Secondary | ICD-10-CM

## 2018-06-24 DIAGNOSIS — E785 Hyperlipidemia, unspecified: Secondary | ICD-10-CM

## 2018-06-24 DIAGNOSIS — E663 Overweight: Secondary | ICD-10-CM

## 2018-06-24 NOTE — Assessment & Plan Note (Signed)
Has done well with diet

## 2018-06-24 NOTE — Assessment & Plan Note (Signed)
Well controlled, no changes to meds. Encouraged heart healthy diet such as the DASH diet and exercise as tolerated.  °

## 2018-06-24 NOTE — Patient Instructions (Signed)
Shingrix is the new shingles shot, 2 shots over 2-6 months at pharmacy   Hypertension Hypertension is another name for high blood pressure. High blood pressure forces your heart to work harder to pump blood. This can cause problems over time. There are two numbers in a blood pressure reading. There is a top number (systolic) over a bottom number (diastolic). It is best to have a blood pressure below 120/80. Healthy choices can help lower your blood pressure. You may need medicine to help lower your blood pressure if:  Your blood pressure cannot be lowered with healthy choices.  Your blood pressure is higher than 130/80.  Follow these instructions at home: Eating and drinking  If directed, follow the DASH eating plan. This diet includes: ? Filling half of your plate at each meal with fruits and vegetables. ? Filling one quarter of your plate at each meal with whole grains. Whole grains include whole wheat pasta, brown rice, and whole grain bread. ? Eating or drinking low-fat dairy products, such as skim milk or low-fat yogurt. ? Filling one quarter of your plate at each meal with low-fat (lean) proteins. Low-fat proteins include fish, skinless chicken, eggs, beans, and tofu. ? Avoiding fatty meat, cured and processed meat, or chicken with skin. ? Avoiding premade or processed food.  Eat less than 1,500 mg of salt (sodium) a day.  Limit alcohol use to no more than 1 drink a day for nonpregnant women and 2 drinks a day for men. One drink equals 12 oz of beer, 5 oz of wine, or 1 oz of hard liquor. Lifestyle  Work with your doctor to stay at a healthy weight or to lose weight. Ask your doctor what the best weight is for you.  Get at least 30 minutes of exercise that causes your heart to beat faster (aerobic exercise) most days of the week. This may include walking, swimming, or biking.  Get at least 30 minutes of exercise that strengthens your muscles (resistance exercise) at least 3 days  a week. This may include lifting weights or pilates.  Do not use any products that contain nicotine or tobacco. This includes cigarettes and e-cigarettes. If you need help quitting, ask your doctor.  Check your blood pressure at home as told by your doctor.  Keep all follow-up visits as told by your doctor. This is important. Medicines  Take over-the-counter and prescription medicines only as told by your doctor. Follow directions carefully.  Do not skip doses of blood pressure medicine. The medicine does not work as well if you skip doses. Skipping doses also puts you at risk for problems.  Ask your doctor about side effects or reactions to medicines that you should watch for. Contact a doctor if:  You think you are having a reaction to the medicine you are taking.  You have headaches that keep coming back (recurring).  You feel dizzy.  You have swelling in your ankles.  You have trouble with your vision. Get help right away if:  You get a very bad headache.  You start to feel confused.  You feel weak or numb.  You feel faint.  You get very bad pain in your: ? Chest. ? Belly (abdomen).  You throw up (vomit) more than once.  You have trouble breathing. Summary  Hypertension is another name for high blood pressure.  Making healthy choices can help lower blood pressure. If your blood pressure cannot be controlled with healthy choices, you may need to take medicine.   This information is not intended to replace advice given to you by your health care provider. Make sure you discuss any questions you have with your health care provider. Document Released: 01/09/2008 Document Revised: 06/20/2016 Document Reviewed: 06/20/2016 Elsevier Interactive Patient Education  2018 [employer]  

## 2018-06-24 NOTE — Assessment & Plan Note (Signed)
Asymptomatic will continue to monitor and hydrate better

## 2018-06-24 NOTE — Assessment & Plan Note (Signed)
Encouraged heart healthy diet, increase exercise, avoid trans fats, consider a krill oil cap daily 

## 2018-06-24 NOTE — Assessment & Plan Note (Addendum)
hgba1c acceptable, minimize simple carbs. Increase exercise as tolerated.  

## 2018-06-29 NOTE — Progress Notes (Signed)
Subjective:    Patient ID: Andrea Calderon, female    DOB: February 20, 1946, 72 y.o.   MRN: [id number]  Chief Complaint  Patient presents with  . Follow-up    HPI Patient is in today for annual preventative exam and follow up on chronic medical concerns including valvular heart disease, thyroid disease, gout and hyperlipidemia. No recent febrile illness or hospitalizations. She is doing well with activities of daily living. Is maintaining a heart healthy diet and stays active. Denies CP/palp/SOB/HA/congestion/fevers/GI or GU c/o. Taking meds as prescribed  Past Medical History:  Diagnosis Date  . BCC (basal cell carcinoma), face 12/05/2014   [facility] from right side [facility] Altamese Cabal and Dr Ferdinand Lango  . Cancer (HCC)    BCC right nose  . Diabetes mellitus   . H/O: stroke 09/11/2011   September 2012 acute right retinal artery occlusion H/o left retinal artery embolic event by imaging H/o blood clot in port during chemotherapy   . Multiple thyroid nodules 06/13/2012  . Oral lichen planus 01/10/8937  . Other and unspecified hyperlipidemia 12/16/2012  . Preventative health care 06/21/2013  . Rosacea   . Valvular heart disease 06/13/2012   Cardiologist Dr Darral Dash of [facility] in [facility]    Past Surgical History:  Procedure Laterality Date  . ABDOMINAL HYSTERECTOMY  2009   total  . COLECTOMY  2009  . MOHS SURGERY Right    for [facility] on right nose, Feb 2016  . TONGUE BIOPSY  01/05/15   negative results  . VENTRAL HERNIA REPAIR  11/11   4 lesions found, mesh left in place    Family History  Problem Relation Age of Onset  . Transient ischemic attack Mother   . Hypertension Mother   . Dementia Mother   . Diabetes Father        borderline sugar  . Coronary artery disease Father   . Heart disease Maternal Grandfather   . Heart disease Paternal Grandfather     Social History   Socioeconomic History  . Marital status: Married    Spouse name:  Not on file  . Number of children: Not on file  . Years of education: Not on file  . Highest education level: Not on file  Occupational History  . Not on file  Social Needs  . Financial resource strain: Not on file  . Food insecurity:    Worry: Not on file    Inability: Not on file  . Transportation needs:    Medical: Not on file    Non-medical: Not on file  Tobacco Use  . Smoking status: Never Smoker  . Smokeless tobacco: Never Used  Substance and Sexual Activity  . Alcohol use: Yes    Alcohol/week: 0.0 standard drinks    Comment: rare, social  . Drug use: No  . Sexual activity: Yes  Lifestyle  . Physical activity:    Days per week: Not on file    Minutes per session: Not on file  . Stress: Not on file  Relationships  . Social connections:    Talks on phone: Not on file    Gets together: Not on file    Attends religious service: Not on file    Active member of club or organization: Not on file    Attends meetings of clubs or organizations: Not on file    Relationship status: Not on file  . Intimate partner violence:    Fear  of current or ex partner: Not on file    Emotionally abused: Not on file    Physically abused: Not on file    Forced sexual activity: Not on file  Other Topics Concern  . Not on file  Social History Narrative  . Not on file    Outpatient Medications Prior to Visit  Medication Sig Dispense Refill  . aspirin 81 MG tablet Take 81 mg by mouth daily.    . B Complex Vitamins (VITAMIN-B COMPLEX PO) Take 1 tablet by mouth daily. Reported on 12/22/2015    . blood glucose meter kit and supplies Dispense based on patient and insurance preference. Use up to rwo times daily as directed. DX: E11.9). Aviva Plus meter 1 each 0  . glucose blood (ACCU-CHEK AVIVA PLUS) test strip Check blood sugars twice daily 100 each 12  . hydrochlorothiazide (HYDRODIURIL) 25 MG tablet TAKE 1 TABLET(25 MG) BY MOUTH DAILY 90 tablet 0  . latanoprost (XALATAN) 0.005 % ophthalmic  solution Place 1 drop into both eyes daily.  3  . lisinopril (PRINIVIL,ZESTRIL) 20 MG tablet TAKE 1 TABLET(20 MG) BY MOUTH DAILY 90 tablet 0  . OVER THE COUNTER MEDICATION Vita Fusion Fiber- 2 daily    . Probiotic Product (PROBIOTIC DAILY PO) Take 1 capsule by mouth daily.    . rosuvastatin (CRESTOR) 10 MG tablet TAKE 1 TABLET(10 MG) BY MOUTH DAILY 90 tablet 0  . lisinopril (PRINIVIL,ZESTRIL) 20 MG tablet TAKE 1 TABLET(20 MG) BY MOUTH DAILY (Patient not taking: Reported on 06/24/2018) 90 tablet 0   No facility-administered medications prior to visit.     Allergies  Allergen Reactions  . Codeine Nausea And Vomiting    Review of Systems  Constitutional: Negative for fever and malaise/fatigue.  HENT: Negative for congestion.   Eyes: Negative for blurred vision.  Respiratory: Negative for shortness of breath.   Cardiovascular: Negative for chest pain, palpitations and leg swelling.  Gastrointestinal: Negative for abdominal pain, blood in stool and nausea.  Genitourinary: Negative for dysuria and frequency.  Musculoskeletal: Negative for falls.  Skin: Negative for rash.  Neurological: Negative for dizziness, loss of consciousness and headaches.  Endo/Heme/Allergies: Negative for environmental allergies.  Psychiatric/Behavioral: Negative for depression. The patient is not nervous/anxious.        Objective:    Physical Exam  Constitutional: She is oriented to person, place, and time. No distress.  HENT:  Head: Normocephalic and atraumatic.  Right Ear: External ear normal.  Left Ear: External ear normal.  Nose: Nose normal.  Mouth/Throat: Oropharynx is clear and moist. No oropharyngeal exudate.  Eyes: Pupils are equal, round, and reactive to light. Conjunctivae are normal. Right eye exhibits no discharge. Left eye exhibits no discharge. No scleral icterus.  Neck: Normal range of motion. Neck supple. No thyromegaly present.  Cardiovascular: Normal rate, regular rhythm, normal heart  sounds and intact distal pulses.  No murmur heard. Pulmonary/Chest: Effort normal and breath sounds normal. No respiratory distress. She has no wheezes. She has no rales.  Abdominal: Soft. Bowel sounds are normal. She exhibits no distension and no mass. There is no tenderness.  Musculoskeletal: Normal range of motion. She exhibits no edema or tenderness.  Lymphadenopathy:    She has no cervical adenopathy.  Neurological: She is alert and oriented to person, place, and time. She has normal reflexes. She displays normal reflexes. No cranial nerve deficit. Coordination normal.  Skin: Skin is warm and dry. No rash noted. She is not diaphoretic.    BP 102/64 (  BP Location: Right Arm, Patient Position: Sitting, Cuff Size: Normal)   Pulse 60   Temp 98.2 F (36.8 C)   Resp 18   Ht 5' 2"  (1.575 m)   Wt 156 lb (70.8 kg)   SpO2 96%   BMI 28.53 kg/m  Wt Readings from Last 3 Encounters:  06/24/18 156 lb (70.8 kg)  12/24/17 153 lb 9.6 oz (69.7 kg)  05/23/17 156 lb 6.4 oz (70.9 kg)     Lab Results  Component Value Date   WBC 5.2 06/19/2018   HGB 14.5 06/19/2018   HCT 42.9 06/19/2018   PLT 189.0 06/19/2018   GLUCOSE 135 (H) 06/19/2018   CHOL 161 06/19/2018   TRIG 137.0 06/19/2018   HDL 52.50 06/19/2018   LDLDIRECT 182.4 09/11/2011   LDLCALC 81 06/19/2018   ALT 14 06/19/2018   AST 19 06/19/2018   NA 135 06/19/2018   K 3.8 06/19/2018   CL 97 06/19/2018   CREATININE 0.75 06/19/2018   BUN 20 06/19/2018   CO2 28 06/19/2018   TSH 3.17 06/19/2018   INR 0.9 09/11/2011   HGBA1C 6.9 (H) 06/19/2018    Lab Results  Component Value Date   TSH 3.17 06/19/2018   Lab Results  Component Value Date   WBC 5.2 06/19/2018   HGB 14.5 06/19/2018   HCT 42.9 06/19/2018   MCV 87.6 06/19/2018   PLT 189.0 06/19/2018   Lab Results  Component Value Date   NA 135 06/19/2018   K 3.8 06/19/2018   CO2 28 06/19/2018   GLUCOSE 135 (H) 06/19/2018   BUN 20 06/19/2018   CREATININE 0.75 06/19/2018    BILITOT 0.6 06/19/2018   ALKPHOS 57 06/19/2018   AST 19 06/19/2018   ALT 14 06/19/2018   PROT 7.0 06/19/2018   ALBUMIN 4.3 06/19/2018   CALCIUM 9.5 06/19/2018   GFR 80.47 06/19/2018   Lab Results  Component Value Date   CHOL 161 06/19/2018   Lab Results  Component Value Date   HDL 52.50 06/19/2018   Lab Results  Component Value Date   LDLCALC 81 06/19/2018   Lab Results  Component Value Date   TRIG 137.0 06/19/2018   Lab Results  Component Value Date   CHOLHDL 3 06/19/2018   Lab Results  Component Value Date   HGBA1C 6.9 (H) 06/19/2018       Assessment & Plan:   Problem List Items Addressed This Visit    DM (diabetes mellitus), type 2 with neurological complications ([place])    IWPY0D acceptable, minimize simple carbs. Increase exercise as tolerated.       Relevant Orders   Hemoglobin A1c   GOUT, UNSPECIFIED    Asymptomatic will continue to monitor and hydrate better      Relevant Orders   Uric acid   Overweight    Has done well with diet       ESSENTIAL HYPERTENSION, BENIGN    Well controlled, no changes to meds. Encouraged heart healthy diet such as the DASH diet and exercise as tolerated.       Relevant Orders   CBC   TSH   Comprehensive metabolic panel   Valvular heart disease    Asymptomatic and following with cardiology. No change in treatment today      Hyperlipidemia    Encouraged heart healthy diet, increase exercise, avoid trans fats, consider a krill oil cap daily      Relevant Orders   Lipid panel    Other Visit Diagnoses  Estrogen deficiency    -  Primary   Relevant Orders   DG Bone Density      I am having Javonda Suh. Whedbee maintain her B Complex Vitamins (VITAMIN-B COMPLEX PO), aspirin, OVER THE COUNTER MEDICATION, Probiotic Product (PROBIOTIC DAILY PO), latanoprost, glucose blood, blood glucose meter kit and supplies, hydrochlorothiazide, lisinopril, and rosuvastatin.  No orders of the defined types were placed in this  encounter.    Penni Homans, MD

## 2018-06-29 NOTE — Assessment & Plan Note (Signed)
Asymptomatic and following with cardiology. No change in treatment today

## 2018-07-01 ENCOUNTER — Ambulatory Visit (HOSPITAL_BASED_OUTPATIENT_CLINIC_OR_DEPARTMENT_OTHER)
Admission: RE | Admit: 2018-07-01 | Discharge: 2018-07-01 | Disposition: A | Discharge status: Home / Self Care | Payer: Medicare Other | Source: Ambulatory Visit | Attending: Family Medicine | Admitting: Family Medicine

## 2018-07-01 DIAGNOSIS — E2839 Other primary ovarian failure: Secondary | ICD-10-CM | POA: Diagnosis present

## 2018-07-15 ENCOUNTER — Other Ambulatory Visit (HOSPITAL_BASED_OUTPATIENT_CLINIC_OR_DEPARTMENT_OTHER): Payer: Self-pay

## 2018-07-23 LAB — HM DIABETES EYE EXAM

## 2018-08-05 ENCOUNTER — Encounter: Payer: Self-pay | Admitting: Family Medicine

## 2018-08-15 NOTE — Progress Notes (Addendum)
Subjective:   Andrea Calderon is a 73 y.o. female who presents for Medicare Annual (Subsequent) preventive examination.  Review of Systems: No ROS.  Medicare Wellness Visit. Additional risk factors are reflected in the social history. Cardiac Risk Factors include: advanced age (>32mn, >>23women);diabetes mellitus;dyslipidemia;hypertension Sleep patterns: sleeps 7-8 hrs.  Home Safety/Smoke Alarms: Feels safe in home. Smoke alarms in place.  [facility]   Female:     Mammo- pt reports last done 07/06/18. Normal      Dexa scan-  07/01/18      [facility]. 5 yr [facility]  [place] Last DM eye exam  07/06/18     Objective:     Vitals: BP 122/78 (BP Location: Left Arm, Patient Position: Sitting, Cuff Size: Normal)   Pulse 60   Ht _0  (1.575 m)   Wt 156 lb 6.4 oz (70.9 kg)   SpO2 97%   BMI 28.61 kg/m   Body mass index is 28.61 kg/m.  Advanced Directives 08/18/2018 11/20/2016 05/31/2015 05/21/2014  Does Patient Have a Medical Advance Directive? Yes No Yes Yes  Type of [place]aramed[place]of [place] will  Does patient want to make changes to medical advance directive? No - Patient declined - No - Patient declined No - Patient declined  Copy of HMount [place]ain Chart? No - copy requested - Yes No - copy requested    Tobacco Social History   Tobacco Use  Smoking Status Never Smoker  Smokeless Tobacco Never Used     Counseling given: Not Answered   Clinical Intake: Pain : No/denies pain    Past Medical History:  Diagnosis Date  . BCC (basal cell carcinoma), face 12/05/2014   [facility]oved from right side [facility] SAltamese Cabaland Dr PFerdinand Lango . Cancer (HCC)    BCC right nose  . Diabetes mellitus   . H/O: stroke 09/11/2011    September 2012 acute right retinal artery occlusion H/o left retinal artery embolic event by imaging H/o blood clot in port during chemotherapy   . Multiple thyroid nodules 06/13/2012  . Oral lichen planus 26/10/8464 . Other and unspecified hyperlipidemia 12/16/2012  . Preventative health care 06/21/2013  . Rosacea   . Valvular heart disease 06/13/2012   [facility]  Past Surgical History:  Procedure Laterality Date  . ABDOMINAL HYSTERECTOMY  2009   total  . COLECTOMY  2009  . [facility]  . TONGUE BIOPSY  01/05/15   negative results  . VENTRAL HERNIA REPAIR  11/11   4 lesions found, mesh left in place   Family History  Problem Relation Age of Onset  . Transient ischemic attack Mother   . Hypertension Mother   . Dementia Mother   . Diabetes Father        borderline sugar  . Coronary artery disease Father   . Heart disease Maternal Grandfather   . Heart disease Paternal Grandfather    Social History   Socioeconomic History  . Marital status: Married    Spouse name: Not on file  . Number of children: Not on file  . Years of education: Not on file  . Highest education  level: Not on file  Occupational History  . Not on file  Social Needs  . Financial resource strain: Not on file  . Food insecurity:    Worry: Not on file    Inability: Not on file  . Transportation needs:    Medical: Not on file    Non-medical: Not on file  Tobacco Use  . Smoking status: Never Smoker  . Smokeless tobacco: Never Used  Substance and Sexual Activity  . Alcohol use: Yes    Alcohol/week: 0.0 standard drinks    Comment: rare, social  . Drug use: No  . Sexual activity: Yes  Lifestyle  . Physical activity:    Days per week: Not on file    Minutes per session: Not on file  . Stress: Not on file  Relationships  . Social connections:    Talks on phone: Not on file    Gets together: Not on file     Attends religious service: Not on file    Active member of club or organization: Not on file    Attends meetings of clubs or organizations: Not on file    Relationship status: Not on file  Other Topics Concern  . Not on file  Social History Narrative  . Not on file    Outpatient Encounter Medications as of 08/18/2018  Medication Sig  . aspirin 81 MG tablet Take 81 mg by mouth daily.  . B Complex Vitamins (VITAMIN-B COMPLEX PO) Take 1 tablet by mouth daily. Reported on 12/22/2015  . blood glucose meter kit and supplies Dispense based on patient and insurance preference. Use up to rwo times daily as directed. DX: E11.9). Aviva Plus meter  . glucose blood (ACCU-CHEK AVIVA PLUS) test strip Check blood sugars twice daily  . hydrochlorothiazide (HYDRODIURIL) 25 MG tablet TAKE 1 TABLET(25 MG) BY MOUTH DAILY  . latanoprost (XALATAN) 0.005 % ophthalmic solution Place 1 drop into both eyes daily.  Marland Kitchen lisinopril (PRINIVIL,ZESTRIL) 20 MG tablet TAKE 1 TABLET(20 MG) BY MOUTH DAILY  . OVER THE COUNTER MEDICATION Vita Fusion Fiber- 2 daily  . rosuvastatin (CRESTOR) 10 MG tablet TAKE 1 TABLET(10 MG) BY MOUTH DAILY  . [DISCONTINUED] Probiotic Product (PROBIOTIC DAILY PO) Take 1 capsule by mouth daily.   No facility-administered encounter medications on file as of 08/18/2018.     Activities of Daily Living In your present state of health, do you have any difficulty performing the following activities: 08/18/2018  Hearing? N  Vision? N  Comment has bifocals  Difficulty concentrating or making decisions? N  Walking or climbing stairs? N  Dressing or bathing? N  Doing errands, shopping? N  Preparing Food and eating ? N  Using the Toilet? N  In the past six months, have you accidently leaked urine? N  Do you have problems with loss of bowel control? N  Managing your Medications? N  Managing your Finances? N  Housekeeping or managing your Housekeeping? N  Some recent data might be hidden    Patient  Care Team: [name], MD as PCP - General (Family Medicine) Maralyn Sago, MD as Referring Physician (Gynecologic Oncology) Bloomfeld, Sharen Hint, MD as Referring Physician (Gastroenterology) Kathlen Brunswick., MD as Referring Physician (Cardiology) Leonia Corona, MD as Referring Physician (Ophthalmology) Murvin Donning, MD as Consulting Physician (Dermatology)    Assessment:   This is a routine wellness examination for R[place]. Physical assessment deferred to PCP.  Exercise Activities and Dietary recommendations Current Exercise Habits: Home exercise routine;Structured  exercise class, Type of exercise: yoga;walking, Time (Minutes): 60, Frequency (Times/Week): 6, Weekly Exercise (Minutes/Week): 360, Intensity: Mild, Exercise limited by: None identified Diet (meal preparation, eat out, water intake, caffeinated beverages, dairy products, fruits and vegetables): in general, a "healthy" diet  , well balanced   Goals    . maintain healthy lifestyle and weight       Fall Risk Fall Risk  08/18/2018 11/20/2016 05/31/2015 05/21/2014 06/21/2013  Falls in the past year? 0 No No No No     Depression Screen PHQ 2/9 Scores 08/18/2018 11/20/2016 05/31/2015 05/21/2014  PHQ - 2 Score 0 0 0 0     Cognitive Function Ad8 score reviewed for issues:  Issues making decisions:no  Less interest in hobbies / activities:no  Repeats questions, stories (family complaining):no  Trouble using ordinary gadgets (microwave, computer, phone):no  Forgets the month or year: no  Mismanaging finances: no  Remembering appts:no  Daily problems with thinking and/or memory:no Ad8 score is=0        Immunization History  Administered Date(s) Administered  . Influenza Split 06/13/2012  . Influenza Whole 04/06/2010, 05/31/2011  . Influenza, High Dose Seasonal PF 05/29/2016, 05/23/2017, 06/24/2018  . Influenza,inj,Quad PF,6+ Mos 05/21/2014, 05/31/2015  . Influenza-Unspecified 06/06/2013    . PPD Test 10/09/2016  . Pneumococcal Conjugate-13 12/21/2013  . Pneumococcal Polysaccharide-23 04/06/2010, 05/31/2015  . Tdap 09/11/2011  . Zoster 06/06/2013     Screening Tests Health Maintenance  Topic Date Due  . FOOT EXAM  12/21/2016  . MAMMOGRAM  03/06/2017  . HEMOGLOBIN A1C  12/18/2018  . OPHTHALMOLOGY EXAM  07/24/2019  . TETANUS/TDAP  09/10/2021  . COLONOSCOPY  08/06/2022  . INFLUENZA VACCINE  Completed  . DEXA SCAN  Completed  . Hepatitis C Screening  Completed  . PNA vac Low Risk Adult  Completed        Plan:  Please schedule your next medicare wellness visit with me in 1 yr.  Continue to eat heart healthy diet! Great job with [employer]!  Continue exercising  Continue doing brain stimulating activities (puzzles, reading, adult coloring books, staying active) to keep memory sharp.   Bring a copy of your living will and/or healthcare power of attorney to your next office visit.    I have personally reviewed and noted the following in the patient's chart:   . Medical and social history . Use of alcohol, tobacco or illicit drugs  . Current medications and supplements . Functional ability and status . Nutritional status . Physical activity . Advanced directives . List of other physicians . Hospitalizations, surgeries, and ER visits in previous 12 months . Vitals . Screenings to include cognitive, depression, and falls . Referrals and appointments  In addition, I have reviewed and discussed with patient certain preventive protocols, quality metrics, and best practice recommendations. A written personalized care plan for preventive services as well as general preventive health recommendations were provided to patient.     Shela Nevin, [place]  08/18/2018    Medical screening examination/treatment was performed by qualified clinical staff member and as supervising physician I was immediately available for consultation/collaboration. I have  reviewed documentation and agree with assessment and plan.  Penni Homans, MD

## 2018-08-18 ENCOUNTER — Ambulatory Visit (INDEPENDENT_AMBULATORY_CARE_PROVIDER_SITE_OTHER): Payer: Medicare Other | Admitting: *Deleted

## 2018-08-18 ENCOUNTER — Encounter: Payer: Self-pay | Admitting: *Deleted

## 2018-08-18 VITALS — BP 122/78 | HR 60 | Ht 62.0 in | Wt 156.4 lb

## 2018-08-18 DIAGNOSIS — Z0000 Encounter for general adult medical examination without abnormal findings: Secondary | ICD-10-CM

## 2018-08-18 NOTE — Patient Instructions (Signed)
Please schedule your next medicare wellness visit with me in 1 yr.  Continue to eat heart healthy diet! Great job with [employer]!  Continue exercising  Continue doing brain stimulating activities (puzzles, reading, adult coloring books, staying active) to keep memory sharp.   Bring a copy of your living will and/or healthcare power of attorney to your next office visit.   Andrea Calderon , Thank you for taking time to come for your Medicare Wellness Visit. I appreciate your ongoing commitment to your health goals. Please review the following plan we discussed and let me know if I can assist you in the future.   These are the goals we discussed: Goals    . maintain healthy lifestyle and weight       This is a list of the screening recommended for you and due dates:  Health Maintenance  Topic Date Due  . Complete foot exam   12/21/2016  . Mammogram  03/06/2017  . Hemoglobin A1C  12/18/2018  . Eye exam for diabetics  07/24/2019  . Tetanus Vaccine  09/10/2021  . Colon Cancer Screening  08/06/2022  . Flu Shot  Completed  . DEXA scan (bone density measurement)  Completed  .  Hepatitis C: One time screening is recommended by Center for Disease Control  (CDC) for  adults born from [birth year] through 1966.   Completed  . Pneumonia vaccines  Completed    Health Maintenance After Age 43 After age 59, you are at a higher risk for certain long-term diseases and infections as well as injuries from falls. Falls are a major cause of broken bones and head injuries in people who are older than age 69. Getting regular preventive care can help to keep you healthy and well. Preventive care includes getting regular testing and making lifestyle changes as recommended by your health care provider. Talk with your health care provider about:  Which screenings and tests you should have. A screening is a test that checks for a disease when you have no symptoms.  A diet and exercise plan that is right for  you. What should I know about screenings and tests to prevent falls? Screening and testing are the best ways to find a health problem early. Early diagnosis and treatment give you the best chance of managing medical conditions that are common after age 54. Certain conditions and lifestyle choices may make you more likely to have a fall. Your health care provider may recommend:  Regular vision checks. Poor vision and conditions such as cataracts can make you more likely to have a fall. If you wear glasses, make sure to get your prescription updated if your vision changes.  Medicine review. Work with your health care provider to regularly review all of the medicines you are taking, including over-the-counter medicines. Ask your health care provider about any side effects that may make you more likely to have a fall. Tell your health care provider if any medicines that you take make you feel dizzy or sleepy.  Osteoporosis screening. Osteoporosis is a condition that causes the bones to get weaker. This can make the bones weak and cause them to break more easily.  Blood pressure screening. Blood pressure changes and medicines to control blood pressure can make you feel dizzy.  Strength and balance checks. Your health care provider may recommend certain tests to check your strength and balance while standing, walking, or changing positions.  Foot health exam. Foot pain and numbness, as well as not wearing proper  footwear, can make you more likely to have a fall.  Depression screening. You may be more likely to have a fall if you have a fear of falling, feel emotionally low, or feel unable to do activities that you used to do.  Alcohol use screening. Using too much alcohol can affect your balance and may make you more likely to have a fall. What actions can I take to lower my risk of falls? General instructions  Talk with your health care provider about your risks for falling. Tell your health care  provider if: ? You fall. Be sure to tell your health care provider about all falls, even ones that seem minor. ? You feel dizzy, sleepy, or off-balance.  Take over-the-counter and prescription medicines only as told by your health care provider. These include any supplements.  Eat a healthy diet and maintain a healthy weight. A healthy diet includes low-fat dairy products, low-fat (lean) meats, and fiber from whole grains, beans, and lots of fruits and vegetables. Home safety  Remove any tripping hazards, such as rugs, cords, and clutter.  Install safety equipment such as grab bars in bathrooms and safety rails on stairs.  Keep rooms and walkways well-lit. Activity   Follow a regular exercise program to stay fit. This will help you maintain your balance. Ask your health care provider what types of exercise are appropriate for you.  If you need a cane or walker, use it as recommended by your health care provider.  Wear supportive shoes that have nonskid soles. Lifestyle  Do not drink alcohol if your health care provider tells you not to drink.  If you drink alcohol, limit how much you have: ? 0-1 drink a day for women. ? 0-2 drinks a day for men.  Be aware of how much alcohol is in your drink. In the [place] one drink equals one typical bottle of beer (12 oz), one-half glass of wine (5 oz), or one shot of hard liquor (1 oz).  Do not use any products that contain nicotine or tobacco, such as cigarettes and e-cigarettes. If you need help quitting, ask your health care provider. Summary  Having a healthy lifestyle and getting preventive care can help to protect your health and wellness after age 55.  Screening and testing are the best way to find a health problem early and help you avoid having a fall. Early diagnosis and treatment give you the best chance for managing medical conditions that are more common for people who are older than age 47.  Falls are a major cause of broken  bones and head injuries in people who are older than age 71. Take precautions to prevent a fall at home.  Work with your health care provider to learn what changes you can make to improve your health and wellness and to prevent falls. This information is not intended to replace advice given to you by your health care provider. Make sure you discuss any questions you have with your health care provider. Document Released: 06/05/2017 Document Revised: 06/05/2017 Document Reviewed: 06/05/2017 Elsevier Interactive Patient Education  2019 [employer].

## 2018-09-15 ENCOUNTER — Other Ambulatory Visit: Payer: Self-pay | Admitting: Family Medicine

## 2018-09-18 ENCOUNTER — Encounter: Payer: Self-pay | Admitting: Family Medicine

## 2018-09-19 NOTE — Telephone Encounter (Signed)
Please advise 

## 2018-09-20 ENCOUNTER — Other Ambulatory Visit: Payer: Self-pay | Admitting: Family Medicine

## 2018-09-20 MED ORDER — AMOXICILLIN 500 MG PO CAPS: 2000.0000 mg | ORAL_CAPSULE | Freq: Once | ORAL | 0 refills | Status: AC

## 2018-09-20 NOTE — Progress Notes (Unsigned)
amoxi

## 2018-11-21 ENCOUNTER — Telehealth: Payer: Self-pay | Admitting: Family Medicine

## 2018-11-21 NOTE — Telephone Encounter (Signed)
Pt Wanted to cancel labs and rescdh for October 8th

## 2018-12-23 ENCOUNTER — Ambulatory Visit: Payer: Self-pay | Admitting: Family Medicine

## 2019-01-18 ENCOUNTER — Other Ambulatory Visit: Payer: Self-pay | Admitting: Family Medicine

## 2019-05-06 ENCOUNTER — Other Ambulatory Visit: Payer: Self-pay | Admitting: Family Medicine

## 2019-05-06 ENCOUNTER — Encounter: Payer: Self-pay | Admitting: Family Medicine

## 2019-05-06 MED ORDER — AMOXICILLIN 500 MG PO CAPS: 500.0000 mg | ORAL_CAPSULE | Freq: Three times a day (TID) | ORAL | 0 refills | Status: DC

## 2019-05-14 ENCOUNTER — Other Ambulatory Visit (INDEPENDENT_AMBULATORY_CARE_PROVIDER_SITE_OTHER): Payer: Medicare Other

## 2019-05-14 ENCOUNTER — Other Ambulatory Visit: Payer: Self-pay

## 2019-05-14 DIAGNOSIS — E1149 Type 2 diabetes mellitus with other diabetic neurological complication: Secondary | ICD-10-CM

## 2019-05-14 DIAGNOSIS — I10 Essential (primary) hypertension: Secondary | ICD-10-CM

## 2019-05-14 DIAGNOSIS — M109 Gout, unspecified: Secondary | ICD-10-CM | POA: Diagnosis not present

## 2019-05-14 DIAGNOSIS — E785 Hyperlipidemia, unspecified: Secondary | ICD-10-CM | POA: Diagnosis not present

## 2019-05-14 LAB — LIPID PANEL
Cholesterol: 140 mg/dL (ref 0–200)
HDL: 54.5 mg/dL (ref >39.00)
LDL Cholesterol: 67 mg/dL (ref 0–99)
NonHDL: 85.78
Total CHOL/HDL Ratio: 3
Triglycerides: 95 mg/dL (ref 0.0–149.0)
VLDL: 19 mg/dL (ref 0.0–40.0)

## 2019-05-14 LAB — CBC
HCT: 43.9 % (ref 36.0–46.0)
Hemoglobin: 14.6 g/dL (ref 12.0–15.0)
MCHC: 33.2 g/dL (ref 30.0–36.0)
MCV: 89.4 fl (ref 78.0–100.0)
Platelets: 220 10*3/uL (ref 150.0–400.0)
RBC: 4.9 Mil/uL (ref 3.87–5.11)
RDW: 13.8 % (ref 11.5–15.5)
WBC: 5.9 10*3/uL (ref 4.0–10.5)

## 2019-05-14 LAB — TSH: TSH: 2.42 u[IU]/mL (ref 0.35–4.50)

## 2019-05-14 LAB — COMPREHENSIVE METABOLIC PANEL
ALT: 15 U/L (ref 0–35)
AST: 20 U/L (ref 0–37)
Albumin: 4.3 g/dL (ref 3.5–5.2)
Alkaline Phosphatase: 64 U/L (ref 39–117)
BUN: 15 mg/dL (ref 6–23)
CO2: 30 mEq/L (ref 19–32)
Calcium: 9.6 mg/dL (ref 8.4–10.5)
Chloride: 98 mEq/L (ref 96–112)
Creatinine, Ser: 0.74 mg/dL (ref 0.40–1.20)
GFR: 76.7 mL/min (ref >60.00)
Glucose, Bld: 140 mg/dL — ABNORMAL HIGH (ref 70–99)
Potassium: 3.8 mEq/L (ref 3.5–5.1)
Sodium: 136 mEq/L (ref 135–145)
Total Bilirubin: 0.5 mg/dL (ref 0.2–1.2)
Total Protein: 7 g/dL (ref 6.0–8.3)

## 2019-05-14 LAB — HEMOGLOBIN A1C: Hgb A1c MFr Bld: 7.3 % — ABNORMAL HIGH (ref 4.6–6.5)

## 2019-05-14 LAB — URIC ACID: Uric Acid, Serum: 6.9 mg/dL (ref 2.4–7.0)

## 2019-05-19 ENCOUNTER — Ambulatory Visit (INDEPENDENT_AMBULATORY_CARE_PROVIDER_SITE_OTHER): Payer: Medicare Other | Admitting: Family Medicine

## 2019-05-19 ENCOUNTER — Other Ambulatory Visit: Payer: Self-pay

## 2019-05-19 ENCOUNTER — Encounter: Payer: Self-pay | Admitting: Family Medicine

## 2019-05-19 VITALS — BP 118/58 | HR 62 | Temp 96.6°F | Resp 18 | Wt 156.4 lb

## 2019-05-19 DIAGNOSIS — I10 Essential (primary) hypertension: Secondary | ICD-10-CM

## 2019-05-19 DIAGNOSIS — E1149 Type 2 diabetes mellitus with other diabetic neurological complication: Secondary | ICD-10-CM | POA: Diagnosis not present

## 2019-05-19 DIAGNOSIS — I38 Endocarditis, valve unspecified: Secondary | ICD-10-CM

## 2019-05-19 DIAGNOSIS — C44310 Basal cell carcinoma of skin of unspecified parts of face: Secondary | ICD-10-CM

## 2019-05-19 DIAGNOSIS — Z23 Encounter for immunization: Secondary | ICD-10-CM | POA: Diagnosis not present

## 2019-05-19 DIAGNOSIS — R059 Cough, unspecified: Secondary | ICD-10-CM

## 2019-05-19 DIAGNOSIS — M109 Gout, unspecified: Secondary | ICD-10-CM | POA: Diagnosis not present

## 2019-05-19 DIAGNOSIS — R05 Cough: Secondary | ICD-10-CM

## 2019-05-19 DIAGNOSIS — E785 Hyperlipidemia, unspecified: Secondary | ICD-10-CM

## 2019-05-20 DIAGNOSIS — R059 Cough, unspecified: Secondary | ICD-10-CM | POA: Insufficient documentation

## 2019-05-20 DIAGNOSIS — R05 Cough: Secondary | ICD-10-CM | POA: Insufficient documentation

## 2019-05-20 NOTE — Assessment & Plan Note (Signed)
hgba1c acceptable, minimize simple carbs. Increase exercise as tolerated. Continue current meds. A1C is now above 7 discussed medication vs more aggressive lifestyle changes. She chooses to proceed with more intensive lifestyle modifications and recheck labs in 3 months

## 2019-05-20 NOTE — Assessment & Plan Note (Signed)
Well controlled, no changes to meds. Encouraged heart healthy diet such as the DASH diet and exercise as tolerated.  °

## 2019-05-20 NOTE — Progress Notes (Signed)
Subjective:    Patient ID: Andrea Calderon, female    DOB: 1946/07/14, 74 y.o.   MRN: [id number]  No chief complaint on file.   HPI Patient is in today for follow up on chronic medical concerns medical concerns including diabetes, hyperlipidemia, gout and more. No recent febrile illness or hospitalizations. No polyuria or polydipsia. Has Mohs surgery on her scalp scheduled for December 2 to remove a skin cancer. She is also noting she was struggling with a bad cough so she started drinking 1 tbls of apple cider vinegar, 1 tbls of honey and 4-6 oz of warm water daily and her symptoms are 95% better. No reflux or sore throat noted. Also is following closely with cardiology due to her worsening aortic valvular disease and they are close to recommending a valve replacement. She notes an uptick in SOB with exertion but is unclear if it is valve related or deconditioning related due to the pandemic. Denies CP/palp/HA/congestion/fevers/GI or GU c/o. Taking meds as prescribed  Past Medical History:  Diagnosis Date  . BCC (basal cell carcinoma), face 12/05/2014   Removed from right side of nose [facility] Altamese Cabal and Dr Ferdinand Lango  . Cancer (HCC)    BCC right nose  . Diabetes mellitus   . H/O: stroke 09/11/2011   September 2012 acute right retinal artery occlusion H/o left retinal artery embolic event by imaging H/o blood clot in port during chemotherapy   . Multiple thyroid nodules 06/13/2012  . 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 Other and unspecified hyperlipidemia 12/16/2012  . Preventative health care 06/21/2013  . Rosacea   . Valvular heart disease 06/13/2012   Cardiologist Dr Darral Dash of [facility] in [facility]    Past Surgical History:  Procedure Laterality Date  . ABDOMINAL HYSTERECTOMY  2009   total  . COLECTOMY  2009  . MOHS SURGERY Right    for [facility] on right nose, Feb 2016  . TONGUE BIOPSY  01/05/15   negative results  . VENTRAL HERNIA REPAIR  11/11   4  lesions found, mesh left in place    Family History  Problem Relation Age of Onset  . Transient ischemic attack Mother   . Hypertension Mother   . Dementia Mother   . Diabetes Father        borderline sugar  . Coronary artery disease Father   . Heart disease Maternal Grandfather   . Heart disease Paternal Grandfather     Social History   Socioeconomic History  . Marital status: Married    Spouse name: Not on file  . Number of children: Not on file  . Years of education: Not on file  . Highest education level: Not on file  Occupational History  . Not on file  Social Needs  . Financial resource strain: Not on file  . Food insecurity    Worry: Not on file    Inability: Not on file  . Transportation needs    Medical: Not on file    Non-medical: Not on file  Tobacco Use  . Smoking status: Never Smoker  . Smokeless tobacco: Never Used  Substance and Sexual Activity  . Alcohol use: Yes    Alcohol/week: 0.0 standard drinks    Comment: rare, social  . Drug use: No  . Sexual activity: Yes  Lifestyle  . Physical activity    Days per week: Not on file    Minutes per session: Not on  file  . Stress: Not on file  Relationships  . Social Herbalist on phone: Not on file    Gets together: Not on file    Attends religious service: Not on file    Active member of club or organization: Not on file    Attends meetings of clubs or organizations: Not on file    Relationship status: Not on file  . Intimate partner violence    Fear of current or ex partner: Not on file    Emotionally abused: Not on file    Physically abused: Not on file    Forced sexual activity: Not on file  Other Topics Concern  . Not on file  Social History Narrative  . Not on file    Outpatient Medications Prior to Visit  Medication Sig Dispense Refill  . aspirin 81 MG tablet Take 81 mg by mouth daily.    . B Complex Vitamins (VITAMIN-B COMPLEX PO) Take 1 tablet by mouth daily. Reported on  12/22/2015    . blood glucose meter kit and supplies Dispense based on patient and insurance preference. Use up to rwo times daily as directed. DX: E11.9). Aviva Plus meter 1 each 0  . glucose blood (ACCU-CHEK AVIVA PLUS) test strip Check blood sugars twice daily 100 each 12  . hydrochlorothiazide (HYDRODIURIL) 25 MG tablet TAKE 1 TABLET EVERY DAY 90 tablet 1  . latanoprost (XALATAN) 0.005 % ophthalmic solution Place 1 drop into both eyes daily.  3  . lisinopril (ZESTRIL) 20 MG tablet TAKE 1 TABLET EVERY DAY 90 tablet 1  . OVER THE COUNTER MEDICATION Vita Fusion Fiber- 2 daily    . rosuvastatin (CRESTOR) 10 MG tablet TAKE 1 TABLET EVERY DAY 90 tablet 1  . amoxicillin (AMOXIL) 500 MG capsule Take 1 capsule (500 mg total) by mouth 3 (three) times daily. 21 capsule 0   No facility-administered medications prior to visit.     Allergies  Allergen Reactions  . Codeine Nausea And Vomiting    Review of Systems  Constitutional: Negative for fever and malaise/fatigue.  HENT: Negative for congestion.   Eyes: Negative for blurred vision, pain and redness.  Respiratory: Negative for shortness of breath.   Cardiovascular: Negative for chest pain, palpitations and leg swelling.  Gastrointestinal: Negative for abdominal pain, blood in stool and nausea.  Genitourinary: Negative for dysuria and frequency.  Musculoskeletal: Negative for falls.  Skin: Negative for rash.  Neurological: Negative for dizziness, loss of consciousness and headaches.  Endo/Heme/Allergies: Negative for environmental allergies.  Psychiatric/Behavioral: Negative for depression. The patient is not nervous/anxious.        Objective:    Physical Exam Vitals signs and nursing note reviewed.  Constitutional:      General: She is not in acute distress.    Appearance: She is well-developed.  HENT:     Head: Normocephalic and atraumatic.     Nose: Nose normal.  Eyes:     General:        Right eye: No discharge.        Left  eye: No discharge.  Neck:     Musculoskeletal: Normal range of motion and neck supple.  Cardiovascular:     Rate and Rhythm: Normal rate and regular rhythm.     Heart sounds: No murmur.  Pulmonary:     Effort: Pulmonary effort is normal.     Breath sounds: Normal breath sounds.  Abdominal:     General: Bowel sounds are normal.  Palpations: Abdomen is soft.     Tenderness: There is no abdominal tenderness.  Skin:    General: Skin is warm and dry.  Neurological:     Mental Status: She is alert and oriented to person, place, and time.     BP (!) 118/58 (BP Location: Left Arm, Patient Position: Sitting, Cuff Size: Normal)   Pulse 62   Temp (!) 96.6 F (35.9 C) (Temporal)   Resp 18   Wt 156 lb 6.4 oz (70.9 kg)   SpO2 98%   BMI 28.61 kg/m  Wt Readings from Last 3 Encounters:  05/19/19 156 lb 6.4 oz (70.9 kg)  08/18/18 156 lb 6.4 oz (70.9 kg)  06/24/18 156 lb (70.8 kg)    Diabetic Foot Exam - Simple   No data filed     Lab Results  Component Value Date   WBC 5.9 05/14/2019   HGB 14.6 05/14/2019   HCT 43.9 05/14/2019   PLT 220.0 05/14/2019   GLUCOSE 140 (H) 05/14/2019   CHOL 140 05/14/2019   TRIG 95.0 05/14/2019   HDL 54.50 05/14/2019   LDLDIRECT 182.4 09/11/2011   LDLCALC 67 05/14/2019   ALT 15 05/14/2019   AST 20 05/14/2019   NA 136 05/14/2019   K 3.8 05/14/2019   CL 98 05/14/2019   CREATININE 0.74 05/14/2019   BUN 15 05/14/2019   CO2 30 05/14/2019   TSH 2.42 05/14/2019   INR 0.9 09/11/2011   HGBA1C 7.3 (H) 05/14/2019    Lab Results  Component Value Date   TSH 2.42 05/14/2019   Lab Results  Component Value Date   WBC 5.9 05/14/2019   HGB 14.6 05/14/2019   HCT 43.9 05/14/2019   MCV 89.4 05/14/2019   PLT 220.0 05/14/2019   Lab Results  Component Value Date   NA 136 05/14/2019   K 3.8 05/14/2019   CO2 30 05/14/2019   GLUCOSE 140 (H) 05/14/2019   BUN 15 05/14/2019   CREATININE 0.74 05/14/2019   BILITOT 0.5 05/14/2019   ALKPHOS 64  05/14/2019   AST 20 05/14/2019   ALT 15 05/14/2019   PROT 7.0 05/14/2019   ALBUMIN 4.3 05/14/2019   CALCIUM 9.6 05/14/2019   GFR 76.70 05/14/2019   Lab Results  Component Value Date   CHOL 140 05/14/2019   Lab Results  Component Value Date   HDL 54.50 05/14/2019   Lab Results  Component Value Date   LDLCALC 67 05/14/2019   Lab Results  Component Value Date   TRIG 95.0 05/14/2019   Lab Results  Component Value Date   CHOLHDL 3 05/14/2019   Lab Results  Component Value Date   HGBA1C 7.3 (H) 05/14/2019       Assessment & Plan:   Problem List Items Addressed This Visit    DM (diabetes mellitus), type 2 with neurological complications ([place])    [id number] acceptable, minimize simple carbs. Increase exercise as tolerated. Continue current meds. A1C is now above 7 discussed medication vs more aggressive lifestyle changes. She chooses to proceed with more intensive lifestyle modifications and recheck labs in 3 months      GOUT, UNSPECIFIED    No recent flares, continue to hydrate well and avoid offending foods.       ESSENTIAL HYPERTENSION, BENIGN    Well controlled, no changes to meds. Encouraged heart healthy diet such as the DASH diet and exercise as tolerated.       Hyperlipidemia    Encouraged heart healthy diet, increase exercise, avoid  trans fats, consider a krill oil cap daily       Other Visit Diagnoses    Needs flu shot    -  Primary   Relevant Orders   Flu Vaccine QUAD High Dose(Fluad) (Completed)      I have discontinued Glenda Spelman. Choudhry's amoxicillin. I am also having her maintain her B Complex Vitamins (VITAMIN-B COMPLEX PO), aspirin, OVER THE COUNTER MEDICATION, latanoprost, glucose blood, blood glucose meter kit and supplies, hydrochlorothiazide, rosuvastatin, and lisinopril.  No orders of the defined types were placed in this encounter.    Penni Homans, MD

## 2019-05-20 NOTE — Assessment & Plan Note (Signed)
Scalp Mohs surgery on December 2

## 2019-05-20 NOTE — Assessment & Plan Note (Signed)
Improved with daily Apple cider, honey and warm water beverage daily

## 2019-05-20 NOTE — Assessment & Plan Note (Signed)
Encouraged heart healthy diet, increase exercise, avoid trans fats, consider a krill oil cap daily 

## 2019-05-20 NOTE — Assessment & Plan Note (Addendum)
Aortic Valve is approaching the need for replacement is following closely with cardiology in [place]

## 2019-05-20 NOTE — Assessment & Plan Note (Signed)
No recent flares, continue to hydrate well and avoid offending foods.

## 2019-06-08 ENCOUNTER — Encounter: Payer: Self-pay | Admitting: Family Medicine

## 2019-07-05 ENCOUNTER — Encounter: Payer: Self-pay | Admitting: Family Medicine

## 2019-07-06 ENCOUNTER — Other Ambulatory Visit: Payer: Self-pay | Admitting: Family Medicine

## 2019-07-06 MED ORDER — METFORMIN HCL 500 MG PO TABS: 500.0000 mg | ORAL_TABLET | Freq: Every day | ORAL | 3 refills | Status: DC

## 2019-07-06 NOTE — Telephone Encounter (Signed)
Please advise 

## 2019-07-18 ENCOUNTER — Other Ambulatory Visit: Payer: Self-pay | Admitting: Family Medicine

## 2019-08-04 ENCOUNTER — Encounter: Payer: Self-pay | Admitting: Family Medicine

## 2019-08-20 ENCOUNTER — Ambulatory Visit: Payer: Self-pay | Admitting: *Deleted

## 2019-08-28 ENCOUNTER — Other Ambulatory Visit: Payer: Medicare Other

## 2019-09-04 ENCOUNTER — Ambulatory Visit: Payer: Medicare Other | Admitting: Family Medicine

## 2019-09-16 ENCOUNTER — Other Ambulatory Visit (INDEPENDENT_AMBULATORY_CARE_PROVIDER_SITE_OTHER): Payer: Medicare Other

## 2019-09-16 ENCOUNTER — Other Ambulatory Visit: Payer: Self-pay

## 2019-09-16 DIAGNOSIS — E1149 Type 2 diabetes mellitus with other diabetic neurological complication: Secondary | ICD-10-CM

## 2019-09-16 DIAGNOSIS — I10 Essential (primary) hypertension: Secondary | ICD-10-CM

## 2019-09-16 DIAGNOSIS — E785 Hyperlipidemia, unspecified: Secondary | ICD-10-CM

## 2019-09-16 DIAGNOSIS — E042 Nontoxic multinodular goiter: Secondary | ICD-10-CM | POA: Diagnosis not present

## 2019-09-16 LAB — COMPREHENSIVE METABOLIC PANEL
ALT: 13 U/L (ref 0–35)
AST: 19 U/L (ref 0–37)
Albumin: 4.2 g/dL (ref 3.5–5.2)
Alkaline Phosphatase: 71 U/L (ref 39–117)
BUN: 16 mg/dL (ref 6–23)
CO2: 30 mEq/L (ref 19–32)
Calcium: 9.3 mg/dL (ref 8.4–10.5)
Chloride: 98 mEq/L (ref 96–112)
Creatinine, Ser: 0.8 mg/dL (ref 0.40–1.20)
GFR: 70.04 mL/min (ref >60.00)
Glucose, Bld: 143 mg/dL — ABNORMAL HIGH (ref 70–99)
Potassium: 4 mEq/L (ref 3.5–5.1)
Sodium: 135 mEq/L (ref 135–145)
Total Bilirubin: 0.6 mg/dL (ref 0.2–1.2)
Total Protein: 6.9 g/dL (ref 6.0–8.3)

## 2019-09-16 LAB — LIPID PANEL
Cholesterol: 129 mg/dL (ref 0–200)
HDL: 51.2 mg/dL (ref >39.00)
LDL Cholesterol: 55 mg/dL (ref 0–99)
NonHDL: 77.53
Total CHOL/HDL Ratio: 3
Triglycerides: 112 mg/dL (ref 0.0–149.0)
VLDL: 22.4 mg/dL (ref 0.0–40.0)

## 2019-09-16 LAB — CBC
HCT: 42.9 % (ref 36.0–46.0)
Hemoglobin: 14.3 g/dL (ref 12.0–15.0)
MCHC: 33.3 g/dL (ref 30.0–36.0)
MCV: 87.1 fl (ref 78.0–100.0)
Platelets: 222 10*3/uL (ref 150.0–400.0)
RBC: 4.93 Mil/uL (ref 3.87–5.11)
RDW: 13.9 % (ref 11.5–15.5)
WBC: 9.2 10*3/uL (ref 4.0–10.5)

## 2019-09-16 LAB — HEMOGLOBIN A1C: Hgb A1c MFr Bld: 7.3 % — ABNORMAL HIGH (ref 4.6–6.5)

## 2019-09-16 LAB — TSH: TSH: 2.83 u[IU]/mL (ref 0.35–4.50)

## 2019-09-22 ENCOUNTER — Other Ambulatory Visit: Payer: Self-pay

## 2019-09-22 ENCOUNTER — Ambulatory Visit (INDEPENDENT_AMBULATORY_CARE_PROVIDER_SITE_OTHER): Payer: Medicare Other | Admitting: Family Medicine

## 2019-09-22 DIAGNOSIS — M109 Gout, unspecified: Secondary | ICD-10-CM

## 2019-09-22 DIAGNOSIS — I38 Endocarditis, valve unspecified: Secondary | ICD-10-CM

## 2019-09-22 DIAGNOSIS — I10 Essential (primary) hypertension: Secondary | ICD-10-CM | POA: Diagnosis not present

## 2019-09-22 DIAGNOSIS — E042 Nontoxic multinodular goiter: Secondary | ICD-10-CM | POA: Diagnosis not present

## 2019-09-22 DIAGNOSIS — E785 Hyperlipidemia, unspecified: Secondary | ICD-10-CM

## 2019-09-22 DIAGNOSIS — E1149 Type 2 diabetes mellitus with other diabetic neurological complication: Secondary | ICD-10-CM

## 2019-09-22 MED ORDER — METFORMIN HCL 500 MG PO TABS: 500.0000 mg | ORAL_TABLET | Freq: Every day | ORAL | 1 refills | Status: DC

## 2019-09-22 NOTE — Progress Notes (Signed)
Virtual Visit via phone Note  I connected with Andrea Calderon on 09/22/19 at  2:20 PM EST by a phone enabled telemedicine application and verified that I am speaking with the correct person using two identifiers.  Location: Patient: home Provider: office   I discussed the limitations of evaluation and management by telemedicine and the availability of in person appointments. The patient expressed understanding and agreed to proceed. Andrea Calderon, CMA was able to get the patient setup on a phone visit    Subjective:    Patient ID: Andrea Calderon, female    DOB: 1945-09-19, 74 y.o.   MRN: [id number]  No chief complaint on file.   HPI Patient is in today for follow up on chronic medical cocnerns. She has gotten her Pfizer vaccine and tolerated well. She is walking more and trying to eat well but she is noting increased dyspnea  No recent fever. No recent hospitalizations or febrile illness. She does endorse some fatigue with increased sleep. Denies CP/palp/SOB/HA/congestion/fevers/GI or GU c/o. Taking meds as prescribed  Past Medical History:  Diagnosis Date  . BCC (basal cell carcinoma), face 12/05/2014   [facility] Altamese Cabal and Dr Ferdinand Lango  . Cancer (HCC)    BCC right nose  . Diabetes mellitus   . H/O: stroke 09/11/2011   September 2012 acute right retinal artery occlusion H/o left retinal artery embolic event by imaging H/o blood clot in port during chemotherapy   . Multiple thyroid nodules 06/13/2012  . Oral lichen planus 08/13/4079  . Other and unspecified hyperlipidemia 12/16/2012  . Preventative health care 06/21/2013  . Rosacea   . Valvular heart disease 06/13/2012   Cardiologist Dr Darral Dash of [facility] in [facility]    Past Surgical History:  Procedure Laterality Date  . ABDOMINAL HYSTERECTOMY  2009   total  . COLECTOMY  2009  . MOHS SURGERY Right    for [facility] on right nose, Feb 2016  . TONGUE BIOPSY  01/05/15     negative results  . VENTRAL HERNIA REPAIR  11/11   4 lesions found, mesh left in place    Family History  Problem Relation Age of Onset  . Transient ischemic attack Mother   . Hypertension Mother   . Dementia Mother   . Diabetes Father        borderline sugar  . Coronary artery disease Father   . Heart disease Maternal Grandfather   . Heart disease Paternal Grandfather     Social History   Socioeconomic History  . Marital status: Married    Spouse name: Not on file  . Number of children: Not on file  . Years of education: Not on file  . Highest education level: Not on file  Occupational History  . Not on file  Tobacco Use  . Smoking status: Never Smoker  . Smokeless tobacco: Never Used  Substance and Sexual Activity  . Alcohol use: Yes    Alcohol/week: 0.0 standard drinks    Comment: rare, social  . Drug use: No  . Sexual activity: Yes  Other Topics Concern  . Not on file  Social History Narrative  . Not on file   Social Determinants of Health   Financial Resource Strain:   . Difficulty of Paying Living Expenses: Not on file  Food Insecurity:   . Worried About Charity fundraiser in the Last Year: Not on file  . Ran  Out of Food in the Last Year: Not on file  Transportation Needs:   . Lack of Transportation (Medical): Not on file  . Lack of Transportation (Non-Medical): Not on file  Physical Activity:   . Days of Exercise per Week: Not on file  . Minutes of Exercise per Session: Not on file  Stress:   . Feeling of Stress : Not on file  Social Connections:   . Frequency of Communication with Friends and Family: Not on file  . Frequency of Social Gatherings with Friends and Family: Not on file  . Attends Religious Services: Not on file  . Active Member of Clubs or Organizations: Not on file  . Attends Archivist Meetings: Not on file  . Marital Status: Not on file  Intimate Partner Violence:   . Fear of Current or Ex-Partner: Not on file  .  Emotionally Abused: Not on file  . Physically Abused: Not on file  . Sexually Abused: Not on file    Outpatient Medications Prior to Visit  Medication Sig Dispense Refill  . aspirin 81 MG tablet Take 81 mg by mouth daily.    . B Complex Vitamins (VITAMIN-B COMPLEX PO) Take 1 tablet by mouth daily. Reported on 12/22/2015    . blood glucose meter kit and supplies Dispense based on patient and insurance preference. Use up to rwo times daily as directed. DX: E11.9). Aviva Plus meter 1 each 0  . glucose blood (ACCU-CHEK AVIVA PLUS) test strip Check blood sugars twice daily 100 each 12  . hydrochlorothiazide (HYDRODIURIL) 25 MG tablet TAKE 1 TABLET EVERY DAY 90 tablet 1  . latanoprost (XALATAN) 0.005 % ophthalmic solution Place 1 drop into both eyes daily.  3  . lisinopril (ZESTRIL) 20 MG tablet TAKE 1 TABLET EVERY DAY 90 tablet 1  . OVER THE COUNTER MEDICATION Vita Fusion Fiber- 2 daily    . rosuvastatin (CRESTOR) 10 MG tablet TAKE 1 TABLET EVERY DAY 90 tablet 1  . metFORMIN (GLUCOPHAGE) 500 MG tablet Take 1 tablet (500 mg total) by mouth daily with breakfast. 30 tablet 3   No facility-administered medications prior to visit.    Allergies  Allergen Reactions  . Codeine Nausea And Vomiting    Review of Systems  Constitutional: Negative for fever and malaise/fatigue.  HENT: Negative for congestion.   Eyes: Negative for blurred vision.  Respiratory: Negative for shortness of breath.   Cardiovascular: Negative for chest pain, palpitations and leg swelling.  Gastrointestinal: Negative for abdominal pain, blood in stool and nausea.  Genitourinary: Negative for dysuria and frequency.  Musculoskeletal: Negative for falls.  Skin: Negative for rash.  Neurological: Negative for dizziness, loss of consciousness and headaches.  Endo/Heme/Allergies: Negative for environmental allergies.  Psychiatric/Behavioral: Negative for depression. The patient is not nervous/anxious.        Objective:      Physical Exam unable to obtain via phone  There were no vitals taken for this visit. Wt Readings from Last 3 Encounters:  05/19/19 156 lb 6.4 oz (70.9 kg)  08/18/18 156 lb 6.4 oz (70.9 kg)  06/24/18 156 lb (70.8 kg)    Diabetic Foot Exam - Simple   No data filed     Lab Results  Component Value Date   WBC 9.2 09/16/2019   HGB 14.3 09/16/2019   HCT 42.9 09/16/2019   PLT 222.0 09/16/2019   GLUCOSE 143 (H) 09/16/2019   CHOL 129 09/16/2019   TRIG 112.0 09/16/2019   HDL 51.20 09/16/2019  LDLDIRECT 182.4 09/11/2011   LDLCALC 55 09/16/2019   ALT 13 09/16/2019   AST 19 09/16/2019   NA 135 09/16/2019   K 4.0 09/16/2019   CL 98 09/16/2019   CREATININE 0.80 09/16/2019   BUN 16 09/16/2019   CO2 30 09/16/2019   TSH 2.83 09/16/2019   INR 0.9 09/11/2011   HGBA1C 7.3 (H) 09/16/2019    Lab Results  Component Value Date   TSH 2.83 09/16/2019   Lab Results  Component Value Date   WBC 9.2 09/16/2019   HGB 14.3 09/16/2019   HCT 42.9 09/16/2019   MCV 87.1 09/16/2019   PLT 222.0 09/16/2019   Lab Results  Component Value Date   NA 135 09/16/2019   K 4.0 09/16/2019   CO2 30 09/16/2019   GLUCOSE 143 (H) 09/16/2019   BUN 16 09/16/2019   CREATININE 0.80 09/16/2019   BILITOT 0.6 09/16/2019   ALKPHOS 71 09/16/2019   AST 19 09/16/2019   ALT 13 09/16/2019   PROT 6.9 09/16/2019   ALBUMIN 4.2 09/16/2019   CALCIUM 9.3 09/16/2019   GFR 70.04 09/16/2019   Lab Results  Component Value Date   CHOL 129 09/16/2019   Lab Results  Component Value Date   HDL 51.20 09/16/2019   Lab Results  Component Value Date   LDLCALC 55 09/16/2019   Lab Results  Component Value Date   TRIG 112.0 09/16/2019   Lab Results  Component Value Date   CHOLHDL 3 09/16/2019   Lab Results  Component Value Date   HGBA1C 7.3 (H) 09/16/2019       Assessment & Plan:   Problem List Items Addressed This Visit    DM (diabetes mellitus), type 2 with neurological complications ([place])    [id number]  acceptable, minimize simple carbs. Increase exercise as tolerated. Continue current meds      Relevant Medications   metFORMIN (GLUCOPHAGE) 500 MG tablet   Other Relevant Orders   Hemoglobin A1c   Comprehensive metabolic panel   GOUT, UNSPECIFIED    No recent flare continue to hydrate well and monitor uric acid      ESSENTIAL HYPERTENSION, BENIGN - Primary    Well controlled, no changes to meds. Encouraged heart healthy diet such as the DASH diet and exercise as tolerated.       Relevant Orders   CBC   TSH   Valvular heart disease    Patient underwent TAVR with good results.       Multiple thyroid nodules   Relevant Orders   TSH   Hyperlipidemia    Encouraged heart healthy diet, increase exercise, avoid trans fats, consider a krill oil cap daily      Relevant Orders   Lipid panel      I am having Diem Dicocco. Zinn maintain her B Complex Vitamins (VITAMIN-B COMPLEX PO), aspirin, OVER THE COUNTER MEDICATION, latanoprost, glucose blood, blood glucose meter kit and supplies, hydrochlorothiazide, rosuvastatin, lisinopril, and metFORMIN.  Meds ordered this encounter  Medications  . metFORMIN (GLUCOPHAGE) 500 MG tablet    Sig: Take 1 tablet (500 mg total) by mouth daily with breakfast.    Dispense:  90 tablet    Refill:  1    Do not fill til patient requests     I discussed the assessment and treatment plan with the patient. The patient was provided an opportunity to ask questions and all were answered. The patient agreed with the plan and demonstrated an understanding of the instructions.   The  patient was advised to call back or seek an in-person evaluation if the symptoms worsen or if the condition fails to improve as anticipated.  I provided 25 minutes of non-face-to-face time during this encounter.   Penni Homans, MD

## 2019-09-22 NOTE — Assessment & Plan Note (Signed)
Patient underwent TAVR with good results.

## 2019-09-22 NOTE — Assessment & Plan Note (Signed)
No recent flare continue to hydrate well and monitor uric acid

## 2019-09-22 NOTE — Assessment & Plan Note (Signed)
Well controlled, no changes to meds. Encouraged heart healthy diet such as the DASH diet and exercise as tolerated.  °

## 2019-09-22 NOTE — Assessment & Plan Note (Signed)
hgba1c acceptable, minimize simple carbs. Increase exercise as tolerated. Continue current meds 

## 2019-09-22 NOTE — Assessment & Plan Note (Signed)
Encouraged heart healthy diet, increase exercise, avoid trans fats, consider a krill oil cap daily 

## 2020-01-03 ENCOUNTER — Other Ambulatory Visit: Payer: Self-pay | Admitting: Family Medicine

## 2020-01-15 ENCOUNTER — Other Ambulatory Visit (INDEPENDENT_AMBULATORY_CARE_PROVIDER_SITE_OTHER): Payer: Medicare Other

## 2020-01-15 ENCOUNTER — Other Ambulatory Visit: Payer: Self-pay

## 2020-01-15 DIAGNOSIS — E042 Nontoxic multinodular goiter: Secondary | ICD-10-CM | POA: Diagnosis not present

## 2020-01-15 DIAGNOSIS — E1149 Type 2 diabetes mellitus with other diabetic neurological complication: Secondary | ICD-10-CM

## 2020-01-15 DIAGNOSIS — E785 Hyperlipidemia, unspecified: Secondary | ICD-10-CM

## 2020-01-15 DIAGNOSIS — I10 Essential (primary) hypertension: Secondary | ICD-10-CM

## 2020-01-15 LAB — CBC
HCT: 43.7 % (ref 36.0–46.0)
Hemoglobin: 14.6 g/dL (ref 12.0–15.0)
MCHC: 33.3 g/dL (ref 30.0–36.0)
MCV: 87.1 fl (ref 78.0–100.0)
Platelets: 210 10*3/uL (ref 150.0–400.0)
RBC: 5.02 Mil/uL (ref 3.87–5.11)
RDW: 13.8 % (ref 11.5–15.5)
WBC: 6.6 10*3/uL (ref 4.0–10.5)

## 2020-01-15 LAB — COMPREHENSIVE METABOLIC PANEL
ALT: 16 U/L (ref 0–35)
AST: 21 U/L (ref 0–37)
Albumin: 4.4 g/dL (ref 3.5–5.2)
Alkaline Phosphatase: 66 U/L (ref 39–117)
BUN: 19 mg/dL (ref 6–23)
CO2: 30 mEq/L (ref 19–32)
Calcium: 9.7 mg/dL (ref 8.4–10.5)
Chloride: 97 mEq/L (ref 96–112)
Creatinine, Ser: 0.8 mg/dL (ref 0.40–1.20)
GFR: 69.97 mL/min (ref >60.00)
Glucose, Bld: 144 mg/dL — ABNORMAL HIGH (ref 70–99)
Potassium: 4.4 mEq/L (ref 3.5–5.1)
Sodium: 134 mEq/L — ABNORMAL LOW (ref 135–145)
Total Bilirubin: 0.6 mg/dL (ref 0.2–1.2)
Total Protein: 7.1 g/dL (ref 6.0–8.3)

## 2020-01-15 LAB — LIPID PANEL
Cholesterol: 153 mg/dL (ref 0–200)
HDL: 59.6 mg/dL (ref >39.00)
LDL Cholesterol: 68 mg/dL (ref 0–99)
NonHDL: 93.58
Total CHOL/HDL Ratio: 3
Triglycerides: 130 mg/dL (ref 0.0–149.0)
VLDL: 26 mg/dL (ref 0.0–40.0)

## 2020-01-15 LAB — TSH: TSH: 3.22 u[IU]/mL (ref 0.35–4.50)

## 2020-01-15 LAB — HEMOGLOBIN A1C: Hgb A1c MFr Bld: 7.5 % — ABNORMAL HIGH (ref 4.6–6.5)

## 2020-01-19 ENCOUNTER — Other Ambulatory Visit: Payer: Self-pay

## 2020-01-19 ENCOUNTER — Encounter: Payer: Self-pay | Admitting: Family Medicine

## 2020-01-19 ENCOUNTER — Ambulatory Visit (INDEPENDENT_AMBULATORY_CARE_PROVIDER_SITE_OTHER): Payer: Medicare Other | Admitting: Family Medicine

## 2020-01-19 DIAGNOSIS — M109 Gout, unspecified: Secondary | ICD-10-CM

## 2020-01-19 DIAGNOSIS — I10 Essential (primary) hypertension: Secondary | ICD-10-CM | POA: Diagnosis not present

## 2020-01-19 DIAGNOSIS — E785 Hyperlipidemia, unspecified: Secondary | ICD-10-CM | POA: Diagnosis not present

## 2020-01-19 DIAGNOSIS — I38 Endocarditis, valve unspecified: Secondary | ICD-10-CM | POA: Diagnosis not present

## 2020-01-19 DIAGNOSIS — E1149 Type 2 diabetes mellitus with other diabetic neurological complication: Secondary | ICD-10-CM

## 2020-01-19 MED ORDER — METFORMIN HCL 500 MG PO TABS
500.0000 mg | ORAL_TABLET | Freq: Two times a day (BID) | ORAL | 1 refills | Status: DC
Start: 2020-01-19 — End: 2020-06-06

## 2020-01-19 NOTE — Patient Instructions (Signed)
Hydrate with 60-80 ounces of fluids daily Carbohydrate Counting for Diabetes Mellitus, Adult  Carbohydrate counting is a method of keeping track of how many carbohydrates you eat. Eating carbohydrates naturally increases the amount of sugar (glucose) in the blood. Counting how many carbohydrates you eat helps keep your blood glucose within normal limits, which helps you manage your diabetes (diabetes mellitus). It is important to know how many carbohydrates you can safely have in each meal. This is different for every person. A diet and nutrition specialist (registered dietitian) can help you make a meal plan and calculate how many carbohydrates you should have at each meal and snack. Carbohydrates are found in the following foods:  Grains, such as breads and cereals.  Dried beans and soy products.  Starchy vegetables, such as potatoes, peas, and corn.  Fruit and fruit juices.  Milk and yogurt.  Sweets and snack foods, such as cake, cookies, candy, chips, and soft drinks. How do I count carbohydrates? There are two ways to count carbohydrates in food. You can use either of the methods or a combination of both. Reading "Nutrition Facts" on packaged food The "Nutrition Facts" list is included on the labels of almost all packaged foods and beverages in the [place] It includes:  The serving size.  Information about nutrients in each serving, including the grams (g) of carbohydrate per serving. To use the "Nutrition Facts":  Decide how many servings you will have.  Multiply the number of servings by the number of carbohydrates per serving.  The resulting number is the total amount of carbohydrates that you will be having. Learning standard serving sizes of other foods When you eat carbohydrate foods that are not packaged or do not include "Nutrition Facts" on the label, you need to measure the servings in order to count the amount of carbohydrates:  Measure the foods that you will eat  with a food scale or measuring cup, if needed.  Decide how many standard-size servings you will eat.  Multiply the number of servings by 15. Most carbohydrate-rich foods have about 15 g of carbohydrates per serving. ? For example, if you eat 8 oz (170 g) of strawberries, you will have eaten 2 servings and 30 g of carbohydrates (2 servings x 15 g = 30 g).  For foods that have more than one food mixed, such as soups and casseroles, you must count the carbohydrates in each food that is included. The following list contains standard serving sizes of common carbohydrate-rich foods. Each of these servings has about 15 g of carbohydrates:   hamburger bun or  English muffin.   oz (15 mL) syrup.   oz (14 g) jelly.  1 slice of bread.  1 six-inch tortilla.  3 oz (85 g) cooked rice or pasta.  4 oz (113 g) cooked dried beans.  4 oz (113 g) starchy vegetable, such as peas, corn, or potatoes.  4 oz (113 g) hot cereal.  4 oz (113 g) mashed potatoes or  of a large baked potato.  4 oz (113 g) canned or frozen fruit.  4 oz (120 mL) fruit juice.  4-6 crackers.  6 chicken nuggets.  6 oz (170 g) unsweetened dry cereal.  6 oz (170 g) plain fat-free yogurt or yogurt sweetened with artificial sweeteners.  8 oz (240 mL) milk.  8 oz (170 g) fresh fruit or one small piece of fruit.  24 oz (680 g) popped popcorn. Example of carbohydrate counting Sample meal  3 oz (85 g)  chicken breast.  6 oz (170 g) brown rice.  4 oz (113 g) corn.  8 oz (240 mL) milk.  8 oz (170 g) strawberries with sugar-free whipped topping. Carbohydrate calculation 1. Identify the foods that contain carbohydrates: ? Rice. ? Corn. ? Milk. ? Strawberries. 2. Calculate how many servings you have of each food: ? 2 servings rice. ? 1 serving corn. ? 1 serving milk. ? 1 serving strawberries. 3. Multiply each number of servings by 15 g: ? 2 servings rice x 15 g = 30 g. ? 1 serving corn x 15 g = 15 g. ? 1  serving milk x 15 g = 15 g. ? 1 serving strawberries x 15 g = 15 g. 4. Add together all of the amounts to find the total grams of carbohydrates eaten: ? 30 g + 15 g + 15 g + 15 g = 75 g of carbohydrates total. Summary  Carbohydrate counting is a method of keeping track of how many carbohydrates you eat.  Eating carbohydrates naturally increases the amount of sugar (glucose) in the blood.  Counting how many carbohydrates you eat helps keep your blood glucose within normal limits, which helps you manage your diabetes.  A diet and nutrition specialist (registered dietitian) can help you make a meal plan and calculate how many carbohydrates you should have at each meal and snack. This information is not intended to replace advice given to you by your health care provider. Make sure you discuss any questions you have with your health care provider. Document Revised: 02/14/2017 Document Reviewed: 01/04/2016 Elsevier Patient Education  [place].

## 2020-01-19 NOTE — Progress Notes (Signed)
Subjective:    Patient ID: Andrea Calderon, female    DOB: [birth year], 74 y.o.   MRN: [id number]  Chief Complaint  Patient presents with   Follow-up    HPI Patient is in today for follow up on chronic medical concerns. No recent febrile illness or hospitalizations. She notes cardiology is recommending hse undergo a TAVR procedure in the fall. She is not highly symptomatic but her valvular dysfunction is worsening. She is leaning towards undergoing the procedure. Denies CP/palp/SOB/HA/congestion/fevers/GI or GU c/o. Taking meds as prescribed  Past Medical History:  Diagnosis Date   BCC (basal cell carcinoma), face 12/05/2014   [facility] Altamese Cabal and Dr Ferdinand Lango   Cancer [facility])    [facility] right nose   Diabetes mellitus    H/O: stroke 09/11/2011   September 2012 acute right retinal artery occlusion H/o left retinal artery embolic event by imaging H/o blood clot in port during chemotherapy    Multiple thyroid nodules 05/08/1593   Oral lichen planus 12/12/5927   Other and unspecified hyperlipidemia 12/16/2012   Preventative health care 06/21/2013   Rosacea    Valvular heart disease 06/13/2012   Cardiologist Dr Darral Dash of [facility] in [facility]    Past Surgical History:  Procedure Laterality Date   ABDOMINAL HYSTERECTOMY  2009   total   COLECTOMY  2009   MOHS SURGERY Right    for [facility] on right nose, Feb 2016   TONGUE BIOPSY  01/05/15   negative results   VENTRAL HERNIA REPAIR  11/11   4 lesions found, mesh left in place    Family History  Problem Relation Age of Onset   Transient ischemic attack Mother    Hypertension Mother    Dementia Mother    Diabetes Father        borderline sugar   Coronary artery disease Father    Heart disease Maternal Grandfather    Heart disease Paternal Grandfather     Social History   Socioeconomic History   Marital status: Married    Spouse name: Not on  file   Number of children: Not on file   Years of education: Not on file   Highest education level: Not on file  Occupational History   Not on file  Tobacco Use   Smoking status: Never Smoker   Smokeless tobacco: Never Used  Substance and Sexual Activity   Alcohol use: Yes    Alcohol/week: 0.0 standard drinks    Comment: rare, social   Drug use: No   Sexual activity: Yes  Other Topics Concern   Not on file  Social History Narrative   Not on file   Social Determinants of Health   Financial Resource Strain:    Difficulty of Paying Living Expenses:   Food Insecurity:    Worried About Charity fundraiser in the Last Year:    Arboriculturist in the Last Year:   Transportation Needs:    Film/video editor (Medical):    Lack of Transportation (Non-Medical):   Physical Activity:    Days of Exercise per Week:    Minutes of Exercise per Session:   Stress:    Feeling of Stress :   Social Connections:    Frequency of Communication with Friends and Family:    Frequency of Social Gatherings with Friends and Family:    Attends Religious Services:    Active Member of  Clubs or Organizations:    Attends Music therapist:    Marital Status:   Intimate Partner Violence:    Fear of Current or Ex-Partner:    Emotionally Abused:    Physically Abused:    Sexually Abused:     Outpatient Medications Prior to Visit  Medication Sig Dispense Refill   aspirin 81 MG tablet Take 81 mg by mouth daily.     B Complex Vitamins (VITAMIN-B COMPLEX PO) Take 1 tablet by mouth daily. Reported on 12/22/2015     blood glucose meter kit and supplies Dispense based on patient and insurance preference. Use up to rwo times daily as directed. DX: E11.9). Aviva Plus meter 1 each 0   glucose blood (ACCU-CHEK AVIVA PLUS) test strip Check blood sugars twice daily 100 each 12   hydrochlorothiazide (HYDRODIURIL) 25 MG tablet TAKE 1 TABLET EVERY DAY 90 tablet 1    latanoprost (XALATAN) 0.005 % ophthalmic solution Place 1 drop into both eyes daily.  3   lisinopril (ZESTRIL) 20 MG tablet TAKE 1 TABLET EVERY DAY 90 tablet 1   metFORMIN (GLUCOPHAGE) 500 MG tablet Take 1 tablet (500 mg total) by mouth daily with breakfast. 90 tablet 1   OVER THE COUNTER MEDICATION Vita Fusion Fiber- 2 daily     rosuvastatin (CRESTOR) 10 MG tablet TAKE 1 TABLET EVERY DAY 90 tablet 1   No facility-administered medications prior to visit.    Allergies  Allergen Reactions   Codeine Nausea And Vomiting    Review of Systems  Constitutional: Negative for fever and malaise/fatigue.  HENT: Negative for congestion.   Eyes: Negative for blurred vision.  Respiratory: Negative for shortness of breath.   Cardiovascular: Negative for chest pain, palpitations and leg swelling.  Gastrointestinal: Negative for abdominal pain, blood in stool and nausea.  Genitourinary: Negative for dysuria and frequency.  Musculoskeletal: Negative for falls.  Skin: Negative for rash.  Neurological: Negative for dizziness, loss of consciousness and headaches.  Endo/Heme/Allergies: Negative for environmental allergies.  Psychiatric/Behavioral: Negative for depression. The patient is not nervous/anxious.        Objective:    Physical Exam Vitals and nursing note reviewed.  Constitutional:      General: She is not in acute distress.    Appearance: She is well-developed.  HENT:     Head: Normocephalic and atraumatic.     Nose: Nose normal.  Eyes:     General:        Right eye: No discharge.        Left eye: No discharge.  Cardiovascular:     Rate and Rhythm: Normal rate and regular rhythm.     Heart sounds: Murmur heard.   Pulmonary:     Effort: Pulmonary effort is normal.     Breath sounds: Normal breath sounds.  Abdominal:     General: Bowel sounds are normal.     Palpations: Abdomen is soft.     Tenderness: There is no abdominal tenderness.  Musculoskeletal:     Cervical  back: Normal range of motion and neck supple.  Skin:    General: Skin is warm and dry.  Neurological:     Mental Status: She is alert and oriented to person, place, and time.     BP 112/66 (BP Location: Left Arm, Cuff Size: Normal)    Pulse 69    Temp 98 F (36.7 C) (Temporal)    Resp 12    Ht 5' 2"  (1.575 m)    Wt  157 lb (71.2 kg)    SpO2 98%    BMI 28.72 kg/m  Wt Readings from Last 3 Encounters:  01/19/20 157 lb (71.2 kg)  05/19/19 156 lb 6.4 oz (70.9 kg)  08/18/18 156 lb 6.4 oz (70.9 kg)    Diabetic Foot Exam - Simple   No data filed     Lab Results  Component Value Date   WBC 6.6 01/15/2020   HGB 14.6 01/15/2020   HCT 43.7 01/15/2020   PLT 210.0 01/15/2020   GLUCOSE 144 (H) 01/15/2020   CHOL 153 01/15/2020   TRIG 130.0 01/15/2020   HDL 59.60 01/15/2020   LDLDIRECT 182.4 09/11/2011   LDLCALC 68 01/15/2020   ALT 16 01/15/2020   AST 21 01/15/2020   NA 134 (L) 01/15/2020   K 4.4 01/15/2020   CL 97 01/15/2020   CREATININE 0.80 01/15/2020   BUN 19 01/15/2020   CO2 30 01/15/2020   TSH 3.22 01/15/2020   INR 0.9 09/11/2011   HGBA1C 7.5 (H) 01/15/2020    Lab Results  Component Value Date   TSH 3.22 01/15/2020   Lab Results  Component Value Date   WBC 6.6 01/15/2020   HGB 14.6 01/15/2020   HCT 43.7 01/15/2020   MCV 87.1 01/15/2020   PLT 210.0 01/15/2020   Lab Results  Component Value Date   NA 134 (L) 01/15/2020   K 4.4 01/15/2020   CO2 30 01/15/2020   GLUCOSE 144 (H) 01/15/2020   BUN 19 01/15/2020   CREATININE 0.80 01/15/2020   BILITOT 0.6 01/15/2020   ALKPHOS 66 01/15/2020   AST 21 01/15/2020   ALT 16 01/15/2020   PROT 7.1 01/15/2020   ALBUMIN 4.4 01/15/2020   CALCIUM 9.7 01/15/2020   GFR 69.97 01/15/2020   Lab Results  Component Value Date   CHOL 153 01/15/2020   Lab Results  Component Value Date   HDL 59.60 01/15/2020   Lab Results  Component Value Date   LDLCALC 68 01/15/2020   Lab Results  Component Value Date   TRIG 130.0  01/15/2020   Lab Results  Component Value Date   CHOLHDL 3 01/15/2020   Lab Results  Component Value Date   HGBA1C 7.5 (H) 01/15/2020       Assessment & Plan:   Problem List Items Addressed This Visit    DM (diabetes mellitus), type 2 with neurological complications ([place])    OXBD5H acceptable, minimize simple carbs. Increase exercise as tolerated. Continue current meds. Am numbers in the 120s but mid day numbers are up to 160s she is going to       GOUT, UNSPECIFIED    Hydrate and monitor      ESSENTIAL HYPERTENSION, BENIGN    Well controlled, no changes to meds. Encouraged heart healthy diet such as the DASH diet and exercise as tolerated.       Valvular heart disease    Has been advised by cardiology at [facility] that she needs a TAVR in the fall Dr Olena Heckle would be lead surgeon and Dr Ferdinand Lango is secondary.      Hyperlipidemia    Encouraged heart healthy diet, increase exercise, avoid trans fats, consider a krill oil cap daily. Tolerating Rosuvastatin         I am having Andrea Calderon maintain her B Complex Vitamins (VITAMIN-B COMPLEX PO), aspirin, OVER THE COUNTER MEDICATION, latanoprost, glucose blood, blood glucose meter kit and supplies, metFORMIN, rosuvastatin, lisinopril, and hydrochlorothiazide.  No orders of the defined types were  placed in this encounter.    Penni Homans, MD

## 2020-01-19 NOTE — Assessment & Plan Note (Signed)
Well controlled, no changes to meds. Encouraged heart healthy diet such as the DASH diet and exercise as tolerated.  °

## 2020-01-19 NOTE — Assessment & Plan Note (Signed)
Encouraged heart healthy diet, increase exercise, avoid trans fats, consider a krill oil cap daily. Tolerating Rosuvastatin 

## 2020-01-19 NOTE — Assessment & Plan Note (Addendum)
hgba1c acceptable, minimize simple carbs. Increase exercise as tolerated. Continue current meds. Am numbers in the 120s but mid day numbers are up to 160s she is going to

## 2020-01-19 NOTE — Assessment & Plan Note (Signed)
Hydrate and monitor 

## 2020-01-19 NOTE — Assessment & Plan Note (Signed)
Has been advised by cardiology at [facility] that she needs a TAVR in the fall Dr Olena Heckle would be lead surgeon and Dr Ferdinand Lango is secondary.

## 2020-02-22 ENCOUNTER — Telehealth: Payer: Self-pay | Admitting: Family Medicine

## 2020-02-22 NOTE — Telephone Encounter (Signed)
Pt will call back to schedule AWV with [facility].  Last AWV 08/18/18  This is a 45 minute visit.

## 2020-05-23 NOTE — Addendum Note (Signed)
Addended by: Kelle Darting A on: 05/23/2020 02:25 PM   Modules accepted: Orders

## 2020-05-25 ENCOUNTER — Other Ambulatory Visit: Payer: Self-pay

## 2020-05-25 ENCOUNTER — Other Ambulatory Visit (INDEPENDENT_AMBULATORY_CARE_PROVIDER_SITE_OTHER): Payer: Medicare Other

## 2020-05-25 DIAGNOSIS — E1149 Type 2 diabetes mellitus with other diabetic neurological complication: Secondary | ICD-10-CM | POA: Diagnosis not present

## 2020-05-25 DIAGNOSIS — E785 Hyperlipidemia, unspecified: Secondary | ICD-10-CM

## 2020-05-25 DIAGNOSIS — I10 Essential (primary) hypertension: Secondary | ICD-10-CM

## 2020-05-26 ENCOUNTER — Encounter: Payer: Self-pay | Admitting: Family Medicine

## 2020-05-26 LAB — COMPREHENSIVE METABOLIC PANEL
AG Ratio: 1.7 (calc) (ref 1.0–2.5)
ALT: 14 U/L (ref 6–29)
AST: 19 U/L (ref 10–35)
Albumin: 4.2 g/dL (ref 3.6–5.1)
Alkaline phosphatase (APISO): 59 U/L (ref 37–153)
BUN: 15 mg/dL (ref 7–25)
CO2: 28 mmol/L (ref 20–32)
Calcium: 9.4 mg/dL (ref 8.6–10.4)
Chloride: 97 mmol/L — ABNORMAL LOW (ref 98–110)
Creat: 0.74 mg/dL (ref 0.60–0.93)
Globulin: 2.5 g/dL (calc) (ref 1.9–3.7)
Glucose, Bld: 126 mg/dL — ABNORMAL HIGH (ref 65–99)
Potassium: 4.2 mmol/L (ref 3.5–5.3)
Sodium: 135 mmol/L (ref 135–146)
Total Bilirubin: 0.5 mg/dL (ref 0.2–1.2)
Total Protein: 6.7 g/dL (ref 6.1–8.1)

## 2020-05-26 LAB — CBC
HCT: 41.6 % (ref 35.0–45.0)
Hemoglobin: 14.2 g/dL (ref 11.7–15.5)
MCH: 29.8 pg (ref 27.0–33.0)
MCHC: 34.1 g/dL (ref 32.0–36.0)
MCV: 87.4 fL (ref 80.0–100.0)
MPV: 11.2 fL (ref 7.5–12.5)
Platelets: 222 10*3/uL (ref 140–400)
RBC: 4.76 10*6/uL (ref 3.80–5.10)
RDW: 13.2 % (ref 11.0–15.0)
WBC: 6.1 10*3/uL (ref 3.8–10.8)

## 2020-05-26 LAB — LIPID PANEL
Cholesterol: 135 mg/dL (ref <200)
HDL: 54 mg/dL (ref >=50)
LDL Cholesterol (Calc): 61 mg/dL (calc)
Non-HDL Cholesterol (Calc): 81 mg/dL (calc) (ref <130)
Total CHOL/HDL Ratio: 2.5 (calc) (ref <5.0)
Triglycerides: 117 mg/dL (ref <150)

## 2020-05-26 LAB — HEMOGLOBIN A1C
Hgb A1c MFr Bld: 6.8 % of total Hgb — ABNORMAL HIGH (ref <5.7)
Mean Plasma Glucose: 148 (calc)
eAG (mmol/L): 8.2 (calc)

## 2020-05-26 LAB — TSH: TSH: 3.41 mIU/L (ref 0.40–4.50)

## 2020-05-31 ENCOUNTER — Other Ambulatory Visit: Payer: Self-pay

## 2020-05-31 ENCOUNTER — Ambulatory Visit (INDEPENDENT_AMBULATORY_CARE_PROVIDER_SITE_OTHER): Payer: Medicare Other | Admitting: Family Medicine

## 2020-05-31 ENCOUNTER — Encounter: Payer: Self-pay | Admitting: Family Medicine

## 2020-05-31 VITALS — BP 110/72 | HR 62 | Temp 97.0°F | Wt 158.4 lb

## 2020-05-31 DIAGNOSIS — I10 Essential (primary) hypertension: Secondary | ICD-10-CM | POA: Diagnosis not present

## 2020-05-31 DIAGNOSIS — E042 Nontoxic multinodular goiter: Secondary | ICD-10-CM

## 2020-05-31 DIAGNOSIS — R739 Hyperglycemia, unspecified: Secondary | ICD-10-CM

## 2020-05-31 DIAGNOSIS — Z23 Encounter for immunization: Secondary | ICD-10-CM | POA: Diagnosis not present

## 2020-05-31 DIAGNOSIS — E785 Hyperlipidemia, unspecified: Secondary | ICD-10-CM | POA: Diagnosis not present

## 2020-05-31 DIAGNOSIS — E1149 Type 2 diabetes mellitus with other diabetic neurological complication: Secondary | ICD-10-CM

## 2020-05-31 NOTE — Patient Instructions (Signed)
Shingrix is the new shingles 2 shots over 2-6 months at [place] 75 Years and Older, Female Preventive care refers to lifestyle choices and visits with your health care provider that can promote health and wellness. This includes:  A yearly physical exam. This is also called an annual well check.  Regular dental and eye exams.  Immunizations.  Screening for certain conditions.  Healthy lifestyle choices, such as diet and exercise. What can I expect for my preventive care visit? Physical exam Your health care provider will check:  Height and weight. These may be used to calculate body mass index (BMI), which is a measurement that tells if you are at a healthy weight.  Heart rate and blood pressure.  Your skin for abnormal spots. Counseling Your health care provider may ask you questions about:  Alcohol, tobacco, and drug use.  Emotional well-being.  Home and relationship well-being.  Sexual activity.  Eating habits.  History of falls.  Memory and ability to understand (cognition).  Work and work Statistician.  Pregnancy and menstrual history. What immunizations do I need?  Influenza (flu) vaccine  This is recommended every year. Tetanus, diphtheria, and pertussis (Tdap) vaccine  You may need a Td booster every 10 years. Varicella (chickenpox) vaccine  You may need this vaccine if you have not already been vaccinated. Zoster (shingles) vaccine  You may need this after age 75. Pneumococcal conjugate (PCV13) vaccine  One dose is recommended after age 18. Pneumococcal polysaccharide (PPSV23) vaccine  One dose is recommended after age 28. Measles, mumps, and rubella (MMR) vaccine  You may need at least one dose of MMR if you were born in 1957 or later. You may also need a second dose. Meningococcal conjugate (MenACWY) vaccine  You may need this if you have certain conditions. Hepatitis A vaccine  You may need this if you have certain  conditions or if you travel or work in places where you may be exposed to hepatitis A. Hepatitis B vaccine  You may need this if you have certain conditions or if you travel or work in places where you may be exposed to hepatitis B. Haemophilus influenzae type b (Hib) vaccine  You may need this if you have certain conditions. You may receive vaccines as individual doses or as more than one vaccine together in one shot (combination vaccines). Talk with your health care provider about the risks and benefits of combination vaccines. What tests do I need? Blood tests  Lipid and cholesterol levels. These may be checked every 5 years, or more frequently depending on your overall health.  Hepatitis C test.  Hepatitis B test. Screening  Lung cancer screening. You may have this screening every year starting at age 75 if you have a 30-pack-year history of smoking and currently smoke or have quit within the past 15 years.  Colorectal cancer screening. All adults should have this screening starting at age 45 and continuing until age 12. Your health care provider may recommend screening at age 26 if you are at increased risk. You will have tests every 1-10 years, depending on your results and the type of screening test.  Diabetes screening. This is done by checking your blood sugar (glucose) after you have not eaten for a while (fasting). You may have this done every 1-3 years.  Mammogram. This may be done every 1-2 years. Talk with your health care provider about how often you should have regular mammograms.  BRCA-related cancer screening. This may be done if  you have a family history of breast, ovarian, tubal, or peritoneal cancers. Other tests  Sexually transmitted disease (STD) testing.  Bone density scan. This is done to screen for osteoporosis. You may have this done starting at age 45. Follow these instructions at home: Eating and drinking  Eat a diet that includes fresh fruits and  vegetables, whole grains, lean protein, and low-fat dairy products. Limit your intake of foods with high amounts of sugar, saturated fats, and salt.  Take vitamin and mineral supplements as recommended by your health care provider.  Do not drink alcohol if your health care provider tells you not to drink.  If you drink alcohol: ? Limit how much you have to 0-1 drink a day. ? Be aware of how much alcohol is in your drink. In the [place] one drink equals one 12 oz bottle of beer (355 mL), one 5 oz glass of wine (148 mL), or one 1 oz glass of hard liquor (44 mL). Lifestyle  Take daily care of your teeth and gums.  Stay active. Exercise for at least 30 minutes on 5 or more days each week.  Do not use any products that contain nicotine or tobacco, such as cigarettes, e-cigarettes, and chewing tobacco. If you need help quitting, ask your health care provider.  If you are sexually active, practice safe sex. Use a condom or other form of protection in order to prevent STIs (sexually transmitted infections).  Talk with your health care provider about taking a low-dose aspirin or statin. What's next?  Go to your health care provider once a year for a well check visit.  Ask your health care provider how often you should have your eyes and teeth checked.  Stay up to date on all vaccines. This information is not intended to replace advice given to you by your health care provider. Make sure you discuss any questions you have with your health care provider. Document Revised: 07/17/2018 Document Reviewed: 07/17/2018 Elsevier Patient Education  2020 [employer].

## 2020-06-01 NOTE — Assessment & Plan Note (Signed)
hgba1c acceptable, minimize simple carbs. Increase exercise as tolerated. Continue current meds 

## 2020-06-01 NOTE — Progress Notes (Signed)
Subjective:    Patient ID: Andrea Calderon, female    DOB: [birth year], 74 y.o.   MRN: [id number]  Chief Complaint  Patient presents with  . Follow-up    Labs    HPI Patient is in today for follow up on chronic medical concerns. No recent febrile illness or hospitalizations. No polyuria or polydipsia. She is noting her sugars have been well controlled. Denies CP/palp/SOB/HA/congestion/fevers/GI or GU c/o. Taking meds as prescribed  Past Medical History:  Diagnosis Date  . BCC (basal cell carcinoma), face 12/05/2014   Removed from right side of nose [facility] Dr Altamese Cabal and Dr Ferdinand Lango  . Cancer (HCC)    BCC right nose  . Diabetes mellitus   . H/O: stroke 09/11/2011   September 2012 acute right retinal artery occlusion H/o left retinal artery embolic event by imaging H/o blood clot in port during chemotherapy   . Multiple thyroid nodules 06/13/2012  . 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 Other and unspecified hyperlipidemia 12/16/2012  . Preventative health care 06/21/2013  . Rosacea   . Valvular heart disease 06/13/2012   Cardiologist Dr Darral Dash of [facility] in [facility]    Past Surgical History:  Procedure Laterality Date  . ABDOMINAL HYSTERECTOMY  2009   total  . COLECTOMY  2009  . MOHS SURGERY Right    for [facility] on right nose, Feb 2016  . TONGUE BIOPSY  01/05/15   negative results  . VENTRAL HERNIA REPAIR  11/11   4 lesions found, mesh left in place    Family History  Problem Relation Age of Onset  . Transient ischemic attack Mother   . Hypertension Mother   . Dementia Mother   . Diabetes Father        borderline sugar  . Coronary artery disease Father   . Heart disease Maternal Grandfather   . Heart disease Paternal Grandfather     Social History   Socioeconomic History  . Marital status: Married    Spouse name: Not on file  . Number of children: Not on file  . Years of education: Not on file  . Highest education level: Not on  file  Occupational History  . Not on file  Tobacco Use  . Smoking status: Never Smoker  . Smokeless tobacco: Never Used  Substance and Sexual Activity  . Alcohol use: Yes    Alcohol/week: 0.0 standard drinks    Comment: rare, social  . Drug use: No  . Sexual activity: Yes  Other Topics Concern  . Not on file  Social History Narrative  . Not on file   Social Determinants of Health   Financial Resource Strain:   . Difficulty of Paying Living Expenses: Not on file  Food Insecurity:   . Worried About Charity fundraiser in the Last Year: Not on file  . Ran Out of Food in the Last Year: Not on file  Transportation Needs:   . Lack of Transportation (Medical): Not on file  . Lack of Transportation (Non-Medical): Not on file  Physical Activity:   . Days of Exercise per Week: Not on file  . Minutes of Exercise per Session: Not on file  Stress:   . Feeling of Stress : Not on file  Social Connections:   . Frequency of Communication with Friends and Family: Not on file  . Frequency of Social Gatherings with Friends and Family: Not on file  . Attends Religious Services:  Not on file  . Active Member of Clubs or Organizations: Not on file  . Attends Archivist Meetings: Not on file  . Marital Status: Not on file  Intimate Partner Violence:   . Fear of Current or Ex-Partner: Not on file  . Emotionally Abused: Not on file  . Physically Abused: Not on file  . Sexually Abused: Not on file    Outpatient Medications Prior to Visit  Medication Sig Dispense Refill  . aspirin 81 MG tablet Take 81 mg by mouth daily.    . B Complex Vitamins (VITAMIN-B COMPLEX PO) Take 1 tablet by mouth daily. Reported on 12/22/2015    . blood glucose meter kit and supplies Dispense based on patient and insurance preference. Use up to rwo times daily as directed. DX: E11.9). Aviva Plus meter 1 each 0  . glucose blood (ACCU-CHEK AVIVA PLUS) test strip Check blood sugars twice daily 100 each 12  .  hydrochlorothiazide (HYDRODIURIL) 25 MG tablet TAKE 1 TABLET EVERY DAY 90 tablet 1  . latanoprost (XALATAN) 0.005 % ophthalmic solution Place 1 drop into both eyes daily.  3  . lisinopril (ZESTRIL) 20 MG tablet TAKE 1 TABLET EVERY DAY 90 tablet 1  . metFORMIN (GLUCOPHAGE) 500 MG tablet Take 1 tablet (500 mg total) by mouth 2 (two) times daily with a meal. 180 tablet 1  . OVER THE COUNTER MEDICATION Vita Fusion Fiber- 2 daily    . rosuvastatin (CRESTOR) 10 MG tablet TAKE 1 TABLET EVERY DAY 90 tablet 1   No facility-administered medications prior to visit.    Allergies  Allergen Reactions  . Codeine Nausea And Vomiting    Review of Systems  Constitutional: Negative for fever.  HENT: Negative for congestion.   Eyes: Negative for blurred vision.  Respiratory: Negative for cough.   Cardiovascular: Negative for chest pain and palpitations.  Gastrointestinal: Negative for vomiting.  Musculoskeletal: Negative for back pain.  Skin: Negative for rash.  Neurological: Negative for loss of consciousness and headaches.       Objective:    Physical Exam Vitals and nursing note reviewed.  Constitutional:      General: She is not in acute distress.    Appearance: She is well-developed.  HENT:     Head: Normocephalic and atraumatic.     Nose: Nose normal.  Eyes:     General:        Right eye: No discharge.        Left eye: No discharge.  Cardiovascular:     Rate and Rhythm: Normal rate and regular rhythm.     Heart sounds: No murmur heard.   Pulmonary:     Effort: Pulmonary effort is normal.     Breath sounds: Normal breath sounds.  Abdominal:     General: Bowel sounds are normal.     Palpations: Abdomen is soft.     Tenderness: There is no abdominal tenderness.  Musculoskeletal:     Cervical back: Normal range of motion and neck supple.  Skin:    General: Skin is warm and dry.  Neurological:     Mental Status: She is alert and oriented to person, place, and time.     BP  110/72 (BP Location: Right Arm, Patient Position: Sitting, Cuff Size: Normal)   Pulse 62   Temp (!) 97 F (36.1 C) (Temporal)   Wt 158 lb 6.4 oz (71.8 kg)   SpO2 97%   BMI 28.97 kg/m  Wt Readings from Last 3  Encounters:  05/31/20 158 lb 6.4 oz (71.8 kg)  01/19/20 157 lb (71.2 kg)  05/19/19 156 lb 6.4 oz (70.9 kg)    Diabetic Foot Exam - Simple   No data filed     Lab Results  Component Value Date   WBC 6.1 05/25/2020   HGB 14.2 05/25/2020   HCT 41.6 05/25/2020   PLT 222 05/25/2020   GLUCOSE 126 (H) 05/25/2020   CHOL 135 05/25/2020   TRIG 117 05/25/2020   HDL 54 05/25/2020   LDLDIRECT 182.4 09/11/2011   LDLCALC 61 05/25/2020   ALT 14 05/25/2020   AST 19 05/25/2020   NA 135 05/25/2020   K 4.2 05/25/2020   CL 97 (L) 05/25/2020   CREATININE 0.74 05/25/2020   BUN 15 05/25/2020   CO2 28 05/25/2020   TSH 3.41 05/25/2020   INR 0.9 09/11/2011   HGBA1C 6.8 (H) 05/25/2020    Lab Results  Component Value Date   TSH 3.41 05/25/2020   Lab Results  Component Value Date   WBC 6.1 05/25/2020   HGB 14.2 05/25/2020   HCT 41.6 05/25/2020   MCV 87.4 05/25/2020   PLT 222 05/25/2020   Lab Results  Component Value Date   NA 135 05/25/2020   K 4.2 05/25/2020   CO2 28 05/25/2020   GLUCOSE 126 (H) 05/25/2020   BUN 15 05/25/2020   CREATININE 0.74 05/25/2020   BILITOT 0.5 05/25/2020   ALKPHOS 66 01/15/2020   AST 19 05/25/2020   ALT 14 05/25/2020   PROT 6.7 05/25/2020   ALBUMIN 4.4 01/15/2020   CALCIUM 9.4 05/25/2020   GFR 69.97 01/15/2020   Lab Results  Component Value Date   CHOL 135 05/25/2020   Lab Results  Component Value Date   HDL 54 05/25/2020   Lab Results  Component Value Date   LDLCALC 61 05/25/2020   Lab Results  Component Value Date   TRIG 117 05/25/2020   Lab Results  Component Value Date   CHOLHDL 2.5 05/25/2020   Lab Results  Component Value Date   HGBA1C 6.8 (H) 05/25/2020       Assessment & Plan:   Problem List Items Addressed  This Visit    DM (diabetes mellitus), type 2 with neurological complications (Rafael Gonzalez)    [id number] acceptable, minimize simple carbs. Increase exercise as tolerated. Continue current meds      ESSENTIAL HYPERTENSION, BENIGN    Well controlled, no changes to meds. Encouraged heart healthy diet such as the DASH diet and exercise as tolerated.       Relevant Orders   CBC   Comprehensive metabolic panel   TSH   Multiple thyroid nodules   Hyperlipidemia    Encouraged heart healthy diet, increase exercise, avoid trans fats, consider a krill oil cap daily, tolerating statins      Relevant Orders   Lipid panel    Other Visit Diagnoses    Influenza vaccine administered    -  Primary   Relevant Orders   Flu Vaccine QUAD High Dose(Fluad) (Completed)   Hyperglycemia       Relevant Orders   Hemoglobin A1c      I am having Andrea Calderon. Marcom maintain her B Complex Vitamins (VITAMIN-B COMPLEX PO), aspirin, OVER THE COUNTER MEDICATION, latanoprost, glucose blood, blood glucose meter kit and supplies, rosuvastatin, lisinopril, hydrochlorothiazide, and metFORMIN.  No orders of the defined types were placed in this encounter.    Penni Homans, MD

## 2020-06-01 NOTE — Assessment & Plan Note (Signed)
Well controlled, no changes to meds. Encouraged heart healthy diet such as the DASH diet and exercise as tolerated.  °

## 2020-06-01 NOTE — Assessment & Plan Note (Signed)
Encouraged heart healthy diet, increase exercise, avoid trans fats, consider a krill oil cap daily, tolerating statins. 

## 2020-06-06 ENCOUNTER — Encounter: Payer: Self-pay | Admitting: Family Medicine

## 2020-06-06 MED ORDER — METFORMIN HCL 500 MG PO TABS
500.0000 mg | ORAL_TABLET | Freq: Two times a day (BID) | ORAL | 1 refills | Status: DC
Start: 2020-06-06 — End: 2020-12-26

## 2020-06-13 LAB — HM DIABETES EYE EXAM

## 2020-07-20 ENCOUNTER — Other Ambulatory Visit: Payer: Self-pay | Admitting: Family Medicine

## 2020-09-19 LAB — HM DIABETES EYE EXAM

## 2020-11-22 ENCOUNTER — Other Ambulatory Visit: Payer: Self-pay

## 2020-11-22 ENCOUNTER — Other Ambulatory Visit (INDEPENDENT_AMBULATORY_CARE_PROVIDER_SITE_OTHER): Payer: Medicare Other

## 2020-11-22 DIAGNOSIS — R739 Hyperglycemia, unspecified: Secondary | ICD-10-CM

## 2020-11-22 DIAGNOSIS — I10 Essential (primary) hypertension: Secondary | ICD-10-CM

## 2020-11-22 DIAGNOSIS — E785 Hyperlipidemia, unspecified: Secondary | ICD-10-CM | POA: Diagnosis not present

## 2020-11-22 LAB — CBC
HCT: 40.5 % (ref 36.0–46.0)
Hemoglobin: 13.4 g/dL (ref 12.0–15.0)
MCHC: 33 g/dL (ref 30.0–36.0)
MCV: 83.7 fl (ref 78.0–100.0)
Platelets: 230 10*3/uL (ref 150.0–400.0)
RBC: 4.84 Mil/uL (ref 3.87–5.11)
RDW: 15.5 % (ref 11.5–15.5)
WBC: 7.5 10*3/uL (ref 4.0–10.5)

## 2020-11-22 LAB — COMPREHENSIVE METABOLIC PANEL
ALT: 19 U/L (ref 0–35)
AST: 24 U/L (ref 0–37)
Albumin: 4.1 g/dL (ref 3.5–5.2)
Alkaline Phosphatase: 66 U/L (ref 39–117)
BUN: 22 mg/dL (ref 6–23)
CO2: 30 mEq/L (ref 19–32)
Calcium: 9.6 mg/dL (ref 8.4–10.5)
Chloride: 97 mEq/L (ref 96–112)
Creatinine, Ser: 0.73 mg/dL (ref 0.40–1.20)
GFR: 80.35 mL/min (ref >60.00)
Glucose, Bld: 141 mg/dL — ABNORMAL HIGH (ref 70–99)
Potassium: 4.2 mEq/L (ref 3.5–5.1)
Sodium: 135 mEq/L (ref 135–145)
Total Bilirubin: 0.6 mg/dL (ref 0.2–1.2)
Total Protein: 7 g/dL (ref 6.0–8.3)

## 2020-11-22 LAB — LIPID PANEL
Cholesterol: 127 mg/dL (ref 0–200)
HDL: 49.7 mg/dL (ref >39.00)
LDL Cholesterol: 56 mg/dL (ref 0–99)
NonHDL: 77.67
Total CHOL/HDL Ratio: 3
Triglycerides: 107 mg/dL (ref 0.0–149.0)
VLDL: 21.4 mg/dL (ref 0.0–40.0)

## 2020-11-22 LAB — TSH: TSH: 3.69 u[IU]/mL (ref 0.35–4.50)

## 2020-11-22 LAB — HEMOGLOBIN A1C: Hgb A1c MFr Bld: 7.4 % — ABNORMAL HIGH (ref 4.6–6.5)

## 2020-12-01 ENCOUNTER — Encounter: Payer: Self-pay | Admitting: Family Medicine

## 2020-12-01 ENCOUNTER — Ambulatory Visit (INDEPENDENT_AMBULATORY_CARE_PROVIDER_SITE_OTHER): Payer: Medicare Other | Admitting: Family Medicine

## 2020-12-01 ENCOUNTER — Other Ambulatory Visit: Payer: Self-pay

## 2020-12-01 VITALS — BP 102/60 | HR 74 | Temp 98.0°F | Resp 16 | Wt 157.8 lb

## 2020-12-01 DIAGNOSIS — I38 Endocarditis, valve unspecified: Secondary | ICD-10-CM

## 2020-12-01 DIAGNOSIS — E1149 Type 2 diabetes mellitus with other diabetic neurological complication: Secondary | ICD-10-CM | POA: Diagnosis not present

## 2020-12-01 DIAGNOSIS — I10 Essential (primary) hypertension: Secondary | ICD-10-CM | POA: Diagnosis not present

## 2020-12-01 DIAGNOSIS — E663 Overweight: Secondary | ICD-10-CM

## 2020-12-01 DIAGNOSIS — M109 Gout, unspecified: Secondary | ICD-10-CM | POA: Diagnosis not present

## 2020-12-01 DIAGNOSIS — E785 Hyperlipidemia, unspecified: Secondary | ICD-10-CM

## 2020-12-01 MED ORDER — HYDROCORTISONE 2.5 % EX CREA: TOPICAL_CREAM | Freq: Two times a day (BID) | CUTANEOUS | 3 refills | Status: DC | PRN

## 2020-12-01 NOTE — Assessment & Plan Note (Addendum)
She had to put off her valve replacement  Due to her husband's surgery. She is hoping to do it in September or October. Her SOB is slowly progressing and she mostly feels well

## 2020-12-01 NOTE — Assessment & Plan Note (Signed)
Well controlled, no changes to meds. Encouraged heart healthy diet such as the DASH diet and exercise as tolerated.  °

## 2020-12-01 NOTE — Patient Instructions (Addendum)
Shingrix is the new shingles shot, 2 shots over 2-6 months, confirm coverage with insurance and document, then can return here for shots with nurse appt or at pharmacy.  https://www.diabeteseducator.org/docs/default-source/living-with-diabetes/conquering-the-grocery-store-v1.pdf?sfvrsn=4">  Carbohydrate Counting for Diabetes Mellitus, Adult Carbohydrate counting is a method of keeping track of how many carbohydrates you eat. Eating carbohydrates naturally increases the amount of sugar (glucose) in the blood. Counting how many carbohydrates you eat improves your blood glucose control, which helps you manage your diabetes. It is important to know how many carbohydrates you can safely have in each meal. This is different for every person. A dietitian can help you make a meal plan and calculate how many carbohydrates you should have at each meal and snack. What foods contain carbohydrates? Carbohydrates are found in the following foods:  Grains, such as breads and cereals.  Dried beans and soy products.  Starchy vegetables, such as potatoes, peas, and corn.  Fruit and fruit juices.  Milk and yogurt.  Sweets and snack foods, such as cake, cookies, candy, chips, and soft drinks.   How do I count carbohydrates in foods? There are two ways to count carbohydrates in food. You can read food labels or learn standard serving sizes of foods. You can use either of the methods or a combination of both. Using the Nutrition Facts label The Nutrition Facts list is included on the labels of almost all packaged foods and beverages in the [place] It includes:  The serving size.  Information about nutrients in each serving, including the grams (g) of carbohydrate per serving. To use the Nutrition Facts:  Decide how many servings you will have.  Multiply the number of servings by the number of carbohydrates per serving.  The resulting number is the total amount of carbohydrates that you will be  having. Learning the standard serving sizes of foods When you eat carbohydrate foods that are not packaged or do not include Nutrition Facts on the label, you need to measure the servings in order to count the amount of carbohydrates.  Measure the foods that you will eat with a food scale or measuring cup, if needed.  Decide how many standard-size servings you will eat.  Multiply the number of servings by 15. For foods that contain carbohydrates, one serving equals 15 g of carbohydrates. ? For example, if you eat 2 cups or 10 oz (300 g) of strawberries, you will have eaten 2 servings and 30 g of carbohydrates (2 servings x 15 g = 30 g).  For foods that have more than one food mixed, such as soups and casseroles, you must count the carbohydrates in each food that is included. The following list contains standard serving sizes of common carbohydrate-rich foods. Each of these servings has about 15 g of carbohydrates:  1 slice of bread.  1 six-inch (15 cm) tortilla.  ? cup or 2 oz (53 g) cooked rice or pasta.   cup or 3 oz (85 g) cooked or canned, drained and rinsed beans or lentils.   cup or 3 oz (85 g) starchy vegetable, such as peas, corn, or squash.   cup or 4 oz (120 g) hot cereal.   cup or 3 oz (85 g) boiled or mashed potatoes, or  or 3 oz (85 g) of a large baked potato.   cup or 4 fl oz (118 mL) fruit juice.  1 cup or 8 fl oz (237 mL) milk.  1 small or 4 oz (106 g) apple.   or 2 oz (  63 g) of a medium banana.  1 cup or 5 oz (150 g) strawberries.  3 cups or 1 oz (24 g) popped popcorn. What is an example of carbohydrate counting? To calculate the number of carbohydrates in this sample meal, follow the steps shown below. Sample meal  3 oz (85 g) chicken breast.  ? cup or 4 oz (106 g) brown rice.   cup or 3 oz (85 g) corn.  1 cup or 8 fl oz (237 mL) milk.  1 cup or 5 oz (150 g) strawberries with sugar-free whipped topping. Carbohydrate  calculation 1. Identify the foods that contain carbohydrates: ? Rice. ? Corn. ? Milk. ? Strawberries. 2. Calculate how many servings you have of each food: ? 2 servings rice. ? 1 serving corn. ? 1 serving milk. ? 1 serving strawberries. 3. Multiply each number of servings by 15 g: ? 2 servings rice x 15 g = 30 g. ? 1 serving corn x 15 g = 15 g. ? 1 serving milk x 15 g = 15 g. ? 1 serving strawberries x 15 g = 15 g. 4. Add together all of the amounts to find the total grams of carbohydrates eaten: ? 30 g + 15 g + 15 g + 15 g = 75 g of carbohydrates total. What are tips for following this plan? Shopping  Develop a meal plan and then make a shopping list.  Buy fresh and frozen vegetables, fresh and frozen fruit, dairy, eggs, beans, lentils, and whole grains.  Look at food labels. Choose foods that have more fiber and less sugar.  Avoid processed foods and foods with added sugars. Meal planning  Aim to have the same amount of carbohydrates at each meal and for each snack time.  Plan to have regular, balanced meals and snacks. Where to find more information  American Diabetes Association: www.diabetes.org  Centers for Disease Control and Prevention: http://www.wolf.info/ Summary  Carbohydrate counting is a method of keeping track of how many carbohydrates you eat.  Eating carbohydrates naturally increases the amount of sugar (glucose) in the blood.  Counting how many carbohydrates you eat improves your blood glucose control, which helps you manage your diabetes.  A dietitian can help you make a meal plan and calculate how many carbohydrates you should have at each meal and snack. This information is not intended to replace advice given to you by your health care provider. Make sure you discuss any questions you have with your health care provider. Document Revised: 07/23/2019 Document Reviewed: 07/24/2019 Elsevier Patient Education  2021 [employer].

## 2020-12-03 NOTE — Assessment & Plan Note (Signed)
No recent flare. Hydrate and monitor

## 2020-12-03 NOTE — Assessment & Plan Note (Addendum)
hgba1c acceptable, minimize simple carbs. Increase exercise as tolerated. Continue current meds. A1C is up some but she feels she will be able to take better care of herself over the next few months. We will retest in 3 months

## 2020-12-03 NOTE — Progress Notes (Signed)
Subjective:    Patient ID: Andrea Calderon, female    DOB: 06/06/1946, 75 y.o.   MRN: [id number]  Chief Complaint  Patient presents with  . Follow-up  . Diabetes  . Hypertension    Pt has no concerns or problems    HPI Patient is in today for follow up on chronic medical concerns including diabetes and hypertension. No recent febrile illness or hospitalizations. She has been under a great deal of stress caring for her husband after his TKR so she has not been taking as good of care of herself. She has put off her valvular surgery to take care of her husband but she hopes to proceed in the fall. She is trying to eat well but is not able to exercise due to symptoms and time constraints. Denies CP/palp/HA/congestion/fevers/GI or GU c/o. Taking meds as prescribed  Past Medical History:  Diagnosis Date  . BCC (basal cell carcinoma), face 12/05/2014   [facility] right side [facility]amese Cabal and Dr Ferdinand Lango  . Cancer (HCC)    BCC right nose  . Diabetes mellitus   . H/O: stroke 09/11/2011   September 2012 acute right retinal artery occlusion H/o left retinal artery embolic event by imaging H/o blood clot in port during chemotherapy   . Multiple thyroid nodules 06/13/2012  . Oral lichen planus 09/13/7865  . Other and unspecified hyperlipidemia 12/16/2012  . Preventative health care 06/21/2013  . Rosacea   . Valvular heart disease 06/13/2012   Cardiologist Dr Darral Dash of [facility] in [facility]    Past Surgical History:  Procedure Laterality Date  . ABDOMINAL HYSTERECTOMY  2009   total  . COLECTOMY  2009  . MOHS SURGERY Right    for [facility] on right nose, Feb 2016  . TONGUE BIOPSY  01/05/15   negative results  . VENTRAL HERNIA REPAIR  11/11   4 lesions found, mesh left in place    Family History  Problem Relation Age of Onset  . Transient ischemic attack Mother   . Hypertension Mother   . Dementia Mother   . Diabetes Father         borderline sugar  . Coronary artery disease Father   . Heart disease Maternal Grandfather   . Heart disease Paternal Grandfather     Social History   Socioeconomic History  . Marital status: Married    Spouse name: Not on file  . Number of children: Not on file  . Years of education: Not on file  . Highest education level: Not on file  Occupational History  . Not on file  Tobacco Use  . Smoking status: Never Smoker  . Smokeless tobacco: Never Used  Substance and Sexual Activity  . Alcohol use: Yes    Alcohol/week: 0.0 standard drinks    Comment: rare, social  . Drug use: No  . Sexual activity: Yes  Other Topics Concern  . Not on file  Social History Narrative  . Not on file   Social Determinants of Health   Financial Resource Strain: Not on file  Food Insecurity: Not on file  Transportation Needs: Not on file  Physical Activity: Not on file  Stress: Not on file  Social Connections: Not on file  Intimate Partner Violence: Not on file    Outpatient Medications Prior to Visit  Medication Sig Dispense Refill  . aspirin 81 MG tablet Take 81 mg by mouth daily.    Marland Kitchen  B Complex Vitamins (VITAMIN-B COMPLEX PO) Take 1 tablet by mouth daily. Reported on 12/22/2015    . blood glucose meter kit and supplies Dispense based on patient and insurance preference. Use up to rwo times daily as directed. DX: E11.9). Aviva Plus meter 1 each 0  . glucose blood (ACCU-CHEK AVIVA PLUS) test strip Check blood sugars twice daily 100 each 12  . hydrochlorothiazide (HYDRODIURIL) 25 MG tablet TAKE 1 TABLET EVERY DAY 90 tablet 1  . latanoprost (XALATAN) 0.005 % ophthalmic solution Place 1 drop into both eyes daily.  3  . lisinopril (ZESTRIL) 20 MG tablet TAKE 1 TABLET EVERY DAY 90 tablet 1  . metFORMIN (GLUCOPHAGE) 500 MG tablet Take 1 tablet (500 mg total) by mouth 2 (two) times daily with a meal. 180 tablet 1  . OVER THE COUNTER MEDICATION Vita Fusion Fiber- 2 daily    . rosuvastatin (CRESTOR) 10  MG tablet TAKE 1 TABLET EVERY DAY 90 tablet 1   No facility-administered medications prior to visit.    Allergies  Allergen Reactions  . Codeine Nausea And Vomiting    Review of Systems  Constitutional: Positive for malaise/fatigue. Negative for fever.  HENT: Negative for congestion.   Eyes: Negative for blurred vision.  Respiratory: Positive for shortness of breath.   Cardiovascular: Negative for chest pain, palpitations and leg swelling.  Gastrointestinal: Negative for abdominal pain, blood in stool and nausea.  Genitourinary: Negative for dysuria and frequency.  Musculoskeletal: Negative for falls.  Skin: Negative for rash.  Neurological: Negative for dizziness, loss of consciousness and headaches.  Endo/Heme/Allergies: Negative for environmental allergies.  Psychiatric/Behavioral: Negative for depression. The patient is nervous/anxious.        Objective:    Physical Exam Vitals and nursing note reviewed.  Constitutional:      General: She is not in acute distress.    Appearance: She is well-developed.  HENT:     Head: Normocephalic and atraumatic.     Nose: Nose normal.  Eyes:     General:        Right eye: No discharge.        Left eye: No discharge.  Cardiovascular:     Rate and Rhythm: Normal rate and regular rhythm.     Heart sounds: Murmur heard.    Pulmonary:     Effort: Pulmonary effort is normal.     Breath sounds: Normal breath sounds.  Abdominal:     General: Bowel sounds are normal.     Palpations: Abdomen is soft.     Tenderness: There is no abdominal tenderness.  Musculoskeletal:     Cervical back: Normal range of motion and neck supple.  Skin:    General: Skin is warm and dry.  Neurological:     Mental Status: She is alert and oriented to person, place, and time.     BP 102/60   Pulse 74   Temp 98 F (36.7 C)   Resp 16   Wt 157 lb 12.8 oz (71.6 kg)   SpO2 97%   BMI 28.86 kg/m  Wt Readings from Last 3 Encounters:  12/01/20 157 lb  12.8 oz (71.6 kg)  05/31/20 158 lb 6.4 oz (71.8 kg)  01/19/20 157 lb (71.2 kg)    Diabetic Foot Exam - Simple   Simple Foot Form Visual Inspection No deformities, no ulcerations, no other skin breakdown bilaterally: Yes Sensation Testing Intact to touch and monofilament testing bilaterally: Yes Pulse Check Posterior Tibialis and Dorsalis pulse intact bilaterally: Yes  Comments    Lab Results  Component Value Date   WBC 7.5 11/22/2020   HGB 13.4 11/22/2020   HCT 40.5 11/22/2020   PLT 230.0 11/22/2020   GLUCOSE 141 (H) 11/22/2020   CHOL 127 11/22/2020   TRIG 107.0 11/22/2020   HDL 49.70 11/22/2020   LDLDIRECT 182.4 09/11/2011   LDLCALC 56 11/22/2020   ALT 19 11/22/2020   AST 24 11/22/2020   NA 135 11/22/2020   K 4.2 11/22/2020   CL 97 11/22/2020   CREATININE 0.73 11/22/2020   BUN 22 11/22/2020   CO2 30 11/22/2020   TSH 3.69 11/22/2020   INR 0.9 09/11/2011   HGBA1C 7.4 (H) 11/22/2020    Lab Results  Component Value Date   TSH 3.69 11/22/2020   Lab Results  Component Value Date   WBC 7.5 11/22/2020   HGB 13.4 11/22/2020   HCT 40.5 11/22/2020   MCV 83.7 11/22/2020   PLT 230.0 11/22/2020   Lab Results  Component Value Date   NA 135 11/22/2020   K 4.2 11/22/2020   CO2 30 11/22/2020   GLUCOSE 141 (H) 11/22/2020   BUN 22 11/22/2020   CREATININE 0.73 11/22/2020   BILITOT 0.6 11/22/2020   ALKPHOS 66 11/22/2020   AST 24 11/22/2020   ALT 19 11/22/2020   PROT 7.0 11/22/2020   ALBUMIN 4.1 11/22/2020   CALCIUM 9.6 11/22/2020   GFR 80.35 11/22/2020   Lab Results  Component Value Date   CHOL 127 11/22/2020   Lab Results  Component Value Date   HDL 49.70 11/22/2020   Lab Results  Component Value Date   LDLCALC 56 11/22/2020   Lab Results  Component Value Date   TRIG 107.0 11/22/2020   Lab Results  Component Value Date   CHOLHDL 3 11/22/2020   Lab Results  Component Value Date   HGBA1C 7.4 (H) 11/22/2020       Assessment & Plan:   Problem  List Items Addressed This Visit    DM (diabetes mellitus), type 2 with neurological complications ([place]) - Primary    hgba1c acceptable, minimize simple carbs. Increase exercise as tolerated. Continue current meds. A1C is up some but she feels she will be able to take better care of herself over the next few months. We will retest in 3 months      Relevant Orders   Hemoglobin A1c   Hemoglobin A1c   GOUT, UNSPECIFIED    No recent flare. Hydrate and monitor      Relevant Orders   Uric acid   Uric acid   Overweight    Encouragedheart healthy diet, decrease po intake and increase exercise as tolerated. Needs 7-8 hours of sleep nightly. Avoid trans fats, eat small, frequent meals every 4-5 hours with lean proteins, complex carbs and healthy fats. Minimize simple carbs, she has been very busy caring for her husband after his TKR so has been less focused on eating well and moving less. She feels now she will be able to take better care of herself now that he is improving.      ESSENTIAL HYPERTENSION, BENIGN    Well controlled, no changes to meds. Encouraged heart healthy diet such as the DASH diet and exercise as tolerated.       Relevant Orders   CBC   Comprehensive metabolic panel   TSH   CBC   Comprehensive metabolic panel   TSH   Valvular heart disease    She had to put off her valve  replacement  Due to her husband's surgery. She is hoping to do it in September or October. Her SOB is slowly progressing and she mostly feels well      Hyperlipidemia   Relevant Orders   Lipid panel   Lipid panel      I am having Cena Bruhn. Gibb start on hydrocortisone. I am also having her maintain her B Complex Vitamins (VITAMIN-B COMPLEX PO), aspirin, OVER THE COUNTER MEDICATION, latanoprost, glucose blood, blood glucose meter kit and supplies, metFORMIN, lisinopril, rosuvastatin, and hydrochlorothiazide.  Meds ordered this encounter  Medications  . hydrocortisone 2.5 % cream    Sig: Apply  topically 2 (two) times daily as needed.    Dispense:  28 g    Refill:  3     Penni Homans, MD

## 2020-12-03 NOTE — Assessment & Plan Note (Addendum)
Encouragedheart healthy diet, decrease po intake and increase exercise as tolerated. Needs 7-8 hours of sleep nightly. Avoid trans fats, eat small, frequent meals every 4-5 hours with lean proteins, complex carbs and healthy fats. Minimize simple carbs, she has been very busy caring for her husband after his TKR so has been less focused on eating well and moving less. She feels now she will be able to take better care of herself now that he is improving.

## 2020-12-20 ENCOUNTER — Other Ambulatory Visit (HOSPITAL_BASED_OUTPATIENT_CLINIC_OR_DEPARTMENT_OTHER): Payer: Self-pay

## 2020-12-20 MED ORDER — ZOSTER VAC RECOMB ADJUVANTED 50 MCG/0.5ML IM SUSR
INTRAMUSCULAR | 1 refills | Status: DC
  Filled 2020-12-20: qty 1, 30d supply, fill #0

## 2020-12-25 ENCOUNTER — Other Ambulatory Visit: Payer: Self-pay | Admitting: Family Medicine

## 2021-01-13 ENCOUNTER — Other Ambulatory Visit: Payer: Self-pay | Admitting: Family Medicine

## 2021-03-02 ENCOUNTER — Other Ambulatory Visit: Payer: Medicare Other

## 2021-03-10 ENCOUNTER — Other Ambulatory Visit (HOSPITAL_BASED_OUTPATIENT_CLINIC_OR_DEPARTMENT_OTHER): Payer: Self-pay

## 2021-03-10 ENCOUNTER — Emergency Department (HOSPITAL_BASED_OUTPATIENT_CLINIC_OR_DEPARTMENT_OTHER): Payer: Medicare Other

## 2021-03-10 ENCOUNTER — Encounter (HOSPITAL_BASED_OUTPATIENT_CLINIC_OR_DEPARTMENT_OTHER): Payer: Self-pay | Admitting: *Deleted

## 2021-03-10 ENCOUNTER — Other Ambulatory Visit: Payer: Self-pay

## 2021-03-10 ENCOUNTER — Emergency Department (HOSPITAL_BASED_OUTPATIENT_CLINIC_OR_DEPARTMENT_OTHER)
Admission: EM | Admit: 2021-03-10 | Discharge: 2021-03-10 | Disposition: A | Discharge status: Home / Self Care | Payer: Medicare Other | Attending: Emergency Medicine | Admitting: Emergency Medicine

## 2021-03-10 DIAGNOSIS — Z85820 Personal history of malignant melanoma of skin: Secondary | ICD-10-CM | POA: Diagnosis not present

## 2021-03-10 DIAGNOSIS — Z79899 Other long term (current) drug therapy: Secondary | ICD-10-CM | POA: Diagnosis not present

## 2021-03-10 DIAGNOSIS — Z85038 Personal history of other malignant neoplasm of large intestine: Secondary | ICD-10-CM | POA: Insufficient documentation

## 2021-03-10 DIAGNOSIS — I10 Essential (primary) hypertension: Secondary | ICD-10-CM | POA: Insufficient documentation

## 2021-03-10 DIAGNOSIS — I4892 Unspecified atrial flutter: Secondary | ICD-10-CM

## 2021-03-10 DIAGNOSIS — Z7984 Long term (current) use of oral hypoglycemic drugs: Secondary | ICD-10-CM | POA: Diagnosis not present

## 2021-03-10 DIAGNOSIS — Z7901 Long term (current) use of anticoagulants: Secondary | ICD-10-CM | POA: Diagnosis not present

## 2021-03-10 DIAGNOSIS — R002 Palpitations: Secondary | ICD-10-CM | POA: Diagnosis present

## 2021-03-10 DIAGNOSIS — E119 Type 2 diabetes mellitus without complications: Secondary | ICD-10-CM | POA: Insufficient documentation

## 2021-03-10 DIAGNOSIS — Z20822 Contact with and (suspected) exposure to covid-19: Secondary | ICD-10-CM | POA: Insufficient documentation

## 2021-03-10 LAB — CBC WITH DIFFERENTIAL/PLATELET
Abs Immature Granulocytes: 0.02 10*3/uL (ref 0.00–0.07)
Basophils Absolute: 0.1 10*3/uL (ref 0.0–0.1)
Basophils Relative: 1 %
Eosinophils Absolute: 0.1 10*3/uL (ref 0.0–0.5)
Eosinophils Relative: 1 %
HCT: 41.2 % (ref 36.0–46.0)
Hemoglobin: 13.8 g/dL (ref 12.0–15.0)
Immature Granulocytes: 0 %
Lymphocytes Relative: 29 %
Lymphs Abs: 2.6 10*3/uL (ref 0.7–4.0)
MCH: 27.1 pg (ref 26.0–34.0)
MCHC: 33.5 g/dL (ref 30.0–36.0)
MCV: 80.8 fL (ref 80.0–100.0)
Monocytes Absolute: 0.6 10*3/uL (ref 0.1–1.0)
Monocytes Relative: 7 %
Neutro Abs: 5.4 10*3/uL (ref 1.7–7.7)
Neutrophils Relative %: 62 %
Platelets: 257 10*3/uL (ref 150–400)
RBC: 5.1 MIL/uL (ref 3.87–5.11)
RDW: 14.6 % (ref 11.5–15.5)
WBC: 8.7 10*3/uL (ref 4.0–10.5)
nRBC: 0 % (ref 0.0–0.2)

## 2021-03-10 LAB — RESP PANEL BY RT-PCR (FLU A&B, COVID) ARPGX2
Influenza A by PCR: NEGATIVE
Influenza B by PCR: NEGATIVE
SARS Coronavirus 2 by RT PCR: NEGATIVE

## 2021-03-10 LAB — BASIC METABOLIC PANEL
Anion gap: 11 (ref 5–15)
BUN: 21 mg/dL (ref 8–23)
CO2: 26 mmol/L (ref 22–32)
Calcium: 9.4 mg/dL (ref 8.9–10.3)
Chloride: 95 mmol/L — ABNORMAL LOW (ref 98–111)
Creatinine, Ser: 0.8 mg/dL (ref 0.44–1.00)
GFR, Estimated: >60 mL/min (ref >60)
Glucose, Bld: 124 mg/dL — ABNORMAL HIGH (ref 70–99)
Potassium: 3.9 mmol/L (ref 3.5–5.1)
Sodium: 132 mmol/L — ABNORMAL LOW (ref 135–145)

## 2021-03-10 LAB — TSH: TSH: 2.089 u[IU]/mL (ref 0.350–4.500)

## 2021-03-10 LAB — MAGNESIUM: Magnesium: 1.8 mg/dL (ref 1.7–2.4)

## 2021-03-10 LAB — TROPONIN I (HIGH SENSITIVITY): Troponin I (High Sensitivity): 21 ng/L — ABNORMAL HIGH (ref <18)

## 2021-03-10 LAB — T4, FREE: Free T4: 1.07 ng/dL (ref 0.61–1.12)

## 2021-03-10 MED ORDER — APIXABAN 2.5 MG PO TABS
5.0000 mg | ORAL_TABLET | Freq: Once | ORAL | Status: AC
  Administered 2021-03-10: 5 mg via ORAL
  Filled 2021-03-10: qty 2

## 2021-03-10 MED ORDER — DILTIAZEM HCL-DEXTROSE 125-5 MG/125ML-% IV SOLN (PREMIX): 5.0000 mg/h | INTRAVENOUS | Status: DC

## 2021-03-10 MED ORDER — METOPROLOL SUCCINATE ER 25 MG PO TB24
12.5000 mg | ORAL_TABLET | Freq: Once | ORAL | Status: AC
  Administered 2021-03-10: 12.5 mg via ORAL

## 2021-03-10 MED ORDER — SODIUM CHLORIDE 0.9 % IV BOLUS
250.0000 mL | Freq: Once | INTRAVENOUS | Status: AC
  Administered 2021-03-10: 250 mL via INTRAVENOUS

## 2021-03-10 MED ORDER — DILTIAZEM HCL 25 MG/5ML IV SOLN
15.0000 mg | Freq: Once | INTRAVENOUS | Status: DC
  Filled 2021-03-10: qty 5

## 2021-03-10 MED ORDER — METOPROLOL SUCCINATE ER 25 MG PO TB24
12.5000 mg | ORAL_TABLET | Freq: Every day | ORAL | 1 refills | Status: DC
  Filled 2021-03-10: qty 15, 30d supply, fill #0

## 2021-03-10 MED ORDER — APIXABAN 5 MG PO TABS
5.0000 mg | ORAL_TABLET | Freq: Two times a day (BID) | ORAL | 0 refills | Status: DC
  Filled 2021-03-10: qty 60, 30d supply, fill #0

## 2021-03-10 MED ORDER — APIXABAN (ELIQUIS) EDUCATION KIT FOR DVT/PE PATIENTS: PACK | Freq: Once | Status: AC

## 2021-03-10 NOTE — ED Provider Notes (Signed)
[place] EMERGENCY DEPARTMENT Provider Note   CSN: [id number] Arrival date & time: 03/10/21  1255     History Chief Complaint  Patient presents with   Tachycardia    Andrea Calderon is a 75 y.o. female.  The history is provided by the patient.  Palpitations Palpitations quality:  Fast Onset quality:  Sudden Duration:  7 hours Timing:  Constant Progression:  Unchanged Chronicity:  New Relieved by:  Nothing Worsened by:  Nothing Associated symptoms: no back pain, no chest pain, no chest pressure, no cough, no dizziness, no leg pain, no lower extremity edema, no malaise/fatigue, no nausea, no near-syncope, no orthopnea, no shortness of breath and no vomiting   Risk factors comment:  Severe AS     Past Medical History:  Diagnosis Date   BCC (basal cell carcinoma), face 12/05/2014   Removed from right side [facility]amese Cabal and Dr Ferdinand Lango   Cancer [facility])    [facility] right nose   Diabetes mellitus    H/O: stroke 09/11/2011   September 2012 acute right retinal artery occlusion H/o left retinal artery embolic event by imaging H/o blood clot in port during chemotherapy    Multiple thyroid nodules 94/03/164   Oral lichen planus 12/06/7480   Other and unspecified hyperlipidemia 12/16/2012   Preventative health care 06/21/2013   Rosacea    Valvular heart disease 06/13/2012   Cardiologist Dr Darral Dash of [facility] in [facility]    Patient Active Problem List   Diagnosis Date Noted   Cough 05/20/2019   BCC (basal cell carcinoma), face 12/05/2014   Medicare annual wellness visit, subsequent 05/21/2014   Hyperlipidemia 12/16/2012   Valvular heart disease 06/13/2012   Multiple thyroid nodules 06/13/2012   H/O: stroke 70/78/6754   Oral lichen planus 49/20/1007   Malignant neoplasm of colon ([place]) 09/01/2010   DM (diabetes mellitus), type 2 with neurological complications ([place]) 07/25/7587   GOUT, UNSPECIFIED 09/01/2010    Overweight 09/01/2010   ESSENTIAL HYPERTENSION, BENIGN 09/01/2010   ABDOMINAL WALL HERNIA 09/01/2010   OTHER AND UNSPECIFIED OVARIAN CYST 09/01/2010   FACIAL PARESTHESIA 09/01/2010   ARRHYTHMIA, HX OF 09/01/2010   OTH SPEC PERS HX PRESENTING HAZARDS HEALTH OTH 09/01/2010    Past Surgical History:  Procedure Laterality Date   ABDOMINAL HYSTERECTOMY  2009   total   COLECTOMY  2009   MOHS SURGERY Right    for [facility] on right nose, Feb 2016   TONGUE BIOPSY  01/05/15   negative results   VENTRAL HERNIA REPAIR  11/11   4 lesions found, mesh left in place     OB History   No obstetric history on file.     Family History  Problem Relation Age of Onset   Transient ischemic attack Mother    Hypertension Mother    Dementia Mother    Diabetes Father        borderline sugar   Coronary artery disease Father    Heart disease Maternal Grandfather    Heart disease Paternal Grandfather     Social History   Tobacco Use   Smoking status: Never   Smokeless tobacco: Never  Vaping Use   Vaping Use: Never used  Substance Use Topics   Alcohol use: Not Currently    Comment: rare, social   Drug use: No    Home Medications Prior to Admission medications   Medication Sig Start Date End Date Taking? Authorizing Provider  apixaban (  ELIQUIS) 5 MG TABS tablet Take 1 tablet (5 mg total) by mouth 2 (two) times daily. 03/10/21 04/09/21 Yes Treina Arscott, DO  metoprolol succinate (TOPROL-XL) 25 MG 24 hr tablet Take 1/2 tablet (12.5 mg total) by mouth daily. 03/10/21 05/09/21 Yes Sruti Ayllon, DO  B Complex Vitamins (VITAMIN-B COMPLEX PO) Take 1 tablet by mouth daily. Reported on 12/22/2015    [provider]  blood glucose meter kit and supplies Dispense based on patient and insurance preference. Use up to rwo times daily as directed. DX: E11.9). Aviva Plus meter 12/24/17   [name], MD  glucose blood (ACCU-CHEK AVIVA PLUS) test strip Check blood sugars twice daily 12/24/17   [name], MD  hydrochlorothiazide (HYDRODIURIL) 25 MG tablet TAKE 1 TABLET BY MOUTH EVERY DAY 01/13/21   [name], MD  hydrocortisone 2.5 % cream Apply topically 2 (two) times daily as needed. 12/01/20   [name], MD  latanoprost (XALATAN) 0.005 % ophthalmic solution Place 1 drop into both eyes daily. 11/20/15   [provider]  lisinopril (ZESTRIL) 20 MG tablet TAKE 1 TABLET BY MOUTH EVERY DAY 01/13/21   [name], MD  metFORMIN (GLUCOPHAGE) 500 MG tablet TAKE 1 TABLET BY MOUTH 2 TIMES DAILY WITH A MEAL. 12/26/20   [name], MD  OVER THE COUNTER MEDICATION Vita Fusion Fiber- 2 daily    [provider]  rosuvastatin (CRESTOR) 10 MG tablet TAKE 1 TABLET BY MOUTH EVERY DAY 01/13/21   [name], MD  Zoster Vaccine Adjuvanted [facility]) injection Inject into the muscle. 12/20/20   Carlyle Basques, MD    Allergies    Codeine  Review of Systems   Review of Systems  Constitutional:  Negative for chills, fever and malaise/fatigue.  HENT:  Negative for ear pain and sore throat.   Eyes:  Negative for pain and visual disturbance.  Respiratory:  Negative for cough and shortness of breath.   Cardiovascular:  Positive for palpitations. Negative for chest pain, orthopnea and near-syncope.  Gastrointestinal:  Negative for abdominal pain, nausea and vomiting.  Genitourinary:  Negative for dysuria and hematuria.  Musculoskeletal:  Negative for arthralgias and back pain.  Skin:  Negative for color change and rash.  Neurological:  Negative for dizziness, seizures and syncope.  All other systems reviewed and are negative.  Physical Exam Updated Vital Signs BP 103/63 (BP Location: Right Arm)   Pulse 69   Temp 97.9 F (36.6 C) (Oral)   Resp 17   Ht $R'5\' 2"'UF$  (1.575 m)   Wt 70.3 kg   SpO2 99%   BMI 28.35 kg/m   Physical Exam Vitals and nursing note reviewed.  Constitutional:      General: She is not in acute distress.    Appearance: She is well-developed. She is  not ill-appearing.  HENT:     Head: Normocephalic and atraumatic.     Nose: Nose normal.     Mouth/Throat:     Mouth: Mucous membranes are moist.  Eyes:     Extraocular Movements: Extraocular movements intact.     Conjunctiva/sclera: Conjunctivae normal.     Pupils: Pupils are equal, round, and reactive to light.  Cardiovascular:     Rate and Rhythm: Regular rhythm. Tachycardia present.     Pulses: Normal pulses.     Heart sounds: Normal heart sounds. No murmur heard. Pulmonary:     Effort: Pulmonary effort is normal. No respiratory distress.     Breath sounds:  Normal breath sounds.  Abdominal:     Palpations: Abdomen is soft.     Tenderness: There is no abdominal tenderness.  Musculoskeletal:     Cervical back: Neck supple.  Skin:    General: Skin is warm and dry.     Capillary Refill: Capillary refill takes less than 2 seconds.  Neurological:     General: No focal deficit present.     Mental Status: She is alert and oriented to person, place, and time.     Cranial Nerves: No cranial nerve deficit.     Sensory: No sensory deficit.     Motor: No weakness.     Coordination: Coordination normal.    ED Results / Procedures / Treatments   Labs (all labs ordered are listed, but only abnormal results are displayed) Labs Reviewed  BASIC METABOLIC PANEL - Abnormal; Notable for the following components:      Result Value   Sodium 132 (*)    Chloride 95 (*)    Glucose, Bld 124 (*)    All other components within normal limits  TROPONIN I (HIGH SENSITIVITY) - Abnormal; Notable for the following components:   Troponin I (High Sensitivity) 21 (*)    All other components within normal limits  RESP PANEL BY RT-PCR (FLU A&B, COVID) ARPGX2  CBC WITH DIFFERENTIAL/PLATELET  MAGNESIUM  TSH  T4, FREE    EKG EKG Interpretation  Date/Time:  Friday March 10 2021 13:56:31 EDT Ventricular Rate:  76 PR Interval:  171 QRS Duration: 136 QT Interval:  413 QTC Calculation: 465 R  Axis:   77 Text Interpretation: Sinus rhythm LVH with IVCD and secondary repol abnrm ST depr, consider ischemia, inferior leads Confirmed by Lennice Sites ([id number]) on 03/10/2021 2:21:56 PM Also confirmed by Lennice Sites [phone]), editor Mariam Dollar [phone])  on 03/11/2021 9:50:04 AM  Radiology DG Chest Portable 1 View  Result Date: 03/10/2021 CLINICAL DATA:  Tachycardia. EXAM: PORTABLE CHEST 1 VIEW COMPARISON:  03/14/2013 FINDINGS: The cardiac silhouette, mediastinal and hilar contours are within normal limits given the AP projection and portable technique. The lungs are clear.  No pleural effusion or pulmonary lesions. The bony thorax is intact. 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 IMPRESSION: No acute cardiopulmonary findings. Electronically Signed   By: Marijo Sanes M.D.   On: 03/10/2021 14:13    Procedures Procedures   Medications Ordered in ED Medications  sodium chloride 0.9 % bolus 250 mL (0 mLs Intravenous Stopped 03/10/21 1442)  metoprolol succinate (TOPROL-XL) 24 hr tablet 12.5 mg (12.5 mg Oral Given 03/10/21 1508)  apixaban (ELIQUIS) tablet 5 mg (5 mg Oral Given 03/10/21 1507)  apixaban (ELIQUIS) Education Kit for DVT/PE patients ( Does not apply Given 03/10/21 1509)    ED Course  I have reviewed the triage vital signs and the nursing notes.  Pertinent labs & imaging results that were available during my care of the patient were reviewed by me and considered in my medical decision making (see chart for details).    MDM Rules/Calculators/A&P                           AJA WHITEHAIR is a 75 year old female with history of diabetes, cancer who presents the ED with palpitations.  Patient found to have tachycardia in the 150s upon arrival.  EKG appeared to be consistent with an atrial flutter.  This resolved on its own to sinus rhythm without any medication.  Sinus rhythm  in the 70s afterwards but ST depressions diffusely.  However upon chart review ST depressions diffusely have been on prior  EKG in 2011.  Follows with cardiology at [facility].  Supposedly upon chart review she has had unremarkable heart cath last year.  She has severe aortic stenosis and is supposed to have TAVR next month.  She has an appointment with them on Monday.  No history of A. fib or a flutter.  Is on a daily aspirin but not on any blood thinners.  Talked with cardiology at [facility] who agrees with my assessment of the EKGs.  Checking basic labs including troponin but she is not having any chest pain but did have some chest tightness when palpitations for started this morning.  We will try to discuss with her cardiologist given that she is supposed to have major surgery here pretty soon about treatment plan/anticoagulation goal.  Overall lab work is unremarkable.  No significant anemia, electrolyte abnormality, kidney injury.  Troponin overall unremarkable at 21.  EKG seems to be at baseline.  Heart cath about a year ago that showed nonobstructive disease.  Talked with Dr. Orlinda Blalock with her cardiology group who reviewed case with me.  Overall she has follow-up with them on Monday.  We will start her on Lopressor 12.5 mg daily as well as Eliquis.  CHA2DS2-VASc score is 4.  She is educated about these medications and discharged in ED in good condition.  CHA2DS2-VASc Score = 4  The patient's score is based upon: CHF History: No HTN History: No Diabetes History: Yes Stroke History: No Vascular Disease History: No Age Score: 2 Gender Score: 1     ASSESSMENT AND PLAN: Atrial flutter with rvr  Signed,  Lennice Sites, DO    03/11/2021 3:17 PM     This chart was dictated using voice recognition software.  Despite best efforts to proofread,  errors can occur which can change the documentation meaning.   Final Clinical Impression(s) / ED Diagnoses Final diagnoses:  Atrial flutter, unspecified type ([place])    Rx / DC Orders ED Discharge Orders          Ordered    apixaban (ELIQUIS) 5 MG TABS  tablet  2 times daily        03/10/21 1431    metoprolol succinate (TOPROL-XL) 25 MG 24 hr tablet  Daily        03/10/21 1431             Kane, DO 03/11/21 1517

## 2021-03-10 NOTE — ED Notes (Signed)
Paged Dr. Oval Linsey

## 2021-03-10 NOTE — ED Triage Notes (Signed)
Rapid heart beat this am. States she is waiting to have an aortic valve replacement.

## 2021-03-10 NOTE — Discharge Instructions (Addendum)
Start taking metoprolol for tachycardia as discussed.  You have also been started on a blood thinner called Eliquis.  Follow-up with cardiology as previously scheduled. -----------------------------------------------------------------------------  Information on my medicine - ELIQUIS (apixaban)  This medication education was reviewed with me or my healthcare representative as part of my discharge preparation.  Why was Eliquis prescribed for you? Eliquis was prescribed for you to reduce the risk of forming blood clots that can cause a stroke if you have a medical condition called atrial fibrillation (a type of irregular heartbeat).  What do You need to know about Eliquis ? Take your Eliquis TWICE DAILY - one tablet in the morning and one tablet in the evening with or without food.  It would be best to take the doses about the same time each day.  If you have difficulty swallowing the tablet whole please discuss with your pharmacist how to take the medication safely.  Take Eliquis exactly as prescribed by your doctor and DO NOT stop taking Eliquis without talking to the doctor who prescribed the medication.  Stopping may increase your risk of developing a new clot or stroke.  Refill your prescription before you run out.  After discharge, you should have regular check-up appointments with your healthcare provider that is prescribing your Eliquis.  In the future your dose may need to be changed if your kidney function or weight changes by a significant amount or as you get older.  What do you do if you miss a dose? If you miss a dose, take it as soon as you remember on the same day and resume taking twice daily.  Do not take more than one dose of ELIQUIS at the same time.  Important Safety Information A possible side effect of Eliquis is bleeding. You should call your healthcare provider right away if you experience any of the following: Bleeding from an injury or your nose that does not  stop. Unusual colored urine (red or dark brown) or unusual colored stools (red or black). Unusual bruising for unknown reasons. A serious fall or if you hit your head (even if there is no bleeding).  Some medicines may interact with Eliquis and might increase your risk of bleeding or clotting while on Eliquis. To help avoid this, consult your healthcare provider or pharmacist prior to using any new prescription or non-prescription medications, including herbals, vitamins, non-steroidal anti-inflammatory drugs (NSAIDs) and supplements.  This website has more information on Eliquis (apixaban): [email or web address].

## 2021-03-10 NOTE — ED Notes (Signed)
Pt HR slowed into 80's and became regular, p waves can be seen.  EKG done and showed to EDP. Held cardizem at this time

## 2021-03-17 ENCOUNTER — Other Ambulatory Visit (INDEPENDENT_AMBULATORY_CARE_PROVIDER_SITE_OTHER): Payer: Medicare Other

## 2021-03-17 ENCOUNTER — Other Ambulatory Visit: Payer: Self-pay

## 2021-03-17 DIAGNOSIS — E785 Hyperlipidemia, unspecified: Secondary | ICD-10-CM

## 2021-03-17 DIAGNOSIS — E1149 Type 2 diabetes mellitus with other diabetic neurological complication: Secondary | ICD-10-CM

## 2021-03-17 DIAGNOSIS — I10 Essential (primary) hypertension: Secondary | ICD-10-CM

## 2021-03-17 DIAGNOSIS — M109 Gout, unspecified: Secondary | ICD-10-CM | POA: Diagnosis not present

## 2021-03-17 LAB — CBC
HCT: 40.6 % (ref 36.0–46.0)
Hemoglobin: 13.2 g/dL (ref 12.0–15.0)
MCHC: 32.4 g/dL (ref 30.0–36.0)
MCV: 82.8 fl (ref 78.0–100.0)
Platelets: 237 10*3/uL (ref 150.0–400.0)
RBC: 4.91 Mil/uL (ref 3.87–5.11)
RDW: 15.7 % — ABNORMAL HIGH (ref 11.5–15.5)
WBC: 7.7 10*3/uL (ref 4.0–10.5)

## 2021-03-17 LAB — LIPID PANEL
Cholesterol: 131 mg/dL (ref 0–200)
HDL: 52.4 mg/dL (ref >39.00)
LDL Cholesterol: 52 mg/dL (ref 0–99)
NonHDL: 79.01
Total CHOL/HDL Ratio: 3
Triglycerides: 135 mg/dL (ref 0.0–149.0)
VLDL: 27 mg/dL (ref 0.0–40.0)

## 2021-03-17 LAB — COMPREHENSIVE METABOLIC PANEL
ALT: 12 U/L (ref 0–35)
AST: 18 U/L (ref 0–37)
Albumin: 4.3 g/dL (ref 3.5–5.2)
Alkaline Phosphatase: 59 U/L (ref 39–117)
BUN: 18 mg/dL (ref 6–23)
CO2: 30 mEq/L (ref 19–32)
Calcium: 9.5 mg/dL (ref 8.4–10.5)
Chloride: 98 mEq/L (ref 96–112)
Creatinine, Ser: 0.84 mg/dL (ref 0.40–1.20)
GFR: 67.74 mL/min (ref >60.00)
Glucose, Bld: 131 mg/dL — ABNORMAL HIGH (ref 70–99)
Potassium: 4.5 mEq/L (ref 3.5–5.1)
Sodium: 135 mEq/L (ref 135–145)
Total Bilirubin: 0.4 mg/dL (ref 0.2–1.2)
Total Protein: 7 g/dL (ref 6.0–8.3)

## 2021-03-17 LAB — HEMOGLOBIN A1C: Hgb A1c MFr Bld: 7.4 % — ABNORMAL HIGH (ref 4.6–6.5)

## 2021-03-17 LAB — URIC ACID: Uric Acid, Serum: 7.3 mg/dL — ABNORMAL HIGH (ref 2.4–7.0)

## 2021-03-17 LAB — TSH: TSH: 3.83 u[IU]/mL (ref 0.35–5.50)

## 2021-03-29 ENCOUNTER — Other Ambulatory Visit (HOSPITAL_BASED_OUTPATIENT_CLINIC_OR_DEPARTMENT_OTHER): Payer: Self-pay

## 2021-03-30 ENCOUNTER — Encounter (HOSPITAL_BASED_OUTPATIENT_CLINIC_OR_DEPARTMENT_OTHER): Payer: Self-pay | Admitting: *Deleted

## 2021-03-30 ENCOUNTER — Emergency Department (HOSPITAL_BASED_OUTPATIENT_CLINIC_OR_DEPARTMENT_OTHER)
Admission: EM | Admit: 2021-03-30 | Discharge: 2021-03-30 | Disposition: A | Discharge status: Home / Self Care | Payer: Medicare Other | Attending: Emergency Medicine | Admitting: Emergency Medicine

## 2021-03-30 ENCOUNTER — Other Ambulatory Visit: Payer: Self-pay

## 2021-03-30 ENCOUNTER — Emergency Department (HOSPITAL_BASED_OUTPATIENT_CLINIC_OR_DEPARTMENT_OTHER): Payer: Medicare Other

## 2021-03-30 DIAGNOSIS — R0981 Nasal congestion: Secondary | ICD-10-CM | POA: Diagnosis not present

## 2021-03-30 DIAGNOSIS — I10 Essential (primary) hypertension: Secondary | ICD-10-CM | POA: Insufficient documentation

## 2021-03-30 DIAGNOSIS — R5383 Other fatigue: Secondary | ICD-10-CM | POA: Insufficient documentation

## 2021-03-30 DIAGNOSIS — Z85828 Personal history of other malignant neoplasm of skin: Secondary | ICD-10-CM | POA: Insufficient documentation

## 2021-03-30 DIAGNOSIS — Z85038 Personal history of other malignant neoplasm of large intestine: Secondary | ICD-10-CM | POA: Diagnosis not present

## 2021-03-30 DIAGNOSIS — Z79899 Other long term (current) drug therapy: Secondary | ICD-10-CM | POA: Insufficient documentation

## 2021-03-30 DIAGNOSIS — Z7901 Long term (current) use of anticoagulants: Secondary | ICD-10-CM | POA: Insufficient documentation

## 2021-03-30 DIAGNOSIS — E119 Type 2 diabetes mellitus without complications: Secondary | ICD-10-CM | POA: Insufficient documentation

## 2021-03-30 DIAGNOSIS — R531 Weakness: Secondary | ICD-10-CM | POA: Diagnosis not present

## 2021-03-30 DIAGNOSIS — R197 Diarrhea, unspecified: Secondary | ICD-10-CM | POA: Diagnosis not present

## 2021-03-30 DIAGNOSIS — R059 Cough, unspecified: Secondary | ICD-10-CM | POA: Diagnosis present

## 2021-03-30 DIAGNOSIS — R000 Tachycardia, unspecified: Secondary | ICD-10-CM | POA: Insufficient documentation

## 2021-03-30 DIAGNOSIS — Z7984 Long term (current) use of oral hypoglycemic drugs: Secondary | ICD-10-CM | POA: Diagnosis not present

## 2021-03-30 DIAGNOSIS — Z20822 Contact with and (suspected) exposure to covid-19: Secondary | ICD-10-CM | POA: Diagnosis not present

## 2021-03-30 LAB — RESP PANEL BY RT-PCR (FLU A&B, COVID) ARPGX2
Influenza A by PCR: NEGATIVE
Influenza B by PCR: NEGATIVE
SARS Coronavirus 2 by RT PCR: NEGATIVE

## 2021-03-30 MED ORDER — BENZONATATE 100 MG PO CAPS: 100.0000 mg | ORAL_CAPSULE | Freq: Three times a day (TID) | ORAL | 0 refills | Status: DC

## 2021-03-30 NOTE — ED Notes (Signed)
Pt refusing discharge vitals. Patient Alert and oriented to baseline. Stable and ambulatory to baseline. Patient verbalized understanding of the discharge instructions.  Patient belongings were taken by the patient.

## 2021-03-30 NOTE — ED Provider Notes (Signed)
[facility] EMERGENCY DEPARTMENT Provider Note   CSN: [id number] Arrival date & time: 03/30/21  1418     History Chief Complaint  Patient presents with   Cough    Andrea Calderon is a 75 y.o. female with PMHx severe AS with plans for TAVR on 09/08 who presents to the ED Today with complaint of increased fatigue, dry cough, congestion, generalized weakness, and diarrhea for the past week. Pt reports that since she was diagnosed with A flutter earlier this month "things have been going downhill." Per chart review pt was seen in the ED on 08/05 for heart palpitations; when she arrived she was tachycardic in the 150s with EKG findings of a flutter. It seemed to resolve on its own at that time. Pt was started on Eliquis and Lopressor 12.5 mg. Pt reports that she saw her cardiologist after that and was switched from Lopressor to Sotalol 80 mg BID. Pt reports she feels like after started the Sotalol she began feeling very weak. She discussed this with her cardiologist who told her to hold off on it and then slowly increased to regular dose. She admits she was supposed to start it back last week however began feeling ill and has not started it back. She has been compliant with the Eliquis. Pt went to her PCP earlier this week and had a negative antigen COVID test. She went to [facility] today for pre-op workup including CT scans however continued to have a cough ad decided to come to the ED for further evaluation. Pt felt feverish last week however states her temperature was never > 99.0. No complaints of chest pain or SOB currently.   The history is provided by the patient and medical records.      Past Medical History:  Diagnosis Date   BCC (basal cell carcinoma), face 12/05/2014   Removed from right side of nose [facility] Dr Altamese Cabal and Dr Ferdinand Lango   Cancer [facility]    [facility] right nose   Diabetes mellitus    H/O: stroke 09/11/2011   September 2012 acute right retinal  artery occlusion H/o left retinal artery embolic event by imaging H/o blood clot in port during chemotherapy    Multiple thyroid nodules 95/10/2021   Oral lichen planus 10/08/3566   Other and unspecified hyperlipidemia 12/16/2012   Preventative health care 06/21/2013   Rosacea    Valvular heart disease 06/13/2012   Cardiologist Dr Darral Dash of [facility] in [facility]    Patient Active Problem List   Diagnosis Date Noted   Cough 05/20/2019   BCC (basal cell carcinoma), face 12/05/2014   Medicare annual wellness visit, subsequent 05/21/2014   Hyperlipidemia 12/16/2012   Valvular heart disease 06/13/2012   Multiple thyroid nodules 06/13/2012   H/O: stroke 61/68/3729   Oral lichen planus 09/16/1550   Malignant neoplasm of colon ([place]) 09/01/2010   DM (diabetes mellitus), 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   GOUT, UNSPECIFIED 09/01/2010   Overweight 09/01/2010   ESSENTIAL HYPERTENSION, BENIGN 09/01/2010   ABDOMINAL WALL HERNIA 09/01/2010   OTHER AND UNSPECIFIED OVARIAN CYST 09/01/2010   FACIAL PARESTHESIA 09/01/2010   ARRHYTHMIA, HX OF 09/01/2010   OTH SPEC PERS HX PRESENTING HAZARDS HEALTH OTH 09/01/2010    Past Surgical History:  Procedure Laterality Date   ABDOMINAL HYSTERECTOMY  2009   total   COLECTOMY  2009   MOHS SURGERY Right    for [facility] on right nose, Feb 2016  TONGUE BIOPSY  01/05/15   negative results   VENTRAL HERNIA REPAIR  11/11   4 lesions found, mesh left in place     OB History   No obstetric history on file.     Family History  Problem Relation Age of Onset   Transient ischemic attack Mother    Hypertension Mother    Dementia Mother    Diabetes Father        borderline sugar   Coronary artery disease Father    Heart disease Maternal Grandfather    Heart disease Paternal Grandfather     Social History   Tobacco Use   Smoking status: Never   Smokeless tobacco: Never  Vaping Use   Vaping Use: Never used  Substance Use  Topics   Alcohol use: Not Currently    Comment: rare, social   Drug use: No    Home Medications Prior to Admission medications   Medication Sig Start Date End Date Taking? Authorizing Provider  benzonatate (TESSALON) 100 MG capsule Take 1 capsule (100 mg total) by mouth every 8 (eight) hours. 03/30/21  Yes Keirsten Matuska, PA-C  apixaban (ELIQUIS) 5 MG TABS tablet Take 1 tablet (5 mg total) by mouth 2 (two) times daily. 03/10/21 04/09/21  Curatolo, Adam, DO  B Complex Vitamins (VITAMIN-B COMPLEX PO) Take 1 tablet by mouth daily. Reported on 12/22/2015    [provider]  blood glucose meter kit and supplies Dispense based on patient and insurance preference. Use up to rwo times daily as directed. DX: E11.9). Aviva Plus meter 12/24/17   [name], MD  glucose blood (ACCU-CHEK AVIVA PLUS) test strip Check blood sugars twice daily 12/24/17   [name], MD  hydrochlorothiazide (HYDRODIURIL) 25 MG tablet TAKE 1 TABLET BY MOUTH EVERY DAY 01/13/21   [name], MD  hydrocortisone 2.5 % cream Apply topically 2 (two) times daily as needed. 12/01/20   [name], MD  latanoprost (XALATAN) 0.005 % ophthalmic solution Place 1 drop into both eyes daily. 11/20/15   [provider]  lisinopril (ZESTRIL) 20 MG tablet TAKE 1 TABLET BY MOUTH EVERY DAY 01/13/21   [name], MD  metFORMIN (GLUCOPHAGE) 500 MG tablet TAKE 1 TABLET BY MOUTH 2 TIMES DAILY WITH A MEAL. 12/26/20   [name], MD  metoprolol succinate (TOPROL-XL) 25 MG 24 hr tablet Take 1/2 tablet (12.5 mg total) by mouth daily. 03/10/21 05/09/21  Curatolo, Adam, DO  OVER THE COUNTER MEDICATION Vita Fusion Fiber- 2 daily    [provider]  rosuvastatin (CRESTOR) 10 MG tablet TAKE 1 TABLET BY MOUTH EVERY DAY 01/13/21   [name], MD  Zoster Vaccine Adjuvanted [facility]) injection Inject into the muscle. 12/20/20   Carlyle Basques, MD    Allergies    Codeine  Review of Systems   Review of Systems   Constitutional:  Positive for fatigue. Negative for chills and fever.  HENT:  Positive for congestion.   Respiratory:  Positive for cough. Negative for shortness of breath.   Cardiovascular:  Negative for chest pain.  Gastrointestinal:  Positive for diarrhea. Negative for nausea and vomiting.  All other systems reviewed and are negative.  Physical Exam Updated Vital Signs BP 104/66   Pulse 78   Temp 97.8 F (36.6 C) (Oral)   Resp 16   Ht _0  (1.575 m)   Wt 68 kg   SpO2 93%   BMI 27.44 kg/m   Physical Exam Vitals and  nursing note reviewed.  Constitutional:      Appearance: She is not ill-appearing or diaphoretic.  HENT:     Head: Normocephalic and atraumatic.     Nose: Congestion present.     Right Sinus: No maxillary sinus tenderness or frontal sinus tenderness.     Left Sinus: No maxillary sinus tenderness or frontal sinus tenderness.     Mouth/Throat:     Pharynx: No oropharyngeal exudate or posterior oropharyngeal erythema.  Eyes:     Conjunctiva/sclera: Conjunctivae normal.  Cardiovascular:     Rate and Rhythm: Normal rate and regular rhythm.     Pulses: Normal pulses.     Heart sounds: Murmur heard.  Pulmonary:     Effort: Pulmonary effort is normal.     Breath sounds: Normal breath sounds. No wheezing, rhonchi or rales.     Comments: Speaking in full sentences without difficulty. Satting 99% on RA. LCTAB Abdominal:     Palpations: Abdomen is soft.     Tenderness: There is no abdominal tenderness. There is no guarding or rebound.  Musculoskeletal:     Cervical back: Neck supple.  Skin:    General: Skin is warm and dry.  Neurological:     Mental Status: She is alert.    ED Results / Procedures / Treatments   Labs (all labs ordered are listed, but only abnormal results are displayed) Labs Reviewed  RESP PANEL BY RT-PCR (FLU A&B, COVID) [id number]  COMPREHENSIVE METABOLIC PANEL  CBC WITH DIFFERENTIAL/PLATELET  TROPONIN I (HIGH SENSITIVITY)    EKG EKG  Interpretation  Date/Time:  Thursday March 30 2021 17:51:55 EDT Ventricular Rate:  80 PR Interval:  154 QRS Duration: 99 QT Interval:  392 QTC Calculation: 453 R Axis:   85 Text Interpretation: Sinus rhythm Borderline right axis deviation LVH with secondary repolarization abnormality ST depr, consider ischemia, inferior leads ST depressions and TWI unchanged since 03/10/21 Confirmed by Wandra Arthurs [phone]) on 03/30/2021 6:00:42 PM  Radiology DG Chest 2 View  Result Date: 03/30/2021 CLINICAL DATA:  Cough EXAM: CHEST - 2 VIEW COMPARISON:  03/10/2021 FINDINGS: The heart size and mediastinal contours are within normal limits. Both lungs are clear. The visualized skeletal structures are unremarkable. IMPRESSION: No active cardiopulmonary disease. Electronically Signed   By: Miachel Roux M.D.   On: 03/30/2021 15:29    Procedures Procedures   Medications Ordered in ED Medications - No data to display  ED Course  I have reviewed the triage vital signs and the nursing notes.  Pertinent labs & imaging results that were available during my care of the patient were reviewed by me and considered in my medical decision making (see chart for details).    MDM Rules/Calculators/A&P                           75 year old female presenting to the ED with complaint of cough, fatigue, generalized weakness, congestion, and diarrhea for the past week. Recent A flutter ED visit, started on Eliquis. Plans for TAVR s/2 aortic stenosis scheduled for 09/08. On arrival to the ED today VSS. Pt had a CXR done while in the waiting room without acute findings. On my exam she is noted to be congested nasally. She is able to speak in full sentences without difficulty and satting 99% on RA. Given her age and complaints of generalized weakness/diarrhea will plan for labs to assess electrolyte abnormalities. Will also plan for COVID/flu testing. Will obtain  EKG with recent A flutter diagnosis.   Pt appears to have had labs  done earlier today at [facility] for pre op. I am able to see them in the chart.   CBC: WBC 4.4 - 11.0 x 10*3/uL 5.7   RBC 4.10 - 5.10 x 10*6/uL 5.25 High    Hemoglobin 12.3 - 15.3 G/DL 14.2   Hematocrit 35.9 - 44.6 % 42.4   MCV 80.0 - 96.0 FL 80.8   MCH 27.5 - 33.2 PG 27.1 Low    MCHC 33.0 - 37.0 G/DL 33.5   RDW 12.3 - 17.0 % 15.7   Platelets 150 - 450 X 10*3/uL 160   MPV 6.8 - 10.2 FL 9.4    CMP Sodium 135 - 146 MMOL/L 126 Low    Potassium 3.5 - 5.3 MMOL/L 4.1   Comment: NO VISIBLE HEMOLYSIS  Chloride 98 - 110 MMOL/L 88 Low    CO2 21 - 31 MMOL/L 27   BUN 8 - 24 MG/DL 18   Glucose 70 - 99 MG/DL 103 High    Comment: Patients taking eltrombopag at doses >/= 100 mg daily may show falsely elevated values of 10% or greater.  Creatinine 0.60 - 1.20 MG/DL 0.69   Calcium 8.5 - 10.5 MG/DL 9.0   Total Protein 6.4 - 8.9 G/DL 7.3   Comment: Patients taking eltrombopag at doses >/= 100 mg daily may show falsely elevated values of 10% or greater.  Albumin  3.5 - 5.7 G/DL 4.3   Total Bilirubin 0.0 - 1.0 MG/DL 0.6   Comment: Patients taking eltrombopag at doses >/= 100 mg daily may show falsely elevated values of 10% or greater.  Alkaline Phosphatase 34 - 104 IU/L or U/L 67   AST (SGOT) 13 - 39 IU/L or U/L 21   ALT (SGPT) 7 - 52 IU/L or U/L 14   Anion Gap 4 - 14 MMOL/L 11   Est. GFR >=60 ML/MIN/1.73 M*2  ML/MIN/1.73 M*2 >90    Will hold off on labs at this time. Will still plan for EKG and COVID/flu test  EKG with ST depression and TWI however compared to previous it is essentially unchanged. Attending physician DR. Yao and I went in to speak with patient regarding EKG findings and to potentially get a troponin for further eval. Pt denies any chest pain or SOB whatsoever. She reports she has not had a normal EKG in over 15 years. Given her EKG is unchanged there is some reassurance at this time. We did have lengthy discussion with patient regarding need to return to the ED IMMEDIATELY for any  signs of chest pain or shortness of breath. Pt is in agreement to do so however politely declines troponin testing at this time. She would like to go home prior to COVID/flu test returning and is requesting something for her cough. Will discharge home with tessalon perles. Pt stable for discharge at this time.   This note was prepared using Dragon voice recognition software and may include unintentional dictation errors due to the inherent limitations of voice recognition software.   Final Clinical Impression(s) / ED Diagnoses Final diagnoses:  Cough    Rx / DC Orders ED Discharge Orders          Ordered    benzonatate (TESSALON) 100 MG capsule  Every 8 hours        03/30/21 1805             Discharge Instructions      We have  tested you for COVID and flu today. We will call you if you test positive. You can also log into Mychart later tonight to check the results.   Pick up cough medication and take as prescribed. Drink plenty of fluids to stay hydrated.   Return to the ED IMMEDIATELY for any signs of chest pain or shortness of breath given your EKG findings today.        Eustaquio Maize, PA-C 03/30/21 1806    Drenda Freeze, MD 04/05/21 2129

## 2021-03-30 NOTE — ED Notes (Signed)
Pt refusing blood draw at this time. States she had lab work this morning. PA made aware.

## 2021-03-30 NOTE — ED Notes (Signed)
New orders form PA [facility] Chest xray

## 2021-03-30 NOTE — Discharge Instructions (Addendum)
We have tested you for COVID and flu today. We will call you if you test positive. You can also log into Mychart later tonight to check the results.   Pick up cough medication and take as prescribed. Drink plenty of fluids to stay hydrated.   Return to the ED IMMEDIATELY for any signs of chest pain or shortness of breath given your EKG findings today.

## 2021-03-30 NOTE — ED Triage Notes (Addendum)
C/o dry cough x 1 week , recent DX aflutter , denies palpitations, cp, SOB. Reports Covid Neg test today

## 2021-04-20 ENCOUNTER — Inpatient Hospital Stay: Payer: Medicare Other | Admitting: Medical

## 2021-04-20 ENCOUNTER — Other Ambulatory Visit: Payer: Self-pay | Admitting: Family Medicine

## 2021-04-20 MED ORDER — ALLOPURINOL 100 MG PO TABS: 100.0000 mg | ORAL_TABLET | Freq: Every day | ORAL | 1 refills | Status: DC

## 2021-04-25 ENCOUNTER — Telehealth: Payer: Self-pay | Admitting: *Deleted

## 2021-04-25 NOTE — Telephone Encounter (Signed)
Patient does use [facility] for diabetic supplies.

## 2021-06-05 ENCOUNTER — Other Ambulatory Visit (INDEPENDENT_AMBULATORY_CARE_PROVIDER_SITE_OTHER): Payer: Medicare Other

## 2021-06-05 ENCOUNTER — Other Ambulatory Visit: Payer: Self-pay

## 2021-06-05 DIAGNOSIS — M109 Gout, unspecified: Secondary | ICD-10-CM

## 2021-06-05 DIAGNOSIS — E785 Hyperlipidemia, unspecified: Secondary | ICD-10-CM

## 2021-06-05 DIAGNOSIS — I10 Essential (primary) hypertension: Secondary | ICD-10-CM

## 2021-06-05 DIAGNOSIS — E1149 Type 2 diabetes mellitus with other diabetic neurological complication: Secondary | ICD-10-CM | POA: Diagnosis not present

## 2021-06-05 LAB — COMPREHENSIVE METABOLIC PANEL
ALT: 11 U/L (ref 0–35)
AST: 17 U/L (ref 0–37)
Albumin: 4.1 g/dL (ref 3.5–5.2)
Alkaline Phosphatase: 64 U/L (ref 39–117)
BUN: 19 mg/dL (ref 6–23)
CO2: 28 mEq/L (ref 19–32)
Calcium: 9 mg/dL (ref 8.4–10.5)
Chloride: 99 mEq/L (ref 96–112)
Creatinine, Ser: 0.77 mg/dL (ref 0.40–1.20)
GFR: 75.08 mL/min (ref >60.00)
Glucose, Bld: 127 mg/dL — ABNORMAL HIGH (ref 70–99)
Potassium: 3.9 mEq/L (ref 3.5–5.1)
Sodium: 136 mEq/L (ref 135–145)
Total Bilirubin: 0.4 mg/dL (ref 0.2–1.2)
Total Protein: 6.8 g/dL (ref 6.0–8.3)

## 2021-06-05 LAB — CBC
HCT: 39.5 % (ref 36.0–46.0)
Hemoglobin: 12.7 g/dL (ref 12.0–15.0)
MCHC: 32.1 g/dL (ref 30.0–36.0)
MCV: 84.8 fl (ref 78.0–100.0)
Platelets: 227 10*3/uL (ref 150.0–400.0)
RBC: 4.67 Mil/uL (ref 3.87–5.11)
RDW: 16.8 % — ABNORMAL HIGH (ref 11.5–15.5)
WBC: 9 10*3/uL (ref 4.0–10.5)

## 2021-06-05 LAB — URIC ACID: Uric Acid, Serum: 6.2 mg/dL (ref 2.4–7.0)

## 2021-06-05 LAB — LIPID PANEL
Cholesterol: 145 mg/dL (ref 0–200)
HDL: 51.7 mg/dL (ref >39.00)
LDL Cholesterol: 70 mg/dL (ref 0–99)
NonHDL: 93.02
Total CHOL/HDL Ratio: 3
Triglycerides: 117 mg/dL (ref 0.0–149.0)
VLDL: 23.4 mg/dL (ref 0.0–40.0)

## 2021-06-05 LAB — TSH: TSH: 3.39 u[IU]/mL (ref 0.35–5.50)

## 2021-06-05 LAB — HEMOGLOBIN A1C: Hgb A1c MFr Bld: 6.7 % — ABNORMAL HIGH (ref 4.6–6.5)

## 2021-06-06 ENCOUNTER — Encounter: Payer: Self-pay | Admitting: Family Medicine

## 2021-06-06 ENCOUNTER — Other Ambulatory Visit: Payer: Self-pay | Admitting: Family Medicine

## 2021-06-06 MED ORDER — CEFDINIR 300 MG PO CAPS: 300.0000 mg | ORAL_CAPSULE | Freq: Two times a day (BID) | ORAL | 0 refills | Status: AC

## 2021-06-29 ENCOUNTER — Other Ambulatory Visit: Payer: Self-pay | Admitting: Family Medicine

## 2021-07-13 ENCOUNTER — Encounter: Payer: Self-pay | Admitting: Family Medicine

## 2021-07-13 ENCOUNTER — Ambulatory Visit (INDEPENDENT_AMBULATORY_CARE_PROVIDER_SITE_OTHER): Payer: Medicare Other | Admitting: Family Medicine

## 2021-07-13 VITALS — BP 112/60 | HR 69 | Temp 97.5°F | Resp 16 | Ht 62.0 in | Wt 155.2 lb

## 2021-07-13 DIAGNOSIS — I10 Essential (primary) hypertension: Secondary | ICD-10-CM | POA: Diagnosis not present

## 2021-07-13 DIAGNOSIS — E1149 Type 2 diabetes mellitus with other diabetic neurological complication: Secondary | ICD-10-CM | POA: Diagnosis not present

## 2021-07-13 DIAGNOSIS — I38 Endocarditis, valve unspecified: Secondary | ICD-10-CM

## 2021-07-13 DIAGNOSIS — M109 Gout, unspecified: Secondary | ICD-10-CM

## 2021-07-13 DIAGNOSIS — E785 Hyperlipidemia, unspecified: Secondary | ICD-10-CM

## 2021-07-13 MED ORDER — MUPIROCIN 2 % EX OINT: 1.0000 "application " | TOPICAL_OINTMENT | Freq: Every evening | CUTANEOUS | 1 refills | Status: DC | PRN

## 2021-07-13 NOTE — Assessment & Plan Note (Signed)
Hydrate and monitor 

## 2021-07-13 NOTE — Patient Instructions (Signed)

## 2021-07-13 NOTE — Progress Notes (Signed)
Patient ID: Andrea Calderon, female    DOB: 03/17/1946  Age: 75 y.o. MRN: [id number]    Subjective:   No chief complaint on file.  Subjective   HPI Andrea Calderon presents for office visit today for follow up on TAVR and type 2 diabetes. She is doing well and has no recent febrile illnesses or recent ER visits to report.  Coughs: She reports that the onset was back in September. At the moment, she deals with postnasal drip in the morning. She states the antibiotics px helped alleviate symptoms. She had trouble with breathing, but now it is not as bad. She sometimes coughs up blood and notes blood when blowing nose. Denies CP/palp/SOB/HA/congestion/fevers/GI or GU c/o. Taking meds as prescribed.  had flare up in hospital it was local to right toe Gout: She reports that within the last 6-8 weeks she has had a flare up in feet bilaterally. As a result, she is doing PT. At the moment, she reports that PT has helped and she feels much stronger in LE's and feels a lot better. However, last week she has started experiencing gout symptoms again.   Review of Systems  Constitutional:  Negative for chills, fatigue and fever.  HENT:  Positive for postnasal drip. Negative for congestion, rhinorrhea, sinus pressure, sinus pain, sore throat and trouble swallowing.   Eyes:  Negative for pain.  Respiratory:  Positive for cough. Negative for shortness of breath.   Cardiovascular:  Negative for chest pain, palpitations and leg swelling.  Gastrointestinal:  Negative for abdominal pain, blood in stool, diarrhea, nausea and vomiting.  Genitourinary:  Negative for decreased urine volume, flank pain, frequency, vaginal bleeding and vaginal discharge.  Musculoskeletal:  Negative for back pain.  Neurological:  Negative for headaches.   History Past Medical History:  Diagnosis Date   BCC (basal cell carcinoma), face 12/05/2014   Removed from right side of nose [facility] Dr Altamese Cabal  and Dr Ferdinand Lango   Cancer [facility])    [facility] right nose   Diabetes mellitus    H/O: stroke 09/11/2011   September 2012 acute right retinal artery occlusion H/o left retinal artery embolic event by imaging H/o blood clot in port during chemotherapy    Multiple thyroid nodules 99/09/4266   Oral lichen planus 10/07/1960   Other and unspecified hyperlipidemia 12/16/2012   Preventative health care 06/21/2013   Rosacea    Valvular heart disease 06/13/2012   Cardiologist Dr Darral Dash of [facility] in [facility]    She has a past surgical history that includes Colectomy (2009); Abdominal hysterectomy (2009); Ventral hernia repair (11/11); Mohs surgery (Right); and Tongue Biopsy (01/05/15).   Her family history includes Coronary artery disease in her father; Dementia in her mother; Diabetes in her father; Heart disease in her maternal grandfather and paternal grandfather; Hypertension in her mother; Transient ischemic attack in her mother.She reports that she has never smoked. She has never used smokeless tobacco. She reports that she does not currently use alcohol. She reports that she does not use drugs.  Current Outpatient Medications on File Prior to Visit  Medication Sig Dispense Refill   allopurinol (ZYLOPRIM) 100 MG tablet Take 1 tablet (100 mg total) by mouth daily. 90 tablet 1   B Complex Vitamins (VITAMIN-B COMPLEX PO) Take 1 tablet by mouth daily. Reported on 12/22/2015     blood glucose meter kit and supplies Dispense based on patient and insurance preference. Use up to rwo times  daily as directed. DX: E11.9). Aviva Plus meter 1 each 0   glucose blood (ACCU-CHEK AVIVA PLUS) test strip Check blood sugars twice daily 100 each 12   hydrochlorothiazide (HYDRODIURIL) 25 MG tablet TAKE 1 TABLET BY MOUTH EVERY DAY 90 tablet 1   hydrocortisone 2.5 % cream Apply topically 2 (two) times daily as needed. 28 g 3   latanoprost (XALATAN) 0.005 % ophthalmic solution Place 1 drop into both eyes daily.  3    lisinopril (ZESTRIL) 20 MG tablet TAKE 1 TABLET BY MOUTH EVERY DAY 90 tablet 1   metFORMIN (GLUCOPHAGE) 500 MG tablet TAKE 1 TABLET BY MOUTH 2 TIMES DAILY WITH A MEAL. 180 tablet 1   OVER THE COUNTER MEDICATION Vita Fusion Fiber- 2 daily     rosuvastatin (CRESTOR) 10 MG tablet TAKE 1 TABLET BY MOUTH EVERY DAY 90 tablet 1   apixaban (ELIQUIS) 5 MG TABS tablet Take 1 tablet (5 mg total) by mouth 2 (two) times daily. 60 tablet 0   No current facility-administered medications on file prior to visit.     Objective:  Objective  Physical Exam Constitutional:      General: She is not in acute distress.    Appearance: Normal appearance. She is not ill-appearing or toxic-appearing.  HENT:     Head: Normocephalic and atraumatic.     Right Ear: Tympanic membrane, ear canal and external ear normal.     Left Ear: Tympanic membrane, ear canal and external ear normal.     Nose: No congestion or rhinorrhea.  Eyes:     Extraocular Movements: Extraocular movements intact.     Right eye: No nystagmus.     Left eye: No nystagmus.     Pupils: Pupils are equal, round, and reactive to light.  Cardiovascular:     Rate and Rhythm: Normal rate and regular rhythm.     Pulses: Normal pulses.     Heart sounds: Normal heart sounds. No murmur heard. Pulmonary:     Effort: Pulmonary effort is normal. No respiratory distress.     Breath sounds: Normal breath sounds. No wheezing, rhonchi or rales.  Abdominal:     General: Bowel sounds are normal.     Palpations: Abdomen is soft. There is no mass.     Tenderness: There is no abdominal tenderness. There is no guarding.     Hernia: No hernia is present.  Musculoskeletal:        General: Normal range of motion.     Cervical back: Normal range of motion and neck supple.  Skin:    General: Skin is warm and dry.  Neurological:     Mental Status: She is alert and oriented to person, place, and time.     Deep Tendon Reflexes:     Reflex Scores:      Patellar  reflexes are 2+ on the right side and 2+ on the left side. Psychiatric:        Behavior: Behavior normal.   BP 112/60   Pulse 69   Temp (!) 97.5 F (36.4 C)   Resp 16   Ht 5' 2"  (1.575 m)   Wt 155 lb 3.2 oz (70.4 kg)   SpO2 98%   BMI 28.39 kg/m  Wt Readings from Last 3 Encounters:  07/13/21 155 lb 3.2 oz (70.4 kg)  03/30/21 150 lb (68 kg)  03/10/21 155 lb (70.3 kg)     Lab Results  Component Value Date   WBC 9.0 06/05/2021   HGB  12.7 06/05/2021   HCT 39.5 06/05/2021   PLT 227.0 06/05/2021   GLUCOSE 127 (H) 06/05/2021   CHOL 145 06/05/2021   TRIG 117.0 06/05/2021   HDL 51.70 06/05/2021   LDLDIRECT 182.4 09/11/2011   LDLCALC 70 06/05/2021   ALT 11 06/05/2021   AST 17 06/05/2021   NA 136 06/05/2021   K 3.9 06/05/2021   CL 99 06/05/2021   CREATININE 0.77 06/05/2021   BUN 19 06/05/2021   CO2 28 06/05/2021   TSH 3.39 06/05/2021   INR 0.9 09/11/2011   HGBA1C 6.7 (H) 06/05/2021    DG Chest 2 View  Result Date: 03/30/2021 CLINICAL DATA:  Cough EXAM: CHEST - 2 VIEW COMPARISON:  03/10/2021 FINDINGS: The heart size and mediastinal contours are within normal limits. Both lungs are clear. The visualized skeletal structures are unremarkable. IMPRESSION: No active cardiopulmonary disease. Electronically Signed   By: Miachel Roux M.D.   On: 03/30/2021 15:29     Assessment & Plan:  Plan    Meds ordered this encounter  Medications   mupirocin ointment (BACTROBAN) 2 %    Sig: Place 1 application into the nose at bedtime as needed.    Dispense:  22 g    Refill:  1     Problem List Items Addressed This Visit     DM (diabetes mellitus), type 2 with neurological complications ([place]) - Primary    hgba1c acceptable, minimize simple carbs. Increase exercise as tolerated. Continue current meds      Relevant Orders   Hemoglobin A1c   GOUT, UNSPECIFIED    Hydrate and monitor      Relevant Orders   Uric acid   ESSENTIAL HYPERTENSION, BENIGN    Well controlled, no  changes to meds. Encouraged heart healthy diet such as the DASH diet and exercise as tolerated.       Relevant Orders   CBC   Comprehensive metabolic panel   TSH   Valvular heart disease    Asymptomatic and follows with cardiology      Hyperlipidemia    Encourage heart healthy diet such as MIND or DASH diet, increase exercise, avoid trans fats, simple carbohydrates and processed foods, consider a krill or fish or flaxseed oil cap daily. Tolerating Rosuvastatin      Relevant Orders   Lipid panel    Follow-up: No follow-ups on file.  I, Suezanne Jacquet, acting as a scribe for Penni Homans, MD, have documented all relevent documentation on behalf of Penni Homans, MD, as directed by Penni Homans, MD while in the presence of Penni Homans, MD. DO:07/14/21.  I, [name], MD personally performed the services described in this documentation. All medical record entries made by the scribe were at my direction and in my presence. I have reviewed the chart and agree that the record reflects my personal performance and is accurate and complete

## 2021-07-13 NOTE — Assessment & Plan Note (Signed)
Well controlled, no changes to meds. Encouraged heart healthy diet such as the DASH diet and exercise as tolerated.  °

## 2021-07-14 NOTE — Assessment & Plan Note (Signed)
Encourage heart healthy diet such as MIND or DASH diet, increase exercise, avoid trans fats, simple carbohydrates and processed foods, consider a krill or fish or flaxseed oil cap daily.  Tolerating Rosuvastatin 

## 2021-07-14 NOTE — Assessment & Plan Note (Signed)
hgba1c acceptable, minimize simple carbs. Increase exercise as tolerated. Continue current meds 

## 2021-07-14 NOTE — Assessment & Plan Note (Signed)
Asymptomatic and follows with cardiology

## 2021-09-19 ENCOUNTER — Encounter: Payer: Self-pay | Admitting: Family Medicine

## 2021-09-19 ENCOUNTER — Other Ambulatory Visit: Payer: Medicare Other

## 2021-09-19 ENCOUNTER — Other Ambulatory Visit: Payer: Self-pay

## 2021-09-19 DIAGNOSIS — R350 Frequency of micturition: Secondary | ICD-10-CM

## 2021-09-19 NOTE — Addendum Note (Signed)
Addended by: Kelle Darting A on: 09/19/2021 01:58 PM   Modules accepted: Orders

## 2021-09-20 LAB — URINE CULTURE
MICRO NUMBER:: 13006851
SPECIMEN QUALITY:: ADEQUATE

## 2021-09-23 ENCOUNTER — Emergency Department (HOSPITAL_BASED_OUTPATIENT_CLINIC_OR_DEPARTMENT_OTHER): Payer: Medicare Other

## 2021-09-23 ENCOUNTER — Other Ambulatory Visit: Payer: Self-pay

## 2021-09-23 ENCOUNTER — Emergency Department (HOSPITAL_BASED_OUTPATIENT_CLINIC_OR_DEPARTMENT_OTHER)
Admission: EM | Admit: 2021-09-23 | Discharge: 2021-09-23 | Disposition: A | Discharge status: Home / Self Care | Payer: Medicare Other | Attending: Emergency Medicine | Admitting: Emergency Medicine

## 2021-09-23 ENCOUNTER — Encounter (HOSPITAL_BASED_OUTPATIENT_CLINIC_OR_DEPARTMENT_OTHER): Payer: Self-pay | Admitting: Emergency Medicine

## 2021-09-23 DIAGNOSIS — R310 Gross hematuria: Secondary | ICD-10-CM | POA: Diagnosis not present

## 2021-09-23 DIAGNOSIS — R350 Frequency of micturition: Secondary | ICD-10-CM | POA: Diagnosis not present

## 2021-09-23 DIAGNOSIS — Z79899 Other long term (current) drug therapy: Secondary | ICD-10-CM | POA: Insufficient documentation

## 2021-09-23 LAB — CBC WITH DIFFERENTIAL/PLATELET
Abs Immature Granulocytes: 0.03 10*3/uL (ref 0.00–0.07)
Basophils Absolute: 0.1 10*3/uL (ref 0.0–0.1)
Basophils Relative: 1 %
Eosinophils Absolute: 0.1 10*3/uL (ref 0.0–0.5)
Eosinophils Relative: 1 %
HCT: 41.5 % (ref 36.0–46.0)
Hemoglobin: 13.6 g/dL (ref 12.0–15.0)
Immature Granulocytes: 0 %
Lymphocytes Relative: 16 %
Lymphs Abs: 1.7 10*3/uL (ref 0.7–4.0)
MCH: 27.3 pg (ref 26.0–34.0)
MCHC: 32.8 g/dL (ref 30.0–36.0)
MCV: 83.2 fL (ref 80.0–100.0)
Monocytes Absolute: 0.8 10*3/uL (ref 0.1–1.0)
Monocytes Relative: 7 %
Neutro Abs: 8.1 10*3/uL — ABNORMAL HIGH (ref 1.7–7.7)
Neutrophils Relative %: 75 %
Platelets: 231 10*3/uL (ref 150–400)
RBC: 4.99 MIL/uL (ref 3.87–5.11)
RDW: 16 % — ABNORMAL HIGH (ref 11.5–15.5)
WBC: 10.7 10*3/uL — ABNORMAL HIGH (ref 4.0–10.5)
nRBC: 0 % (ref 0.0–0.2)

## 2021-09-23 LAB — URINALYSIS, MICROSCOPIC (REFLEX): RBC / HPF: >50 RBC/hpf (ref 0–5)

## 2021-09-23 LAB — URINALYSIS, ROUTINE W REFLEX MICROSCOPIC

## 2021-09-23 LAB — BASIC METABOLIC PANEL
Anion gap: 7 (ref 5–15)
BUN: 16 mg/dL (ref 8–23)
CO2: 28 mmol/L (ref 22–32)
Calcium: 9 mg/dL (ref 8.9–10.3)
Chloride: 99 mmol/L (ref 98–111)
Creatinine, Ser: 0.84 mg/dL (ref 0.44–1.00)
GFR, Estimated: >60 mL/min (ref >60)
Glucose, Bld: 95 mg/dL (ref 70–99)
Potassium: 3.8 mmol/L (ref 3.5–5.1)
Sodium: 134 mmol/L — ABNORMAL LOW (ref 135–145)

## 2021-09-23 MED ORDER — CEPHALEXIN 500 MG PO CAPS: 500.0000 mg | ORAL_CAPSULE | Freq: Two times a day (BID) | ORAL | 0 refills | Status: AC

## 2021-09-23 NOTE — ED Triage Notes (Signed)
States has been having frequency in urination this week. Hematuria this am , on thinners

## 2021-09-23 NOTE — ED Provider Notes (Signed)
[facility] EMERGENCY DEPARTMENT Provider Note   CSN: [id number] Arrival date & time: 09/23/21  0944     History  No chief complaint on file.   Andrea Calderon is a 76 y.o. 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  Patient states that she contacted her PCP earlier this week with complaints of urinary frequency.  They tested her urine which was negative for infection.  On chart review, I noted a negative urine culture but cannot find results for urinalysis.  She felt like her symptoms went away on their own, however yesterday urinary frequency returned.  When she urinated today, she noted moderate amount of blood in the bowl.  She denies abdominal pain, dysuria, vaginal pain, vaginal bleeding/discharge.  She has no other complaints.  HPI     Home Medications Prior to Admission medications   Medication Sig Start Date End Date Taking? Authorizing Provider  cephALEXin (KEFLEX) 500 MG capsule Take 1 capsule (500 mg total) by mouth 2 (two) times daily for 7 days. 09/23/21 09/30/21 Yes Tonye Pearson, PA-C  allopurinol (ZYLOPRIM) 100 MG tablet Take 1 tablet (100 mg total) by mouth daily. 04/20/21   [name], MD  apixaban (ELIQUIS) 5 MG TABS tablet Take 1 tablet (5 mg total) by mouth 2 (two) times daily. 03/10/21 04/09/21  Curatolo, Adam, DO  B Complex Vitamins (VITAMIN-B COMPLEX PO) Take 1 tablet by mouth daily. Reported on 12/22/2015    [provider]  blood glucose meter kit and supplies Dispense based on patient and insurance preference. Use up to rwo times daily as directed. DX: E11.9). Aviva Plus meter 12/24/17   [name], MD  glucose blood (ACCU-CHEK AVIVA PLUS) test strip Check blood sugars twice daily 12/24/17   [name], MD  hydrochlorothiazide (HYDRODIURIL) 25 MG tablet TAKE 1 TABLET BY MOUTH EVERY DAY 01/13/21    [name], MD  hydrocortisone 2.5 % cream Apply topically 2 (two) times daily as needed. 12/01/20   [name], MD  latanoprost (XALATAN) 0.005 % ophthalmic solution Place 1 drop into both eyes daily. 11/20/15   [provider]  lisinopril (ZESTRIL) 20 MG tablet TAKE 1 TABLET BY MOUTH EVERY DAY 07/03/21   [name], MD  metFORMIN (GLUCOPHAGE) 500 MG tablet TAKE 1 TABLET BY MOUTH 2 TIMES DAILY WITH A MEAL. 07/03/21   [name], MD  mupirocin ointment (BACTROBAN) 2 % Place 1 application into the nose at bedtime as needed. 07/13/21   [name], MD  OVER THE COUNTER MEDICATION Vita Fusion Fiber- 2 daily    [provider]  rosuvastatin (CRESTOR) 10 MG tablet TAKE 1 TABLET BY MOUTH EVERY DAY 07/03/21   [name], MD      Allergies    Codeine    Review of Systems   Review of Systems  Genitourinary:  Positive for frequency and hematuria.   Physical Exam Updated Vital Signs BP (!) 142/62    Pulse 61    Temp 97.6 F (36.4 C) (Oral)    Resp 15    Ht 5' 2" (1.575 m)    Wt 68 kg    SpO2 98%    BMI 27.44 kg/m  Physical Exam Vitals and nursing note reviewed.  Constitutional:      General: She is not in acute distress.  Appearance: She is not ill-appearing.  HENT:     Head: Atraumatic.  Eyes:     Conjunctiva/sclera: Conjunctivae normal.  Cardiovascular:     Rate and Rhythm: Normal rate and regular rhythm.     Pulses: Normal pulses.          Radial pulses are 2+ on the right side and 2+ on the left side.       Dorsalis pedis pulses are 2+ on the right side and 2+ on the left side.     Heart sounds: Murmur heard.  Systolic murmur is present with a grade of 2/6.  Pulmonary:     Effort: Pulmonary effort is normal. No respiratory distress.     Breath sounds: Normal breath sounds.  Abdominal:     General: Abdomen is flat. There is no distension.     Palpations: Abdomen is soft.     Tenderness: There is no abdominal tenderness. There is no  right CVA tenderness or left CVA tenderness. Negative signs include Murphy's sign.  Musculoskeletal:        General: Normal range of motion.     Cervical back: Normal range of motion.  Skin:    General: Skin is warm and dry.     Capillary Refill: Capillary refill takes less than 2 seconds.  Neurological:     General: No focal deficit present.     Mental Status: She is alert.  Psychiatric:        Mood and Affect: Mood normal.    ED Results / Procedures / Treatments   Labs (all labs ordered are listed, but only abnormal results are displayed) Labs Reviewed  URINALYSIS, ROUTINE W REFLEX MICROSCOPIC - Abnormal; Notable for the following components:      Result Value   Color, Urine RED (*)    APPearance TURBID (*)    Glucose, UA   (*)    Value: TEST NOT REPORTED DUE TO COLOR INTERFERENCE OF URINE PIGMENT   Hgb urine dipstick   (*)    Value: TEST NOT REPORTED DUE TO COLOR INTERFERENCE OF URINE PIGMENT   Bilirubin Urine   (*)    Value: TEST NOT REPORTED DUE TO COLOR INTERFERENCE OF URINE PIGMENT   Ketones, ur   (*)    Value: TEST NOT REPORTED DUE TO COLOR INTERFERENCE OF URINE PIGMENT   Protein, ur   (*)    Value: TEST NOT REPORTED DUE TO COLOR INTERFERENCE OF URINE PIGMENT   Nitrite   (*)    Value: TEST NOT REPORTED DUE TO COLOR INTERFERENCE OF URINE PIGMENT   Leukocytes,Ua   (*)    Value: TEST NOT REPORTED DUE TO COLOR INTERFERENCE OF URINE PIGMENT   All other components within normal limits  URINALYSIS, MICROSCOPIC (REFLEX) - Abnormal; Notable for the following components:   Bacteria, UA RARE (*)    All other components within normal limits  CBC WITH DIFFERENTIAL/PLATELET - Abnormal; Notable for the following components:   WBC 10.7 (*)    RDW 16.0 (*)    Neutro Abs 8.1 (*)    All other components within normal limits  BASIC METABOLIC PANEL - Abnormal; Notable for the following components:   Sodium 134 (*)    All other components within normal limits  URINE CULTURE     EKG None  Radiology CT Renal Stone Study  Result Date: 09/23/2021 CLINICAL DATA:  Hematuria EXAM: CT ABDOMEN AND PELVIS WITHOUT CONTRAST TECHNIQUE: Multidetector CT imaging of the abdomen and pelvis was performed  following the standard protocol without IV contrast. RADIATION DOSE REDUCTION: This exam was performed according to the departmental dose-optimization program which includes automated exposure control, adjustment of the mA and/or kV according to patient size and/or use of iterative reconstruction technique. COMPARISON:  05/24/2010 FINDINGS: Lower chest: There is prosthetic stent in the aortic valve. Dense calcifications are seen in the mitral annulus. There is an implanted intracardiac device. Hepatobiliary: No focal abnormality is seen in the liver. Gallbladder is unremarkable. Pancreas: No focal abnormality is seen. Spleen: Unremarkable. Adrenals/Urinary Tract: Adrenals are unremarkable. There is no hydronephrosis. There are no renal or ureteral stones. Urinary bladder is not distended. There is subtle increased density in the dependent portion of bladder lumen. Stomach/Bowel: Stomach is moderately distended. Small bowel loops are not dilated. Appendix is not seen. Scattered diverticula are seen in colon without signs of focal acute diverticulitis. Vascular/Lymphatic: Scattered arterial calcifications are seen. No significant lymphadenopathy seen. Reproductive: Uterus is not seen. Other: Is no ascites or pneumoperitoneum. Musculoskeletal: Unremarkable. IMPRESSION: There is no evidence intestinal obstruction or pneumoperitoneum. There is no hydronephrosis. There are no renal or ureteral stones. There is subtle increased density in the dependent portion of bladder lumen possibly blood products. If clinically warranted, contrast-enhanced CT or cystoscopy may be considered. Scattered diverticula are seen in the colon without signs of focal acute diverticulitis. Other findings as described in the  body of the report. Electronically Signed   By: Elmer Picker M.D.   On: 09/23/2021 11:19    Procedures Procedures   Medications Ordered in ED Medications - No data to display  ED Course/ Medical Decision Making/ A&P                           Medical Decision Making Amount and/or Complexity of Data Reviewed Labs: ordered. Radiology: ordered.  Risk Prescription drug management.   History:  Per HPI  Initial impression:  This patient presents to the ED for concern of hematuria, this involves an extensive number of treatment options, and is a complaint that carries with it a high risk of complications and morbidity.   DDx includes UTI, pyelonephritis, renal stone, bladder cancer  ED Course: 76 year old female resting comfortably in bed, no apparent distress.  Nontoxic-appearing vitals are stable.  Physical exam benign with negative abdominal/pelvic pain.  UA with large amounts of blood, masking completion of remaining urinalysis.  Will likely treat for UTI, however will obtain CT renal stone to rule out renal stone as cause of occult hematuria. BMP normal, CBC with leukocytosis of up to 10.7.  Renal stone study without evidence of stone or pyelonephritis, however there were a large amount of blood products seen within the bladder lumen.  Patient denies urinary retention or slow stream.  Lab Tests and EKG:  I Ordered, reviewed, and interpreted labs and EKG.     Imaging Studies ordered:  I ordered imaging studies including CT renal stone  I independently visualized and interpreted imaging and I agree with the radiologist interpretation.    Cardiac Monitoring:  The patient was maintained on a cardiac monitor.  I personally viewed and interpreted the cardiac monitored which showed an underlying rhythm of: NSR   Disposition:  After consideration of the diagnostic results, physical exam, history and the patients response to treatment feel that the patent would benefit  from discharge with strict return precautions.   Gross hematuria: Discussed labs and imaging with patient and husband at bedside.  Advised that urinalysis  was difficult to ascertain whether or not she had an infection given the large amounts of blood on the sample.  We will treat preemptively with Keflex out of abundance of caution given that she is symptomatic.  She is to follow-up outpatient with a urology referral provided in order to have cystoscopy done to evaluate the blood products within her bladder.  We discussed strict return precautions when to return to the ED.  All questions were asked and answered.  Patient was discharged home in good condition.    Final Clinical Impression(s) / ED Diagnoses Final diagnoses:  Gross hematuria    Rx / DC Orders ED Discharge Orders          Ordered    cephALEXin (KEFLEX) 500 MG capsule  2 times daily        09/23/21 1300              Tonye Pearson, [place] 09/24/21 1026    Truddie Hidden, MD 09/24/21 [phone]

## 2021-09-23 NOTE — Discharge Instructions (Addendum)
Your wound today did show lots of blood, however we are unable to detect officially if you had a urinary infection or not.  Your CT renal stone study was negative for renal stones or kidney infection, however there was a lot of blood seen within your bladder.  You will need to follow this up with a urologist and have something called cystoscopy done to better look at the bladder.  I sent you in a prescription of Keflex for any underlying infection.  If you develop an obstruction, and have difficulty producing stream or passing plenty of blood clots, please return to the emergency department for evaluation.

## 2021-09-24 LAB — URINE CULTURE

## 2021-10-03 ENCOUNTER — Encounter: Payer: Self-pay | Admitting: Family Medicine

## 2021-10-04 ENCOUNTER — Other Ambulatory Visit: Payer: Self-pay

## 2021-10-04 ENCOUNTER — Other Ambulatory Visit: Payer: Self-pay | Admitting: Family Medicine

## 2021-10-04 DIAGNOSIS — I10 Essential (primary) hypertension: Secondary | ICD-10-CM

## 2021-10-04 MED ORDER — VALSARTAN 160 MG PO TABS: 160.0000 mg | ORAL_TABLET | Freq: Every day | ORAL | 3 refills | Status: DC

## 2021-10-04 NOTE — Telephone Encounter (Signed)
Pt notified. Rx sent in. Pt declines 6 week f/u as she has apt with Dr. Charlett Blake in June. ?

## 2021-10-18 LAB — HM DIABETES EYE EXAM

## 2021-10-28 ENCOUNTER — Other Ambulatory Visit: Payer: Self-pay | Admitting: Family Medicine

## 2021-11-01 ENCOUNTER — Encounter: Payer: Self-pay | Admitting: Family Medicine

## 2021-11-06 ENCOUNTER — Encounter: Payer: Self-pay | Admitting: Family Medicine

## 2021-11-08 ENCOUNTER — Encounter: Payer: Self-pay | Admitting: Family Medicine

## 2021-11-14 ENCOUNTER — Other Ambulatory Visit: Payer: Medicare Other

## 2021-12-05 ENCOUNTER — Telehealth: Payer: Self-pay | Admitting: Family Medicine

## 2021-12-05 NOTE — Telephone Encounter (Signed)
Spoke to patient to schedule Medicare Annual Wellness Visit (AWV) either virtually or in office. ? ?Patient declined stating she will talk to pcp when she sees her in June ? ? ?Last AWV ;08/18/18  ?

## 2021-12-18 ENCOUNTER — Other Ambulatory Visit: Payer: Self-pay | Admitting: Family Medicine

## 2022-01-11 ENCOUNTER — Other Ambulatory Visit (INDEPENDENT_AMBULATORY_CARE_PROVIDER_SITE_OTHER): Payer: Medicare Other

## 2022-01-11 DIAGNOSIS — M109 Gout, unspecified: Secondary | ICD-10-CM

## 2022-01-11 DIAGNOSIS — E1149 Type 2 diabetes mellitus with other diabetic neurological complication: Secondary | ICD-10-CM

## 2022-01-11 DIAGNOSIS — I10 Essential (primary) hypertension: Secondary | ICD-10-CM

## 2022-01-11 DIAGNOSIS — E785 Hyperlipidemia, unspecified: Secondary | ICD-10-CM | POA: Diagnosis not present

## 2022-01-11 LAB — COMPREHENSIVE METABOLIC PANEL
ALT: 13 U/L (ref 0–35)
AST: 19 U/L (ref 0–37)
Albumin: 4.3 g/dL (ref 3.5–5.2)
Alkaline Phosphatase: 77 U/L (ref 39–117)
BUN: 16 mg/dL (ref 6–23)
CO2: 29 mEq/L (ref 19–32)
Calcium: 9.8 mg/dL (ref 8.4–10.5)
Chloride: 99 mEq/L (ref 96–112)
Creatinine, Ser: 0.82 mg/dL (ref 0.40–1.20)
GFR: 69.33 mL/min (ref >60.00)
Glucose, Bld: 130 mg/dL — ABNORMAL HIGH (ref 70–99)
Potassium: 4.5 mEq/L (ref 3.5–5.1)
Sodium: 135 mEq/L (ref 135–145)
Total Bilirubin: 0.5 mg/dL (ref 0.2–1.2)
Total Protein: 7.2 g/dL (ref 6.0–8.3)

## 2022-01-11 LAB — LIPID PANEL
Cholesterol: 159 mg/dL (ref 0–200)
HDL: 60.7 mg/dL (ref >39.00)
LDL Cholesterol: 71 mg/dL (ref 0–99)
NonHDL: 98.17
Total CHOL/HDL Ratio: 3
Triglycerides: 136 mg/dL (ref 0.0–149.0)
VLDL: 27.2 mg/dL (ref 0.0–40.0)

## 2022-01-11 LAB — TSH: TSH: 2.79 u[IU]/mL (ref 0.35–5.50)

## 2022-01-11 LAB — URIC ACID: Uric Acid, Serum: 6.9 mg/dL (ref 2.4–7.0)

## 2022-01-11 LAB — CBC
HCT: 44.1 % (ref 36.0–46.0)
Hemoglobin: 14.3 g/dL (ref 12.0–15.0)
MCHC: 32.6 g/dL (ref 30.0–36.0)
MCV: 86.5 fl (ref 78.0–100.0)
Platelets: 236 10*3/uL (ref 150.0–400.0)
RBC: 5.09 Mil/uL (ref 3.87–5.11)
RDW: 15.9 % — ABNORMAL HIGH (ref 11.5–15.5)
WBC: 6.5 10*3/uL (ref 4.0–10.5)

## 2022-01-11 LAB — HEMOGLOBIN A1C: Hgb A1c MFr Bld: 7.1 % — ABNORMAL HIGH (ref 4.6–6.5)

## 2022-01-17 NOTE — Progress Notes (Deleted)
Subjective:    Patient ID: Andrea Calderon, female    DOB: Jul 09, 1946, 76 y.o.   MRN: [id number]  No chief complaint on file.   HPI Patient is in today for her medicare annual exam.  Past Medical History:  Diagnosis Date   BCC (basal cell carcinoma), face 12/05/2014   Removed from right side of nose [facility] Altamese Cabal and Dr Ferdinand Lango   Cancer [facility])    [facility] right nose   Diabetes mellitus    H/O: stroke 09/11/2011   September 2012 acute right retinal artery occlusion H/o left retinal artery embolic event by imaging H/o blood clot in port during chemotherapy    Multiple thyroid nodules 84/01/9628   Oral lichen planus 12/06/8411   Other and unspecified hyperlipidemia 12/16/2012   Preventative health care 06/21/2013   Rosacea    Valvular heart disease 06/13/2012   Cardiologist Dr Darral Dash of [facility] in [facility]    Past Surgical History:  Procedure Laterality Date   ABDOMINAL HYSTERECTOMY  08/07/2007   total   COLECTOMY  08/07/2007   MOHS SURGERY Right    for [facility] on right nose, Feb 2016   PACEMAKER PLACEMENT Left    Valve replacement 9/22   TONGUE BIOPSY  01/05/2015   negative results   VENTRAL HERNIA REPAIR  06/06/2010   4 lesions found, mesh left in place    Family History  Problem Relation Age of Onset   Transient ischemic attack Mother    Hypertension Mother    Dementia Mother    Diabetes Father        borderline sugar   Coronary artery disease Father    Heart disease Maternal Grandfather    Heart disease Paternal Grandfather     Social History   Socioeconomic History   Marital status: Married    Spouse name: Not on file   Number of children: Not on file   Years of education: Not on file   Highest education level: Not on file  Occupational History   Not on file  Tobacco Use   Smoking status: Never   Smokeless tobacco: Never  Vaping Use   Vaping Use: Never used  Substance and Sexual Activity   Alcohol use: Not  Currently    Comment: rare, social   Drug use: No   Sexual activity: Yes  Other Topics Concern   Not on file  Social History Narrative   Not on file   Social Determinants of Health   Financial Resource Strain: Not on file  Food Insecurity: Not on file  Transportation Needs: Not on file  Physical Activity: Not on file  Stress: Not on file  Social Connections: Not on file  Intimate Partner Violence: Not on file    Outpatient Medications Prior to Visit  Medication Sig Dispense Refill   allopurinol (ZYLOPRIM) 100 MG tablet TAKE 1 TABLET BY MOUTH EVERY DAY 90 tablet 1   apixaban (ELIQUIS) 5 MG TABS tablet Take 1 tablet (5 mg total) by mouth 2 (two) times daily. 60 tablet 0   B Complex Vitamins (VITAMIN-B COMPLEX PO) Take 1 tablet by mouth daily. Reported on 12/22/2015     blood glucose meter kit and supplies Dispense based on patient and insurance preference. Use up to rwo times daily as directed. DX: E11.9). Aviva Plus meter 1 each 0   glucose blood (ACCU-CHEK AVIVA PLUS) test strip Check blood sugars twice daily 100 each 12   hydrochlorothiazide (  HYDRODIURIL) 25 MG tablet TAKE 1 TABLET BY MOUTH EVERY DAY 90 tablet 1   hydrocortisone 2.5 % cream Apply topically 2 (two) times daily as needed. 28 g 3   latanoprost (XALATAN) 0.005 % ophthalmic solution Place 1 drop into both eyes daily.  3   metFORMIN (GLUCOPHAGE) 500 MG tablet TAKE 1 TABLET BY MOUTH 2 TIMES DAILY WITH A MEAL. 180 tablet 1   mupirocin ointment (BACTROBAN) 2 % Place 1 application into the nose at bedtime as needed. 22 g 1   OVER THE COUNTER MEDICATION Vita Fusion Fiber- 2 daily     rosuvastatin (CRESTOR) 10 MG tablet TAKE 1 TABLET BY MOUTH EVERY DAY 90 tablet 1   valsartan (DIOVAN) 160 MG tablet Take 1 tablet (160 mg total) by mouth daily. 30 tablet 3   No facility-administered medications prior to visit.    Allergies  Allergen Reactions   Codeine Nausea And Vomiting    ROS     Objective:    Physical  Exam  There were no vitals taken for this visit. Wt Readings from Last 3 Encounters:  09/23/21 150 lb (68 kg)  07/13/21 155 lb 3.2 oz (70.4 kg)  03/30/21 150 lb (68 kg)    Diabetic Foot Exam - Simple   No data filed    Lab Results  Component Value Date   WBC 6.5 01/11/2022   HGB 14.3 01/11/2022   HCT 44.1 01/11/2022   PLT 236.0 01/11/2022   GLUCOSE 130 (H) 01/11/2022   CHOL 159 01/11/2022   TRIG 136.0 01/11/2022   HDL 60.70 01/11/2022   LDLDIRECT 182.4 09/11/2011   LDLCALC 71 01/11/2022   ALT 13 01/11/2022   AST 19 01/11/2022   NA 135 01/11/2022   K 4.5 01/11/2022   CL 99 01/11/2022   CREATININE 0.82 01/11/2022   BUN 16 01/11/2022   CO2 29 01/11/2022   TSH 2.79 01/11/2022   INR 0.9 09/11/2011   HGBA1C 7.1 (H) 01/11/2022    Lab Results  Component Value Date   TSH 2.79 01/11/2022   Lab Results  Component Value Date   WBC 6.5 01/11/2022   HGB 14.3 01/11/2022   HCT 44.1 01/11/2022   MCV 86.5 01/11/2022   PLT 236.0 01/11/2022   Lab Results  Component Value Date   NA 135 01/11/2022   K 4.5 01/11/2022   CO2 29 01/11/2022   GLUCOSE 130 (H) 01/11/2022   BUN 16 01/11/2022   CREATININE 0.82 01/11/2022   BILITOT 0.5 01/11/2022   ALKPHOS 77 01/11/2022   AST 19 01/11/2022   ALT 13 01/11/2022   PROT 7.2 01/11/2022   ALBUMIN 4.3 01/11/2022   CALCIUM 9.8 01/11/2022   ANIONGAP 7 09/23/2021   GFR 69.33 01/11/2022   Lab Results  Component Value Date   CHOL 159 01/11/2022   Lab Results  Component Value Date   HDL 60.70 01/11/2022   Lab Results  Component Value Date   LDLCALC 71 01/11/2022   Lab Results  Component Value Date   TRIG 136.0 01/11/2022   Lab Results  Component Value Date   CHOLHDL 3 01/11/2022   Lab Results  Component Value Date   HGBA1C 7.1 (H) 01/11/2022       Assessment & Plan:   COLONOSCOPY: PAP: PSA: DEXA:   Problem List Items Addressed This Visit   None   I am having Audrie Gallus. Looper maintain her B Complex Vitamins  (VITAMIN-B COMPLEX PO), OVER THE COUNTER MEDICATION, latanoprost, glucose blood, blood glucose  meter kit and supplies, hydrocortisone, hydrochlorothiazide, apixaban, mupirocin ointment, valsartan, allopurinol, rosuvastatin, and metFORMIN.  No orders of the defined types were placed in this encounter.

## 2022-01-18 ENCOUNTER — Ambulatory Visit: Payer: Medicare Other | Admitting: Family Medicine

## 2022-01-18 DIAGNOSIS — Z0000 Encounter for general adult medical examination without abnormal findings: Secondary | ICD-10-CM

## 2022-01-22 ENCOUNTER — Encounter: Payer: Self-pay | Admitting: Family Medicine

## 2022-01-29 ENCOUNTER — Other Ambulatory Visit: Payer: Self-pay | Admitting: Family Medicine

## 2022-01-29 DIAGNOSIS — I10 Essential (primary) hypertension: Secondary | ICD-10-CM

## 2022-03-01 ENCOUNTER — Other Ambulatory Visit: Payer: Self-pay | Admitting: Family Medicine

## 2022-04-16 ENCOUNTER — Telehealth: Payer: Self-pay | Admitting: Family Medicine

## 2022-04-16 ENCOUNTER — Other Ambulatory Visit: Payer: Self-pay | Admitting: Family Medicine

## 2022-04-16 DIAGNOSIS — E1149 Type 2 diabetes mellitus with other diabetic neurological complication: Secondary | ICD-10-CM

## 2022-04-16 DIAGNOSIS — E785 Hyperlipidemia, unspecified: Secondary | ICD-10-CM

## 2022-04-16 DIAGNOSIS — M109 Gout, unspecified: Secondary | ICD-10-CM

## 2022-04-16 DIAGNOSIS — I10 Essential (primary) hypertension: Secondary | ICD-10-CM

## 2022-04-16 NOTE — Telephone Encounter (Signed)
Pt would like to know if she should get labs before her appt. Please advise.

## 2022-04-16 NOTE — Telephone Encounter (Signed)
Called pt Lvm to call our office back to make lab appt Orders are in

## 2022-04-30 ENCOUNTER — Other Ambulatory Visit (INDEPENDENT_AMBULATORY_CARE_PROVIDER_SITE_OTHER): Payer: Medicare Other

## 2022-04-30 DIAGNOSIS — E785 Hyperlipidemia, unspecified: Secondary | ICD-10-CM | POA: Diagnosis not present

## 2022-04-30 DIAGNOSIS — E1149 Type 2 diabetes mellitus with other diabetic neurological complication: Secondary | ICD-10-CM | POA: Diagnosis not present

## 2022-04-30 DIAGNOSIS — I10 Essential (primary) hypertension: Secondary | ICD-10-CM

## 2022-04-30 DIAGNOSIS — M109 Gout, unspecified: Secondary | ICD-10-CM | POA: Diagnosis not present

## 2022-04-30 LAB — LIPID PANEL
Cholesterol: 138 mg/dL (ref 0–200)
HDL: 56.4 mg/dL (ref >39.00)
LDL Cholesterol: 57 mg/dL (ref 0–99)
NonHDL: 81.3
Total CHOL/HDL Ratio: 2
Triglycerides: 120 mg/dL (ref 0.0–149.0)
VLDL: 24 mg/dL (ref 0.0–40.0)

## 2022-04-30 LAB — CBC
HCT: 41.2 % (ref 36.0–46.0)
Hemoglobin: 13.7 g/dL (ref 12.0–15.0)
MCHC: 33.3 g/dL (ref 30.0–36.0)
MCV: 86.7 fl (ref 78.0–100.0)
Platelets: 198 10*3/uL (ref 150.0–400.0)
RBC: 4.75 Mil/uL (ref 3.87–5.11)
RDW: 14.8 % (ref 11.5–15.5)
WBC: 5.9 10*3/uL (ref 4.0–10.5)

## 2022-04-30 LAB — COMPREHENSIVE METABOLIC PANEL
ALT: 14 U/L (ref 0–35)
AST: 21 U/L (ref 0–37)
Albumin: 4.2 g/dL (ref 3.5–5.2)
Alkaline Phosphatase: 71 U/L (ref 39–117)
BUN: 17 mg/dL (ref 6–23)
CO2: 30 mEq/L (ref 19–32)
Calcium: 9.6 mg/dL (ref 8.4–10.5)
Chloride: 95 mEq/L — ABNORMAL LOW (ref 96–112)
Creatinine, Ser: 0.87 mg/dL (ref 0.40–1.20)
GFR: 64.44 mL/min (ref >60.00)
Glucose, Bld: 139 mg/dL — ABNORMAL HIGH (ref 70–99)
Potassium: 4.6 mEq/L (ref 3.5–5.1)
Sodium: 134 mEq/L — ABNORMAL LOW (ref 135–145)
Total Bilirubin: 0.5 mg/dL (ref 0.2–1.2)
Total Protein: 7.2 g/dL (ref 6.0–8.3)

## 2022-04-30 LAB — URIC ACID: Uric Acid, Serum: 6.1 mg/dL (ref 2.4–7.0)

## 2022-04-30 LAB — TSH: TSH: 2.99 u[IU]/mL (ref 0.35–5.50)

## 2022-04-30 LAB — HEMOGLOBIN A1C: Hgb A1c MFr Bld: 7.4 % — ABNORMAL HIGH (ref 4.6–6.5)

## 2022-05-07 ENCOUNTER — Ambulatory Visit: Payer: Medicare Other | Admitting: Family Medicine

## 2022-05-17 ENCOUNTER — Telehealth: Payer: Self-pay | Admitting: Licensed Clinical Social Worker

## 2022-05-17 NOTE — Patient Outreach (Signed)
  Care Coordination   05/17/2022 Name: Andrea Calderon MRN: [id number] DOB: 10-11-1945   Care Coordination Outreach Attempts:  An unsuccessful telephone outreach was attempted today to offer the patient information about available care coordination services as a benefit of their health plan.   Follow Up Plan:  Additional outreach attempts will be made to offer the patient care coordination information and services.   Encounter Outcome:  No Answer  Care Coordination Interventions Activated:  No   Care Coordination Interventions:  No, not indicated    Casimer Lanius, [place] [phone]

## 2022-05-22 ENCOUNTER — Ambulatory Visit (INDEPENDENT_AMBULATORY_CARE_PROVIDER_SITE_OTHER): Payer: Medicare Other | Admitting: Family

## 2022-05-22 ENCOUNTER — Other Ambulatory Visit (HOSPITAL_BASED_OUTPATIENT_CLINIC_OR_DEPARTMENT_OTHER): Payer: Self-pay

## 2022-05-22 ENCOUNTER — Ambulatory Visit: Payer: Medicare Other | Admitting: Family

## 2022-05-22 VITALS — BP 137/47 | HR 57 | Temp 97.5°F | Resp 16 | Ht 62.0 in | Wt 151.8 lb

## 2022-05-22 DIAGNOSIS — E785 Hyperlipidemia, unspecified: Secondary | ICD-10-CM

## 2022-05-22 DIAGNOSIS — E1149 Type 2 diabetes mellitus with other diabetic neurological complication: Secondary | ICD-10-CM | POA: Diagnosis not present

## 2022-05-22 DIAGNOSIS — R059 Cough, unspecified: Secondary | ICD-10-CM

## 2022-05-22 DIAGNOSIS — Z23 Encounter for immunization: Secondary | ICD-10-CM

## 2022-05-22 DIAGNOSIS — M109 Gout, unspecified: Secondary | ICD-10-CM

## 2022-05-22 DIAGNOSIS — I10 Essential (primary) hypertension: Secondary | ICD-10-CM | POA: Diagnosis not present

## 2022-05-22 LAB — MICROALBUMIN / CREATININE URINE RATIO
Creatinine,U: 83 mg/dL
Microalb Creat Ratio: 1.7 mg/g (ref 0.0–30.0)
Microalb, Ur: 1.4 mg/dL (ref 0.0–1.9)

## 2022-05-22 MED ORDER — VALSARTAN 160 MG PO TABS: 80.0000 mg | ORAL_TABLET | Freq: Every day | ORAL | 3 refills | Status: DC

## 2022-05-22 MED ORDER — SHINGRIX 50 MCG/0.5ML IM SUSR
INTRAMUSCULAR | 1 refills | Status: DC
  Filled 2022-05-22: qty 0.5, fill #0
  Filled 2022-11-06: qty 0.5, 1d supply, fill #0

## 2022-05-22 NOTE — Assessment & Plan Note (Signed)
Lab Results  Component Value Date   CHOL 138 04/30/2022   HDL 56.40 04/30/2022   LDLCALC 57 04/30/2022   LDLDIRECT 182.4 09/11/2011   TRIG 120.0 04/30/2022   CHOLHDL 2 04/30/2022   At goal on crestor, continue same.

## 2022-05-22 NOTE — Assessment & Plan Note (Signed)
Lab Results  Component Value Date   HGBA1C 7.4 (H) 04/30/2022   HGBA1C 7.1 (H) 01/11/2022   HGBA1C 6.7 (H) 06/05/2021   Lab Results  Component Value Date   LDLCALC 57 04/30/2022   CREATININE 0.87 04/30/2022   Slightly above goal.  She is focusing on diet/exercise and weight loss.  Continue metformin.

## 2022-05-22 NOTE — Assessment & Plan Note (Addendum)
No recent flares  Monitor.  She continues allopurinol '100mg'$  PO QOD.

## 2022-05-22 NOTE — Assessment & Plan Note (Addendum)
BP Readings from Last 3 Encounters:  05/22/22 (!) 137/47  09/23/21 (!) 142/62  07/13/21 112/60   DBP mildly low and she notes some mild dizziness. Will decrease valsartan from '160mg'$  to '80mg'$  once daily to see if this helps with her dizziness. Continue HCTZ.

## 2022-05-22 NOTE — Patient Instructions (Signed)
Please go to the lab

## 2022-05-22 NOTE — Progress Notes (Signed)
f  Subjective:   By signing my name below, I, Andrea Calderon, attest that this documentation has been prepared under the direction and in the presence of Karie Chimera, NP 05/22/2022    Patient ID: Andrea Calderon, female    DOB: Dec 05, 1945, 76 y.o.   MRN: [id number]  Chief Complaint  Patient presents with   Diabetes    Here for follow up   Hypertension    Here for follow up    HPI Patient is in today for an office visit  Wobbling: She complains of wobbling that begun last week. She denies of any dizziness. She reports loss of sight in her right eye due to a previous inclusion.   A1C: Her A1C levels are increasing. She states that as of this year, she has been having external stressors. She is currently taking 500 Mg of Metformin. She states that she is losing weight and actively exercising.  Lab Results  Component Value Date   HGBA1C 7.4 (H) 04/30/2022   Wt Readings from Last 3 Encounters:  05/22/22 151 lb 12.8 oz (68.9 kg)  09/23/21 150 lb (68 kg)  07/13/21 155 lb 3.2 oz (70.4 kg)   Cough: She reports of a hacking cough. 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 She does not believe that changing the medication improved her symptoms. However, she begun taking vinegar as she believes her cough was due to reflux and reports that her cough is improving  Blood Pressure: Her blood pressure is okay. She is currently taking 25 Mg of Hydrochlorothiazide.  BP Readings from Last 3 Encounters:  05/22/22 (!) 137/47  09/23/21 (!) 142/62  07/13/21 112/60   Pulse Readings from Last 3 Encounters:  05/22/22 (!) 57  09/23/21 61  07/13/21 69   Blood Thinner: She is currently taking 150 Mg of Pradaxa.  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   Gout: She reports that her last gout flare up was last year. She is currently taking 100 Mg  of Allopruinol every other day   Cholesterol: Her cholesterol levels are normal. She is currently taking 10 Mg of Crestor Lab Results  Component Value Date   CHOL 138 04/30/2022   HDL 56.40 04/30/2022   LDLCALC 57 04/30/2022   LDLDIRECT 182.4 09/11/2011   TRIG 120.0 04/30/2022   CHOLHDL 2 04/30/2022   Immunizations: She reports that she has not received a Shingles vaccine.   Health Maintenance Due  Topic Date Due   Diabetic kidney evaluation - Urine ACR  Never done   Zoster Vaccines- Shingrix (1 of 2) Never done   COVID-19 Vaccine (7 - Moderna risk series) 06/26/2021   TETANUS/TDAP  09/10/2021    Past Medical History:  Diagnosis Date   BCC (basal cell carcinoma), face 12/05/2014   Removed from right side of nose [facility] Altamese Cabal and Dr Ferdinand Lango   Cancer [facility])    [facility] right nose   Diabetes mellitus    H/O: stroke 09/11/2011   September 2012 acute right retinal artery occlusion H/o left retinal artery embolic event by imaging H/o blood clot in port during chemotherapy    Multiple thyroid nodules 70/02/8674   Oral lichen planus 11/08/9199   Other and unspecified hyperlipidemia 12/16/2012   Preventative health care 06/21/2013   Rosacea  Valvular heart disease 06/13/2012   Cardiologist Dr Darral Dash of [facility] Cardiology in [facility]    Past Surgical History:  Procedure Laterality Date   ABDOMINAL HYSTERECTOMY  08/07/2007   total   COLECTOMY  08/07/2007   MOHS SURGERY Right    for [facility] on right nose, Feb 2016   PACEMAKER PLACEMENT Left    Valve replacement 9/22   TONGUE BIOPSY  01/05/2015   negative results   VENTRAL HERNIA REPAIR  06/06/2010   4 lesions found, mesh left in place    Family History  Problem Relation Age of Onset   Transient ischemic attack Mother    Hypertension Mother    Dementia Mother    Diabetes Father        borderline sugar   Coronary artery disease Father    Heart disease Maternal Grandfather    Heart disease  Paternal Grandfather     Social History   Socioeconomic History   Marital status: Married    Spouse name: Not on file   Number of children: Not on file   Years of education: Not on file   Highest education level: Not on file  Occupational History   Not on file  Tobacco Use   Smoking status: Never   Smokeless tobacco: Never  Vaping Use   Vaping Use: Never used  Substance and Sexual Activity   Alcohol use: Not Currently    Comment: rare, social   Drug use: No   Sexual activity: Yes  Other Topics Concern   Not on file  Social History Narrative   Not on file   Social Determinants of Health   Financial Resource Strain: Not on file  Food Insecurity: Not on file  Transportation Needs: Not on file  Physical Activity: Not on file  Stress: Not on file  Social Connections: Not on file  Intimate Partner Violence: Not on file    Outpatient Medications Prior to Visit  Medication Sig Dispense Refill   allopurinol (ZYLOPRIM) 100 MG tablet TAKE 1 TABLET BY MOUTH EVERY DAY 90 tablet 1   B Complex Vitamins (VITAMIN-B COMPLEX PO) Take 1 tablet by mouth daily. Reported on 12/22/2015     blood glucose meter kit and supplies Dispense based on patient and insurance preference. Use up to rwo times daily as directed. DX: E11.9). Aviva Plus meter 1 each 0   dabigatran (PRADAXA) 150 MG CAPS capsule Take 1 capsule by mouth 2 (two) times daily.     flecainide (TAMBOCOR) 50 MG tablet Take by mouth.     flecainide (TAMBOCOR) 50 MG tablet Take 50 mg by mouth 2 (two) times daily.     glucose blood (ACCU-CHEK AVIVA PLUS) test strip Check blood sugars twice daily 100 each 12   hydrochlorothiazide (HYDRODIURIL) 25 MG tablet TAKE 1 TABLET BY MOUTH EVERY DAY 90 tablet 1   hydrocortisone 2.5 % cream Apply topically 2 (two) times daily as needed. 28 g 3   latanoprost (XALATAN) 0.005 % ophthalmic solution Place 1 drop into both eyes daily.  3   metFORMIN (GLUCOPHAGE) 500 MG tablet TAKE 1 TABLET BY MOUTH 2  TIMES DAILY WITH A MEAL. 180 tablet 1   mupirocin ointment (BACTROBAN) 2 % Place 1 application into the nose at bedtime as needed. 22 g 1   OVER THE COUNTER MEDICATION Vita Fusion Fiber- 2 daily     rosuvastatin (CRESTOR) 10 MG tablet TAKE 1 TABLET BY MOUTH EVERY DAY 90 tablet 1   valsartan (DIOVAN) 160 MG  tablet TAKE ONE TABLET BY MOUTH DAILY 30 tablet 3   apixaban (ELIQUIS) 5 MG TABS tablet Take 1 tablet (5 mg total) by mouth 2 (two) times daily. 60 tablet 0   No facility-administered medications prior to visit.    Allergies  Allergen Reactions   Codeine Nausea And Vomiting    Review of Systems  Neurological:  Negative for dizziness.       (+) Wobbling       Objective:    Physical Exam Constitutional:      General: She is not in acute distress.    Appearance: Normal appearance. She is not ill-appearing.  HENT:     Head: Normocephalic and atraumatic.     Right Ear: External ear normal.     Left Ear: External ear normal.  Eyes:     Extraocular Movements: Extraocular movements intact.     Pupils: Pupils are equal, round, and reactive to light.  Cardiovascular:     Rate and Rhythm: Normal rate and regular rhythm.     Pulses:          Dorsalis pedis pulses are 2+ on the right side and 2+ on the left side.       Posterior tibial pulses are 2+ on the right side and 2+ on the left side.     Heart sounds: Murmur heard.     Systolic murmur is present with a grade of 2/6.     No gallop.  Pulmonary:     Effort: Pulmonary effort is normal. No respiratory distress.     Breath sounds: Normal breath sounds. No wheezing or rales.  Skin:    General: Skin is warm and dry.  Neurological:     Mental Status: She is alert and oriented to person, place, and time.     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        Behavior: Behavior normal.        Judgment: Judgment normal.      BP (!) 137/47 (BP Location: Right Arm, Patient Position: Sitting, Cuff Size: Small)   Pulse (!) 57   Temp (!) 97.5 F (36.4 C) (Oral)   Resp 16   Ht 5' 2"  (1.575 m)   Wt 151 lb 12.8 oz (68.9 kg)   SpO2 99%   BMI 27.76 kg/m  Wt Readings from Last 3 Encounters:  05/22/22 151 lb 12.8 oz (68.9 kg)  09/23/21 150 lb (68 kg)  07/13/21 155 lb 3.2 oz (70.4 kg)   Diabetic Foot Exam - Simple   Simple Foot Form Diabetic Foot exam was performed with the following findings: Yes 05/22/2022  9:31 AM  Visual Inspection No deformities, no ulcerations, no other skin breakdown bilaterally: Yes Sensation Testing Intact to touch and monofilament testing bilaterally: Yes Pulse Check Posterior Tibialis and Dorsalis pulse intact bilaterally: Yes Comments        Assessment & Plan:   Problem List Items Addressed This Visit       Unprioritized   Hyperlipidemia    Lab Results  Component Value Date   CHOL 138 04/30/2022   HDL 56.40 04/30/2022   LDLCALC 57 04/30/2022   LDLDIRECT 182.4 09/11/2011   TRIG 120.0 04/30/2022   CHOLHDL 2 04/30/2022  At goal on crestor, continue same.       Relevant Medications   dabigatran (PRADAXA)  150 MG CAPS capsule   flecainide (TAMBOCOR) 50 MG tablet   flecainide (TAMBOCOR) 50 MG tablet   valsartan (DIOVAN) 160 MG tablet   GOUT, UNSPECIFIED    No recent flares  Monitor.  She continues allopurinol 172m PO QOD.      ESSENTIAL HYPERTENSION, BENIGN    BP Readings from Last 3 Encounters:  05/22/22 (!) 137/47  09/23/21 (!) 142/62  07/13/21 112/60  DBP mildly low and she notes some mild dizziness. Will decrease valsartan from 1657mto 8015mnce daily to see if this helps with her dizziness. Continue HCTZ.       Relevant Medications   dabigatran (PRADAXA) 150 MG CAPS capsule   flecainide (TAMBOCOR) 50 MG tablet   flecainide (TAMBOCOR) 50 MG tablet   valsartan (DIOVAN) 160 MG tablet   DM (diabetes mellitus), type 2 with neurological complications  (HCC)    Lab Results  Component Value Date   HGBA1C 7.4 (H) 04/30/2022   HGBA1C 7.1 (H) 01/11/2022   HGBA1C 6.7 (H) 06/05/2021   Lab Results  Component Value Date   LDLCALC 57 04/30/2022   CREATININE 0.87 04/30/2022  Slightly above goal.  She is focusing on diet/exercise and weight loss.  Continue metformin.       Relevant Medications   valsartan (DIOVAN) 160 MG tablet   Other Relevant Orders   Urine Microalbumin w/creat. ratio   Cough    Improved with work on gerd precautions.  Monitor.       Other Visit Diagnoses     Needs flu shot    -  Primary   Relevant Orders   Flu Vaccine QUAD High Dose(Fluad) (Completed)      Meds ordered this encounter  Medications   Z[facility] Vacc[facility]juvant[facility]    Sig: Inject 0.5mg34m now and again in 2-6 monts.    Dispense:  0.5 mL    Refill:  1    Order Specific Question:   Supervising Provider    Answer:   BLYTPenni [id number]]   valsartan (DIOVAN) 160 MG tablet    Sig: Take 0.5 tablets (80 mg total) by mouth daily.    Dispense:  30 tablet    Refill:  3    Order Specific Question:   Supervising Provider    Answer:   BLYTPenni [id number]]    I, MeliNance Pear, personally preformed the services described in this documentation.  All medical record entries made by the scribe were at my direction and in my presence.  I have reviewed the chart and discharge instructions (if applicable) and agree that the record reflects my personal performance and is accurate and complete. 05/22/2022   I,Amber Collins,acting as a scribe for MeliNance Pear.,have documented all relevant documentation on the behalf of MeliNance Pear,as directed by  MeliNance Pear while in the presence of MeliNance Pear.    MeliNance Pear

## 2022-05-22 NOTE — Assessment & Plan Note (Signed)
Improved with work on gerd precautions.  Monitor.

## 2022-06-05 ENCOUNTER — Ambulatory Visit: Payer: Medicare Other

## 2022-06-07 ENCOUNTER — Ambulatory Visit: Payer: Medicare Other

## 2022-06-14 ENCOUNTER — Ambulatory Visit: Payer: Medicare Other

## 2022-06-14 DIAGNOSIS — I10 Essential (primary) hypertension: Secondary | ICD-10-CM

## 2022-06-14 NOTE — Progress Notes (Signed)
BP Readings from Last 3 Encounters:  05/22/22 (!) 137/47  09/23/21 (!) 142/62  07/13/21 112/60   Patient here for BP check after medication changed at last ov:  DBP mildly low and she notes some mild dizziness. Will decrease valsartan from '160mg'$  to '80mg'$  once daily to see if this helps with her dizziness. Continue HCTZ.   BP today is 120/60.  Per Dr. Charlett Blake ok to continue to take 80 mg of valsartan with the HCTZ and to follow up in April as scheduled.

## 2022-06-16 ENCOUNTER — Other Ambulatory Visit: Payer: Self-pay | Admitting: Family Medicine

## 2022-06-17 ENCOUNTER — Other Ambulatory Visit: Payer: Self-pay | Admitting: Family Medicine

## 2022-06-19 ENCOUNTER — Encounter: Payer: Self-pay | Admitting: Family Medicine

## 2022-06-20 ENCOUNTER — Other Ambulatory Visit: Payer: Self-pay

## 2022-06-20 DIAGNOSIS — I10 Essential (primary) hypertension: Secondary | ICD-10-CM

## 2022-06-20 MED ORDER — VALSARTAN 160 MG PO TABS: 80.0000 mg | ORAL_TABLET | Freq: Every day | ORAL | 3 refills | Status: DC

## 2022-06-20 NOTE — Telephone Encounter (Signed)
Pt is still needing this refills.    Andrea Calderon [place], [place], [place] [address]

## 2022-06-20 NOTE — Telephone Encounter (Signed)
Medication sent.

## 2022-06-25 ENCOUNTER — Other Ambulatory Visit: Payer: Self-pay

## 2022-06-25 DIAGNOSIS — I10 Essential (primary) hypertension: Secondary | ICD-10-CM

## 2022-06-25 MED ORDER — VALSARTAN 160 MG PO TABS: 80.0000 mg | ORAL_TABLET | Freq: Every day | ORAL | 3 refills | Status: DC

## 2022-06-27 ENCOUNTER — Other Ambulatory Visit: Payer: Self-pay

## 2022-06-27 DIAGNOSIS — I10 Essential (primary) hypertension: Secondary | ICD-10-CM

## 2022-06-27 MED ORDER — VALSARTAN 160 MG PO TABS: 80.0000 mg | ORAL_TABLET | Freq: Every day | ORAL | 3 refills | Status: DC

## 2022-06-27 NOTE — Telephone Encounter (Signed)
Medication sent.

## 2022-06-27 NOTE — Telephone Encounter (Signed)
Pt only has a couple pills remaining. Looks like refill did not get sent to the pharmacy.   valsartan (DIOVAN) 160 MG tablet  [facility] [address] [place], [place], [place] [address] [phone]

## 2022-06-29 ENCOUNTER — Encounter: Payer: Self-pay | Admitting: Family Medicine

## 2022-06-29 DIAGNOSIS — I10 Essential (primary) hypertension: Secondary | ICD-10-CM

## 2022-07-02 ENCOUNTER — Encounter: Payer: Self-pay | Admitting: Family Medicine

## 2022-07-02 MED ORDER — VALSARTAN 160 MG PO TABS: 80.0000 mg | ORAL_TABLET | Freq: Every day | ORAL | 3 refills | Status: DC

## 2022-07-02 NOTE — Addendum Note (Signed)
Addended by: Jeronimo Greaves on: 07/02/2022 08:45 AM   Modules accepted: Orders

## 2022-07-02 NOTE — Telephone Encounter (Signed)
Spoke with Pt to verify correct pharmacy, Pt stated she is on her way up to the office to pick up Rx at front desk.

## 2022-07-02 NOTE — Telephone Encounter (Signed)
Patient states she has been trying to get her valsartan filled for two weeks and she still does not have it. She stated she would be here in 30 minutes to pick up the script. Explained to patient that I would send a message to let staff know but to wait for a call to know when the script is ready to be picked up. Patient stated she would wait for a call in the next 30 minutes. Please advise.

## 2022-07-12 ENCOUNTER — Encounter: Payer: Self-pay | Admitting: Family Medicine

## 2022-08-11 IMAGING — DX DG CHEST 1V PORT
1 series · 1 of 1 positions shown · non-contrast
Comparison: 03/14/2013

CLINICAL DATA: Tachycardia.

EXAM:
PORTABLE CHEST 1 VIEW

[chest ap]
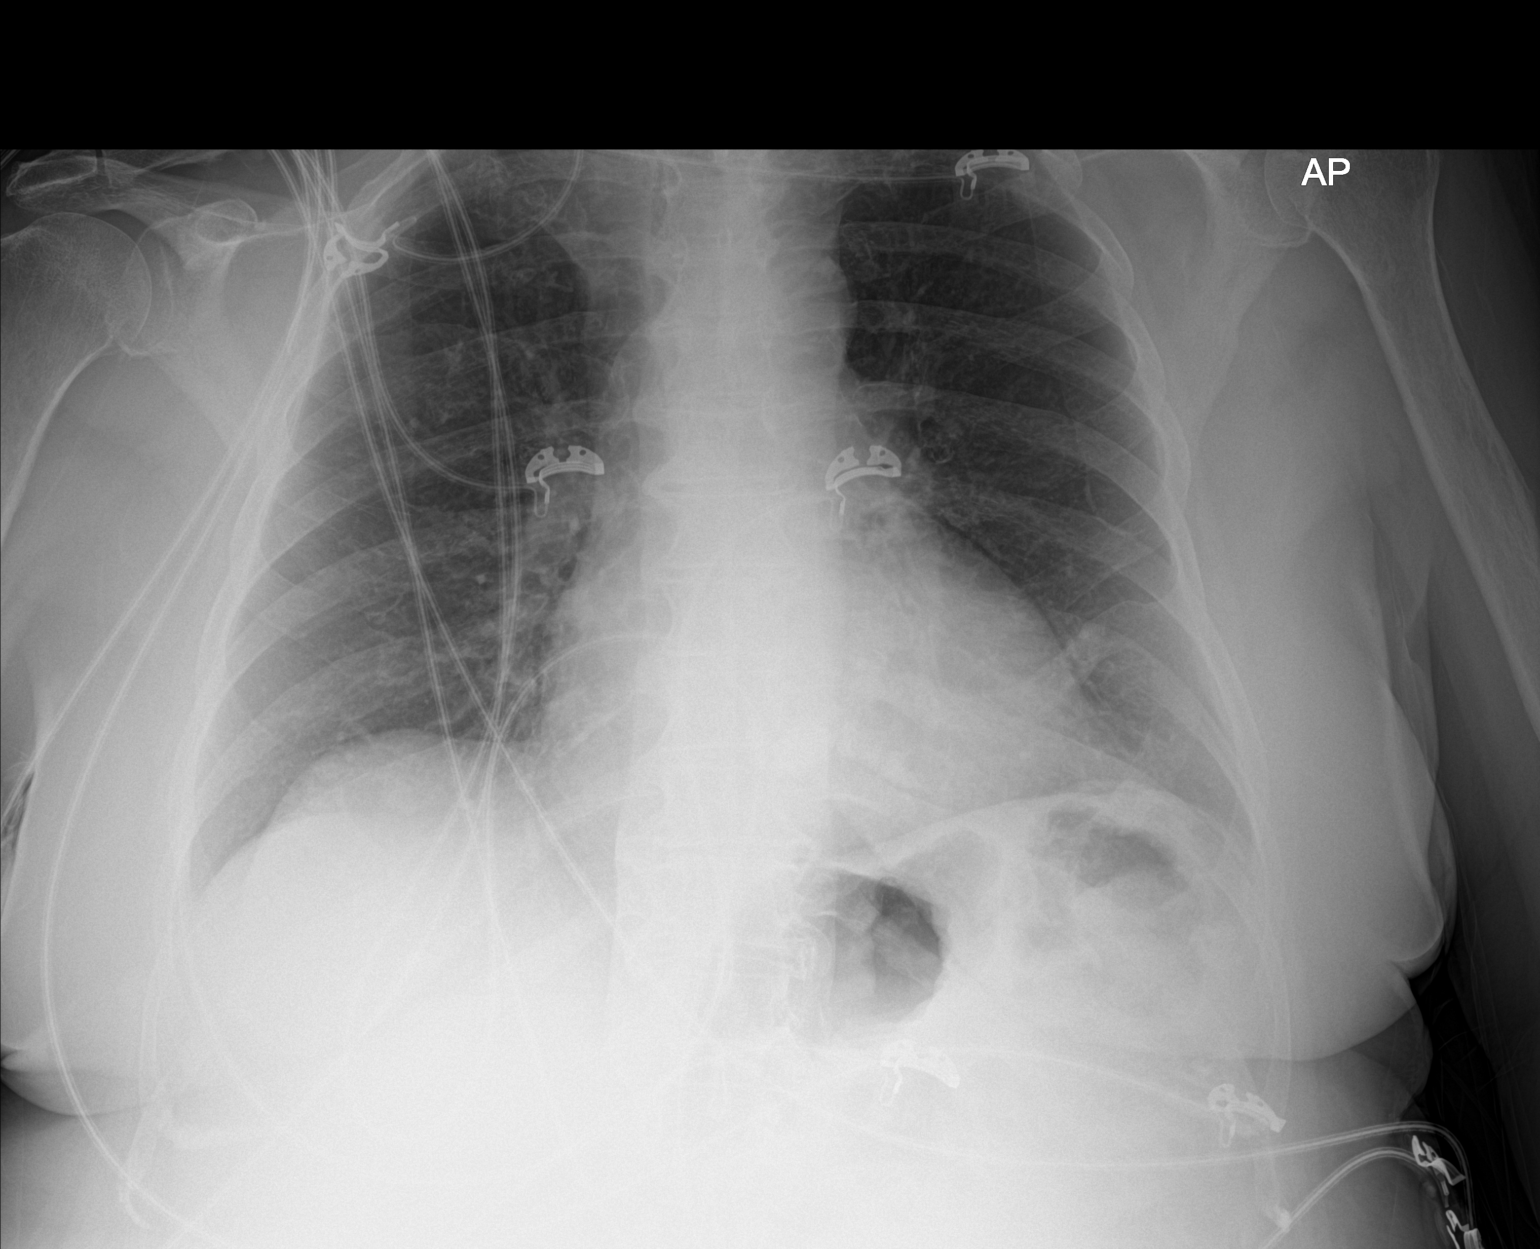

[1 of 1 positions shown; findings below may reference images not displayed]

FINDINGS: The cardiac silhouette, mediastinal and hilar contours are within
normal limits given the AP projection and portable technique.

The lungs are clear.  No pleural effusion or pulmonary lesions.

The bony thorax is intact. Bifid 6 left anterior rib is again noted.
IMPRESSION: No acute cardiopulmonary findings.

## 2022-09-07 ENCOUNTER — Other Ambulatory Visit: Payer: Self-pay | Admitting: Family Medicine

## 2022-10-22 LAB — HM DIABETES EYE EXAM

## 2022-10-30 ENCOUNTER — Other Ambulatory Visit: Payer: Self-pay

## 2022-10-30 ENCOUNTER — Telehealth: Payer: Self-pay

## 2022-10-30 ENCOUNTER — Telehealth: Payer: Self-pay | Admitting: Family Medicine

## 2022-10-30 DIAGNOSIS — E785 Hyperlipidemia, unspecified: Secondary | ICD-10-CM

## 2022-10-30 DIAGNOSIS — I10 Essential (primary) hypertension: Secondary | ICD-10-CM

## 2022-10-30 DIAGNOSIS — E1149 Type 2 diabetes mellitus with other diabetic neurological complication: Secondary | ICD-10-CM

## 2022-10-30 NOTE — Telephone Encounter (Signed)
Labs ordered sent pt message to call to make  Lab appt.

## 2022-10-30 NOTE — Telephone Encounter (Signed)
done

## 2022-10-30 NOTE — Telephone Encounter (Signed)
Pt was wondering if lab orders could be put in so she could come in Thursday and get them done before her appt 4/2.

## 2022-11-01 ENCOUNTER — Other Ambulatory Visit (INDEPENDENT_AMBULATORY_CARE_PROVIDER_SITE_OTHER): Payer: Medicare Other

## 2022-11-01 DIAGNOSIS — E785 Hyperlipidemia, unspecified: Secondary | ICD-10-CM

## 2022-11-01 DIAGNOSIS — E1149 Type 2 diabetes mellitus with other diabetic neurological complication: Secondary | ICD-10-CM | POA: Diagnosis not present

## 2022-11-01 DIAGNOSIS — I10 Essential (primary) hypertension: Secondary | ICD-10-CM

## 2022-11-01 LAB — LIPID PANEL
Cholesterol: 145 mg/dL (ref 0–200)
HDL: 65.3 mg/dL (ref >39.00)
LDL Cholesterol: 61 mg/dL (ref 0–99)
NonHDL: 79.24
Total CHOL/HDL Ratio: 2
Triglycerides: 93 mg/dL (ref 0.0–149.0)
VLDL: 18.6 mg/dL (ref 0.0–40.0)

## 2022-11-01 LAB — COMPREHENSIVE METABOLIC PANEL
ALT: 16 U/L (ref 0–35)
AST: 23 U/L (ref 0–37)
Albumin: 4.4 g/dL (ref 3.5–5.2)
Alkaline Phosphatase: 85 U/L (ref 39–117)
BUN: 20 mg/dL (ref 6–23)
CO2: 29 mEq/L (ref 19–32)
Calcium: 9.8 mg/dL (ref 8.4–10.5)
Chloride: 98 mEq/L (ref 96–112)
Creatinine, Ser: 0.83 mg/dL (ref 0.40–1.20)
GFR: 67.94 mL/min (ref >60.00)
Glucose, Bld: 127 mg/dL — ABNORMAL HIGH (ref 70–99)
Potassium: 4.8 mEq/L (ref 3.5–5.1)
Sodium: 135 mEq/L (ref 135–145)
Total Bilirubin: 0.4 mg/dL (ref 0.2–1.2)
Total Protein: 7.2 g/dL (ref 6.0–8.3)

## 2022-11-01 LAB — CBC WITH DIFFERENTIAL/PLATELET
Basophils Absolute: 0 10*3/uL (ref 0.0–0.1)
Basophils Relative: 0.7 % (ref 0.0–3.0)
Eosinophils Absolute: 0.1 10*3/uL (ref 0.0–0.7)
Eosinophils Relative: 1.6 % (ref 0.0–5.0)
HCT: 43.1 % (ref 36.0–46.0)
Hemoglobin: 14.5 g/dL (ref 12.0–15.0)
Lymphocytes Relative: 27.8 % (ref 12.0–46.0)
Lymphs Abs: 1.7 10*3/uL (ref 0.7–4.0)
MCHC: 33.6 g/dL (ref 30.0–36.0)
MCV: 88.8 fl (ref 78.0–100.0)
Monocytes Absolute: 0.5 10*3/uL (ref 0.1–1.0)
Monocytes Relative: 7.9 % (ref 3.0–12.0)
Neutro Abs: 3.7 10*3/uL (ref 1.4–7.7)
Neutrophils Relative %: 62 % (ref 43.0–77.0)
Platelets: 188 10*3/uL (ref 150.0–400.0)
RBC: 4.85 Mil/uL (ref 3.87–5.11)
RDW: 14.6 % (ref 11.5–15.5)
WBC: 5.9 10*3/uL (ref 4.0–10.5)

## 2022-11-01 LAB — TSH: TSH: 3.29 u[IU]/mL (ref 0.35–5.50)

## 2022-11-01 LAB — HEMOGLOBIN A1C: Hgb A1c MFr Bld: 7 % — ABNORMAL HIGH (ref 4.6–6.5)

## 2022-11-05 NOTE — Assessment & Plan Note (Signed)
Hydrate and monitor taking Allopurinol qod

## 2022-11-05 NOTE — Assessment & Plan Note (Signed)
hgba1c acceptable, minimize simple carbs. Increase exercise as tolerated. Continue current meds 

## 2022-11-05 NOTE — Assessment & Plan Note (Signed)
Encourage heart healthy diet such as MIND or DASH diet, increase exercise, avoid trans fats, simple carbohydrates and processed foods, consider a krill or fish or flaxseed oil cap daily. Tolerating Rosuvastatin 

## 2022-11-05 NOTE — Assessment & Plan Note (Addendum)
Well controlled, no changes to meds. Encouraged heart healthy diet such as the DASH diet and exercise as tolerated. Taking HCTZ QOD

## 2022-11-06 ENCOUNTER — Telehealth: Payer: Self-pay | Admitting: Family Medicine

## 2022-11-06 ENCOUNTER — Other Ambulatory Visit (HOSPITAL_BASED_OUTPATIENT_CLINIC_OR_DEPARTMENT_OTHER): Payer: Self-pay

## 2022-11-06 ENCOUNTER — Ambulatory Visit (INDEPENDENT_AMBULATORY_CARE_PROVIDER_SITE_OTHER): Payer: Medicare Other | Admitting: Family Medicine

## 2022-11-06 VITALS — BP 124/68 | HR 67 | Temp 97.4°F | Resp 16 | Ht 62.0 in | Wt 146.4 lb

## 2022-11-06 DIAGNOSIS — I10 Essential (primary) hypertension: Secondary | ICD-10-CM | POA: Diagnosis not present

## 2022-11-06 DIAGNOSIS — E1149 Type 2 diabetes mellitus with other diabetic neurological complication: Secondary | ICD-10-CM | POA: Diagnosis not present

## 2022-11-06 DIAGNOSIS — M109 Gout, unspecified: Secondary | ICD-10-CM

## 2022-11-06 DIAGNOSIS — E785 Hyperlipidemia, unspecified: Secondary | ICD-10-CM

## 2022-11-06 NOTE — Telephone Encounter (Signed)
Lab order has been placed.  

## 2022-11-06 NOTE — Patient Instructions (Addendum)
Shingrix is the new shingles shot, 2 shots over 2-6 months, confirm coverage with insurance and document, then can return here for shots with nurse appt or at pharmacy   Tetanus  Encouraged increased hydration and fiber in diet. Daily probiotics. If bowels not moving can use Milk Of Magnesi 2 tbls po in 4 oz of warm prune juice by mouth every 2-3 days. If no results then repeat in 4 hours with  Dulcolax suppository pr, may repeat again in 4 more hours as needed. Seek care if symptoms worsen. Consider daily Miralax and/or Dulcolax if symptoms persist.    Constipation, Adult Constipation is when a person has fewer than three bowel movements in a week, has difficulty having a bowel movement, or has stools (feces) that are dry, hard, or larger than normal. Constipation may be caused by an underlying condition. It may become worse with age if a person takes certain medicines and does not take in enough fluids. Follow these instructions at home: Eating and drinking  Eat foods that have a lot of fiber, such as beans, whole grains, and fresh fruits and vegetables. Limit foods that are low in fiber and high in fat and processed sugars, such as fried or sweet foods. These include french fries, hamburgers, cookies, candies, and soda. Drink enough fluid to keep your urine pale yellow. General instructions Exercise regularly or as told by your health care provider. Try to do 150 minutes of moderate exercise each week. Use the bathroom when you have the urge to go. Do not hold it in. Take over-the-counter and prescription medicines only as told by your health care provider. This includes any fiber supplements. During bowel movements: Practice deep breathing while relaxing the lower abdomen. Practice pelvic floor relaxation. Watch your condition for any changes. Let your health care provider know about them. Keep all follow-up visits as told by your health care provider. This is important. Contact a health  care provider if: You have pain that gets worse. You have a fever. You do not have a bowel movement after 4 days. You vomit. You are not hungry or you lose weight. You are bleeding from the opening between the buttocks (anus). You have thin, pencil-like stools. Get help right away if: You have a fever and your symptoms suddenly get worse. You leak stool or have blood in your stool. Your abdomen is bloated. You have severe pain in your abdomen. You feel dizzy or you faint. Summary Constipation is when a person has fewer than three bowel movements in a week, has difficulty having a bowel movement, or has stools (feces) that are dry, hard, or larger than normal. Eat foods that have a lot of fiber, such as beans, whole grains, and fresh fruits and vegetables. Drink enough fluid to keep your urine pale yellow. Take over-the-counter and prescription medicines only as told by your health care provider. This includes any fiber supplements. This information is not intended to replace advice given to you by your health care provider. Make sure you discuss any questions you have with your health care provider. Document Revised: 06/06/2022 Document Reviewed: 06/06/2022 Elsevier Patient Education  [place].

## 2022-11-06 NOTE — Telephone Encounter (Signed)
Pt needs orders for labs 10/10.

## 2022-11-06 NOTE — Progress Notes (Signed)
Subjective:   By signing my name below, I, Andrea Calderon, attest that this documentation has been prepared under the direction and in the presence of Andrea Lukes, MD., 11/06/2022.   Patient ID: Andrea Calderon, female    DOB: 1946/06/02, 77 y.o.   MRN: [id number]  Chief Complaint  Patient presents with   Follow-up    Follow up   HPI Patient is in today for an office visit.   Immunizations Patient is interested in receiving Shingles and Tetanus vaccinations.  Constipation Patient has history of colon cancer and reports that she is having bowel movements every 3-4 days. She is occasionally having bowel movements every 4-5 days and is using otc laxatives to manage this.  She denies nausea/vomiting/diarrhea/ abdominal pain/blood in stool.  Gout Patient reports that her gout is well-controlled with Allopurinol 100 mg which she takes every other day.  Lab Results  Component Value Date   LABURIC 6.1 04/30/2022   Pacemaker Implant Patient reports that she has been doing well since her leadless pacemaker implant in 04/20/2021. She currently takes Valsartan 80 mg and Hydrochlorothiazide 25 mg daily which are tolerable. Additionally, she is currently enrolled in the weight watchers program and has been exercising regularly. Body mass index is 26.78 kg/m. Blood pressure and pulse are normal today. She denies chest pain/palpitations/SOB. Wt Readings from Last 3 Encounters:  11/06/22 146 lb 6.4 oz (66.4 kg)  05/22/22 151 lb 12.8 oz (68.9 kg)  09/23/21 150 lb (68 kg)   BP Readings from Last 3 Encounters:  11/06/22 124/68  05/22/22 (!) 137/47  09/23/21 (!) 142/62   Pulse Readings from Last 3 Encounters:  11/06/22 67  05/22/22 (!) 57  09/23/21 61   Past Medical History:  Diagnosis Date   BCC (basal cell carcinoma), face 12/05/2014   Removed from right side of nose [facility] Altamese Cabal and Dr Ferdinand Lango   Cancer [facility])    [facility] right nose   Diabetes  mellitus    H/O: stroke 09/11/2011   September 2012 acute right retinal artery occlusion H/o left retinal artery embolic event by imaging H/o blood clot in port during chemotherapy    Multiple thyroid nodules 99991111   Oral lichen planus [id number]   Other and unspecified hyperlipidemia 12/16/2012   Preventative health care 06/21/2013   Rosacea    Valvular heart disease 06/13/2012   Cardiologist Dr Darral Dash of [facility] in [facility]   Past Surgical History:  Procedure Laterality Date   ABDOMINAL HYSTERECTOMY  08/07/2007   total   COLECTOMY  08/07/2007   MOHS SURGERY Right    for [facility] on right nose, Feb 2016   PACEMAKER PLACEMENT Left    Valve replacement 9/22   TONGUE BIOPSY  01/05/2015   negative results   VENTRAL HERNIA REPAIR  06/06/2010   4 lesions found, mesh left in place   Family History  Problem Relation Age of Onset   Transient ischemic attack Mother    Hypertension Mother    Dementia Mother    Diabetes Father        borderline sugar   Coronary artery disease Father    Heart disease Maternal Grandfather    Heart disease Paternal Grandfather    Social History   Socioeconomic History   Marital status: Married    Spouse name: Not on file   Number of children: Not on file   Years of education: Not on file   Highest education  level: 12th grade  Occupational History   Not on file  Tobacco Use   Smoking status: Never   Smokeless tobacco: Never  Vaping Use   Vaping Use: Never used  Substance and Sexual Activity   Alcohol use: Not Currently    Comment: rare, social   Drug use: No   Sexual activity: Yes  Other Topics Concern   Not on file  Social History Narrative   Not on file   Social Determinants of Health   Financial Resource Strain: Low Risk  (10/30/2022)   Overall Financial Resource Strain (CARDIA)    Difficulty of Paying Living Expenses: Not hard at all  Food Insecurity: No Food Insecurity (10/30/2022)   Hunger Vital Sign    Worried About  Running Out of Food in the Last Year: Never true    Ran Out of Food in the Last Year: Never true  Transportation Needs: No Transportation Needs (10/30/2022)   PRAPARE - Hydrologist (Medical): No    Lack of Transportation (Non-Medical): No  Physical Activity: Sufficiently Active (10/30/2022)   Exercise Vital Sign    Days of Exercise per Week: 4 days    Minutes of Exercise per Session: 60 min  Stress: No Stress Concern Present (10/30/2022)   Larose    Feeling of Stress : Only a little  Social Connections: Unknown (10/30/2022)   Social Connection and Isolation Panel [NHANES]    Frequency of Communication with Friends and Family: Once a week    Frequency of Social Gatherings with Friends and Family: Twice a week    Attends Religious Services: Never    Printmaker: Not on file    Attends Archivist Meetings: Not on file    Marital Status: Married  Human resources officer Violence: Not on file    Outpatient Medications Prior to Visit  Medication Sig Dispense Refill   allopurinol (ZYLOPRIM) 100 MG tablet TAKE 1 TABLET BY MOUTH EVERY DAY 90 tablet 1   B Complex Vitamins (VITAMIN-B COMPLEX PO) Take 1 tablet by mouth daily. Reported on 12/22/2015     blood glucose meter kit and supplies Dispense based on patient and insurance preference. Use up to rwo times daily as directed. DX: E11.9). Aviva Plus meter 1 each 0   flecainide (TAMBOCOR) 50 MG tablet Take 50 mg by mouth 2 (two) times daily.     glucose blood (ACCU-CHEK AVIVA PLUS) test strip Check blood sugars twice daily 100 each 12   hydrochlorothiazide (HYDRODIURIL) 25 MG tablet TAKE 1 TABLET BY MOUTH EVERY DAY 90 tablet 1   hydrocortisone 2.5 % cream Apply topically 2 (two) times daily as needed. 28 g 3   latanoprost (XALATAN) 0.005 % ophthalmic solution Place 1 drop into both eyes daily.  3   metFORMIN (GLUCOPHAGE)  500 MG tablet Take 1 tablet (500 mg total) by mouth 2 (two) times daily with a meal. 180 tablet 1   OVER THE COUNTER MEDICATION Vita Fusion Fiber- 2 daily     rosuvastatin (CRESTOR) 10 MG tablet TAKE 1 TABLET BY MOUTH EVERY DAY 90 tablet 1   valsartan (DIOVAN) 160 MG tablet Take 0.5 tablets (80 mg total) by mouth daily. 30 tablet 3   Zoster Vaccine Adjuvanted [facility]) injection Inject 0.5mg  IM now and again in 2-6 months. 0.5 mL 1   flecainide (TAMBOCOR) 50 MG tablet Take by mouth.     mupirocin ointment (BACTROBAN)  2 % Place 1 application into the nose at bedtime as needed. 22 g 1   dabigatran (PRADAXA) 150 MG CAPS capsule Take 1 capsule by mouth 2 (two) times daily.     No facility-administered medications prior to visit.    Allergies  Allergen Reactions   Codeine Nausea And Vomiting    Review of Systems  Constitutional:  Negative for chills and fever.  Respiratory:  Negative for shortness of breath.   Cardiovascular:  Negative for chest pain and palpitations.  Gastrointestinal:  Positive for constipation. Negative for abdominal pain, blood in stool, diarrhea, nausea and vomiting.  Genitourinary:  Negative for dysuria, frequency, hematuria and urgency.  Skin:                Objective:    Physical Exam Constitutional:      General: She is not in acute distress.    Appearance: Normal appearance. She is normal weight. She is not ill-appearing.  HENT:     Head: Normocephalic and atraumatic.     Right Ear: External ear normal.     Left Ear: External ear normal.     Nose: Nose normal.     Mouth/Throat:     Mouth: Mucous membranes are moist.     Pharynx: Oropharynx is clear.  Eyes:     General:        Right eye: No discharge.        Left eye: No discharge.     Extraocular Movements: Extraocular movements intact.     Conjunctiva/sclera: Conjunctivae normal.     Pupils: Pupils are equal, round, and reactive to light.  Cardiovascular:     Rate and Rhythm: Normal rate and  regular rhythm.     Pulses: Normal pulses.     Heart sounds: Normal heart sounds. No murmur heard.    No gallop.  Pulmonary:     Effort: Pulmonary effort is normal. No respiratory distress.     Breath sounds: Normal breath sounds. No wheezing or rales.  Abdominal:     General: Bowel sounds are normal.     Palpations: Abdomen is soft.     Tenderness: There is no abdominal tenderness. There is no guarding.  Musculoskeletal:        General: Normal range of motion.     Cervical back: Normal range of motion.     Right lower leg: No edema.     Left lower leg: No edema.  Skin:    General: Skin is warm and dry.  Neurological:     Mental Status: She is alert and oriented to person, place, and time.  Psychiatric:        Mood and Affect: Mood normal.        Behavior: Behavior normal.        Judgment: Judgment normal.     BP 124/68 (BP Location: Right Arm, Patient Position: Sitting, Cuff Size: Normal)   Pulse 67   Temp (!) 97.4 F (36.3 C) (Oral)   Resp 16   Ht 5\' 2"  (1.575 m)   Wt 146 lb 6.4 oz (66.4 kg)   SpO2 96%   BMI 26.78 kg/m  Wt Readings from Last 3 Encounters:  11/06/22 146 lb 6.4 oz (66.4 kg)  05/22/22 151 lb 12.8 oz (68.9 kg)  09/23/21 150 lb (68 kg)    Diabetic Foot Exam - Simple   No data filed    Lab Results  Component Value Date   WBC 5.9 11/01/2022   HGB  14.5 11/01/2022   HCT 43.1 11/01/2022   PLT 188.0 11/01/2022   GLUCOSE 127 (H) 11/01/2022   CHOL 145 11/01/2022   TRIG 93.0 11/01/2022   HDL 65.30 11/01/2022   LDLDIRECT 182.4 09/11/2011   LDLCALC 61 11/01/2022   ALT 16 11/01/2022   AST 23 11/01/2022   NA 135 11/01/2022   K 4.8 11/01/2022   CL 98 11/01/2022   CREATININE 0.83 11/01/2022   BUN 20 11/01/2022   CO2 29 11/01/2022   TSH 3.29 11/01/2022   INR 0.9 09/11/2011   HGBA1C 7.0 (H) 11/01/2022   MICROALBUR 1.4 05/22/2022    Lab Results  Component Value Date   TSH 3.29 11/01/2022   Lab Results  Component Value Date   WBC 5.9  11/01/2022   HGB 14.5 11/01/2022   HCT 43.1 11/01/2022   MCV 88.8 11/01/2022   PLT 188.0 11/01/2022   Lab Results  Component Value Date   NA 135 11/01/2022   K 4.8 11/01/2022   CO2 29 11/01/2022   GLUCOSE 127 (H) 11/01/2022   BUN 20 11/01/2022   CREATININE 0.83 11/01/2022   BILITOT 0.4 11/01/2022   ALKPHOS 85 11/01/2022   AST 23 11/01/2022   ALT 16 11/01/2022   PROT 7.2 11/01/2022   ALBUMIN 4.4 11/01/2022   CALCIUM 9.8 11/01/2022   ANIONGAP 7 09/23/2021   GFR 67.94 11/01/2022   Lab Results  Component Value Date   CHOL 145 11/01/2022   Lab Results  Component Value Date   HDL 65.30 11/01/2022   Lab Results  Component Value Date   LDLCALC 61 11/01/2022   Lab Results  Component Value Date   TRIG 93.0 11/01/2022   Lab Results  Component Value Date   CHOLHDL 2 11/01/2022   Lab Results  Component Value Date   HGBA1C 7.0 (H) 11/01/2022      Assessment & Plan:  Constipation: Encouraged patient to take Milk of Magnesia with prune juice and suppositories.  Hypertension: This is well-controlled with Valsartan 80 mg and Hydrochlorothiazide 25 mg daily.  Immunizations: Encouraged patient to consider Shingles and Tetanus vaccinations.  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 Increase exercise as tolerated. Continue current meds       Relevant Orders   Hemoglobin A1c   Uric acid   Essential hypertension, benign    Well controlled, no changes to meds. Encouraged heart healthy diet such as the DASH diet and exercise as tolerated. Taking HCTZ QOD      Relevant Orders   CBC with Differential/Platelet   Comprehensive metabolic panel   TSH   Gout, unspecified    Hydrate and monitor taking Allopurinol qod      Hyperlipidemia    Encourage heart healthy diet such as MIND or DASH diet, increase exercise, avoid trans fats, simple carbohydrates and  processed foods, consider a krill or fish or flaxseed oil cap daily. Tolerating Rosuvastatin      Relevant Orders   Lipid panel   No orders of the defined types were placed in this encounter.  I, Penni Homans, MD, personally preformed the services described in this documentation.  All medical record entries made by the scribe were at my direction and in my presence.  I have reviewed the chart and discharge instructions (if applicable) and agree that the record reflects my personal performance and is accurate and complete. 11/06/2022  I,Mohammed  Iqbal,acting as a scribe for Penni Homans, MD.,have documented all relevant documentation on the behalf of Penni Homans, MD,as directed by  Penni Homans, MD while in the presence of Penni Homans, MD.  Penni Homans, MD

## 2022-11-22 ENCOUNTER — Ambulatory Visit: Payer: Medicare Other | Admitting: Family Medicine

## 2022-12-10 ENCOUNTER — Other Ambulatory Visit: Payer: Self-pay | Admitting: Family Medicine

## 2023-01-13 ENCOUNTER — Other Ambulatory Visit: Payer: Self-pay | Admitting: Family Medicine

## 2023-01-17 LAB — HM MAMMOGRAPHY

## 2023-01-21 ENCOUNTER — Encounter: Payer: Self-pay | Admitting: Family Medicine

## 2023-01-23 ENCOUNTER — Other Ambulatory Visit (HOSPITAL_BASED_OUTPATIENT_CLINIC_OR_DEPARTMENT_OTHER): Payer: Self-pay

## 2023-01-23 MED ORDER — SHINGRIX 50 MCG/0.5ML IM SUSR
Freq: Once | INTRAMUSCULAR | 0 refills | Status: AC
  Filled 2023-01-23: qty 1, 1d supply, fill #0

## 2023-02-23 ENCOUNTER — Other Ambulatory Visit: Payer: Self-pay | Admitting: Family Medicine

## 2023-02-23 DIAGNOSIS — I10 Essential (primary) hypertension: Secondary | ICD-10-CM

## 2023-05-16 ENCOUNTER — Other Ambulatory Visit (INDEPENDENT_AMBULATORY_CARE_PROVIDER_SITE_OTHER): Payer: Medicare Other

## 2023-05-16 DIAGNOSIS — I10 Essential (primary) hypertension: Secondary | ICD-10-CM | POA: Diagnosis not present

## 2023-05-16 DIAGNOSIS — E1149 Type 2 diabetes mellitus with other diabetic neurological complication: Secondary | ICD-10-CM | POA: Diagnosis not present

## 2023-05-16 DIAGNOSIS — E785 Hyperlipidemia, unspecified: Secondary | ICD-10-CM | POA: Diagnosis not present

## 2023-05-16 LAB — CBC WITH DIFFERENTIAL/PLATELET
Basophils Absolute: 0 10*3/uL (ref 0.0–0.1)
Basophils Relative: 0.4 % (ref 0.0–3.0)
Eosinophils Absolute: 0.1 10*3/uL (ref 0.0–0.7)
Eosinophils Relative: 1.9 % (ref 0.0–5.0)
HCT: 45.6 % (ref 36.0–46.0)
Hemoglobin: 14.9 g/dL (ref 12.0–15.0)
Lymphocytes Relative: 41.9 % (ref 12.0–46.0)
Lymphs Abs: 2.2 10*3/uL (ref 0.7–4.0)
MCHC: 32.7 g/dL (ref 30.0–36.0)
MCV: 88.8 fL (ref 78.0–100.0)
Monocytes Absolute: 0.5 10*3/uL (ref 0.1–1.0)
Monocytes Relative: 10 % (ref 3.0–12.0)
Neutro Abs: 2.4 10*3/uL (ref 1.4–7.7)
Neutrophils Relative %: 45.8 % (ref 43.0–77.0)
Platelets: 212 10*3/uL (ref 150.0–400.0)
RBC: 5.13 Mil/uL — ABNORMAL HIGH (ref 3.87–5.11)
RDW: 13.9 % (ref 11.5–15.5)
WBC: 5.2 10*3/uL (ref 4.0–10.5)

## 2023-05-16 LAB — COMPREHENSIVE METABOLIC PANEL
ALT: 18 U/L (ref 0–35)
AST: 24 U/L (ref 0–37)
Albumin: 4.1 g/dL (ref 3.5–5.2)
Alkaline Phosphatase: 78 U/L (ref 39–117)
BUN: 21 mg/dL (ref 6–23)
CO2: 30 meq/L (ref 19–32)
Calcium: 9.6 mg/dL (ref 8.4–10.5)
Chloride: 94 meq/L — ABNORMAL LOW (ref 96–112)
Creatinine, Ser: 0.78 mg/dL (ref 0.40–1.20)
GFR: 72.93 mL/min (ref >60.00)
Glucose, Bld: 107 mg/dL — ABNORMAL HIGH (ref 70–99)
Potassium: 4.5 meq/L (ref 3.5–5.1)
Sodium: 133 meq/L — ABNORMAL LOW (ref 135–145)
Total Bilirubin: 0.5 mg/dL (ref 0.2–1.2)
Total Protein: 6.4 g/dL (ref 6.0–8.3)

## 2023-05-16 LAB — LIPID PANEL
Cholesterol: 112 mg/dL (ref 0–200)
HDL: 39.7 mg/dL (ref >39.00)
LDL Cholesterol: 51 mg/dL (ref 0–99)
NonHDL: 72.38
Total CHOL/HDL Ratio: 3
Triglycerides: 107 mg/dL (ref 0.0–149.0)
VLDL: 21.4 mg/dL (ref 0.0–40.0)

## 2023-05-16 LAB — HEMOGLOBIN A1C: Hgb A1c MFr Bld: 7.1 % — ABNORMAL HIGH (ref 4.6–6.5)

## 2023-05-16 LAB — TSH: TSH: 2.12 u[IU]/mL (ref 0.35–5.50)

## 2023-05-16 LAB — URIC ACID: Uric Acid, Serum: 6.2 mg/dL (ref 2.4–7.0)

## 2023-05-22 NOTE — Assessment & Plan Note (Signed)
Well controlled, no changes to meds. Encouraged heart healthy diet such as the DASH diet and exercise as tolerated. Taking HCTZ QOD

## 2023-05-22 NOTE — Assessment & Plan Note (Signed)
Hydrate and monitor taking Allopurinol qod

## 2023-05-22 NOTE — Assessment & Plan Note (Signed)
hgba1c acceptable, minimize simple carbs. Increase exercise as tolerated. Continue current meds 

## 2023-05-22 NOTE — Assessment & Plan Note (Signed)
Encourage heart healthy diet such as MIND or DASH diet, increase exercise, avoid trans fats, simple carbohydrates and processed foods, consider a krill or fish or flaxseed oil cap daily. Tolerating Rosuvastatin

## 2023-05-23 ENCOUNTER — Encounter: Payer: Self-pay | Admitting: Family Medicine

## 2023-05-23 ENCOUNTER — Ambulatory Visit: Payer: Medicare Other | Admitting: Family Medicine

## 2023-05-23 VITALS — BP 122/60 | HR 54 | Temp 97.5°F | Resp 16 | Ht 62.0 in | Wt 139.4 lb

## 2023-05-23 DIAGNOSIS — E785 Hyperlipidemia, unspecified: Secondary | ICD-10-CM | POA: Diagnosis not present

## 2023-05-23 DIAGNOSIS — M109 Gout, unspecified: Secondary | ICD-10-CM | POA: Diagnosis not present

## 2023-05-23 DIAGNOSIS — Z23 Encounter for immunization: Secondary | ICD-10-CM

## 2023-05-23 DIAGNOSIS — I10 Essential (primary) hypertension: Secondary | ICD-10-CM

## 2023-05-23 DIAGNOSIS — E1149 Type 2 diabetes mellitus with other diabetic neurological complication: Secondary | ICD-10-CM | POA: Diagnosis not present

## 2023-05-23 DIAGNOSIS — Z7984 Long term (current) use of oral hypoglycemic drugs: Secondary | ICD-10-CM

## 2023-05-23 MED ORDER — VALSARTAN 80 MG PO TABS: 80.0000 mg | ORAL_TABLET | Freq: Every day | ORAL | 2 refills | Status: DC

## 2023-05-23 NOTE — Patient Instructions (Signed)

## 2023-05-24 NOTE — Progress Notes (Signed)
Subjective:    Patient ID: Andrea Calderon, female    DOB: [birth year], 77 y.o.   MRN: [id number]  Chief Complaint  Patient presents with  . Follow-up    Follow up    HPI Discussed the use of AI scribe software for clinical note transcription with the patient, who gave verbal consent to proceed.  History of Present Illness   The patient, with a history of diabetes, presents with concerns about her A1c levels, which have remained stable despite weight loss and increased exercise. The patient reports taking metformin after dinner and has noticed morning blood sugar levels ranging from 86 to 110. Despite exercising for several hours at the gym, the patient's blood sugar levels remain in the 90s. The patient also mentions alternating between allopurinol and hydrochlorothiazide for gout management, which has been effective. The patient has recently recovered from COVID-19 and is considering a booster shot. The patient also mentions a recent weight loss achievement, having lost nearly 100 pounds through diet and exercise.        Past Medical History:  Diagnosis Date  . BCC (basal cell carcinoma), face 12/05/2014   Removed from right side of nose [facility] Stefanie Libel and Dr Noe Gens  . Cancer (HCC)    BCC right nose  . Diabetes mellitus   . H/O: stroke 09/11/2011   September 2012 acute right retinal artery occlusion H/o left retinal artery embolic event by imaging H/o blood clot in port during chemotherapy   . Multiple thyroid nodules 06/13/2012  . 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 Other and unspecified hyperlipidemia 12/16/2012  . Preventative health care 06/21/2013  . Rosacea   . Valvular heart disease 06/13/2012   Cardiologist Dr Heron Nay of [facility] in [facility]    Past Surgical History:  Procedure Laterality Date  . ABDOMINAL HYSTERECTOMY  08/07/2007   total  . COLECTOMY  08/07/2007  . MOHS SURGERY Right    for [facility] on right nose, Feb 2016  .  PACEMAKER PLACEMENT Left    Valve replacement 9/22  . TONGUE BIOPSY  01/05/2015   negative results  . VENTRAL HERNIA REPAIR  06/06/2010   4 lesions found, mesh left in place    Family History  Problem Relation Age of Onset  . Transient ischemic attack Mother   . Hypertension Mother   . Dementia Mother   . Diabetes Father        borderline sugar  . Coronary artery disease Father   . Heart disease Maternal Grandfather   . Heart disease Paternal Grandfather     Social History   Socioeconomic History  . Marital status: Married    Spouse name: Not on file  . Number of children: Not on file  . Years of education: Not on file  . Highest education level: Some college, no degree  Occupational History  . Not on file  Tobacco Use  . Smoking status: Never  . Smokeless tobacco: Never  Vaping Use  . Vaping status: Never Used  Substance and Sexual Activity  . Alcohol use: Not Currently    Comment: rare, social  . Drug use: No  . Sexual activity: Yes  Other Topics Concern  . Not on file  Social History Narrative  . Not on file   Social Determinants of Health   Financial Resource Strain: Low Risk  (05/21/2023)   Overall Financial Resource Strain (CARDIA)   . Difficulty of Paying Living Expenses: Not  hard at all  Food Insecurity: No Food Insecurity (05/21/2023)   Hunger Vital Sign   . Worried About Programme researcher, broadcasting/film/video in the Last Year: Never true   . Ran Out of Food in the Last Year: Never true  Transportation Needs: No Transportation Needs (05/21/2023)   PRAPARE - Transportation   . Lack of Transportation (Medical): No   . Lack of Transportation (Non-Medical): No  Physical Activity: Sufficiently Active (05/21/2023)   Exercise Vital Sign   . Days of Exercise per Week: 5 days   . Minutes of Exercise per Session: 60 min  Stress: No Stress Concern Present (05/21/2023)   [employer] of Occupational Health - Occupational Stress Questionnaire   . Feeling of Stress : Only  a little  Social Connections: Moderately Integrated (05/21/2023)   Social Connection and Isolation Panel [NHANES]   . Frequency of Communication with Friends and Family: Once a week   . Frequency of Social Gatherings with Friends and Family: Twice a week   . Attends Religious Services: Never   . Active Member of Clubs or Organizations: Yes   . Attends Banker Meetings: More than 4 times per year   . Marital Status: Married  Catering manager Violence: Unknown (04/18/2023)   Received from [facility]   HITS   . Physically Hurt: Not on file   . Insult or Talk Down To: Not on file   . Threaten Physical Harm: Not on file   . Scream or Curse: Not on file    Outpatient Medications Prior to Visit  Medication Sig Dispense Refill  . allopurinol (ZYLOPRIM) 100 MG tablet TAKE 1 TABLET BY MOUTH EVERY DAY 90 tablet 1  . B Complex Vitamins (VITAMIN-B COMPLEX PO) Take 1 tablet by mouth daily. Reported on 12/22/2015    . blood glucose meter kit and supplies Dispense based on patient and insurance preference. Use up to rwo times daily as directed. DX: E11.9). Aviva Plus meter 1 each 0  . flecainide (TAMBOCOR) 50 MG tablet Take 50 mg by mouth 2 (two) times daily.    Marland Kitchen glucose blood (ACCU-CHEK AVIVA PLUS) test strip Check blood sugars twice daily 100 each 12  . hydrochlorothiazide (HYDRODIURIL) 25 MG tablet TAKE 1 TABLET BY MOUTH EVERY DAY 90 tablet 1  . hydrocortisone 2.5 % cream Apply topically 2 (two) times daily as needed. 28 g 3  . latanoprost (XALATAN) 0.005 % ophthalmic solution Place 1 drop into both eyes daily.  3  . metFORMIN (GLUCOPHAGE) 500 MG tablet TAKE 1 TABLET BY MOUTH 2 TIMES DAILY WITH A MEAL. 180 tablet 1  . OVER THE COUNTER MEDICATION Vita Fusion Fiber- 2 daily    . rosuvastatin (CRESTOR) 10 MG tablet TAKE 1 TABLET BY MOUTH EVERY DAY 90 tablet 1  . valsartan (DIOVAN) 160 MG tablet TAKE 1/2 TABLET BY MOUTH DAILY 30 tablet 3   No facility-administered medications prior to  visit.    Allergies  Allergen Reactions  . Codeine Nausea And Vomiting    Review of Systems  Constitutional:  Negative for fever and malaise/fatigue.  HENT:  Negative for congestion.   Eyes:  Negative for blurred vision.  Respiratory:  Negative for shortness of breath.   Cardiovascular:  Negative for chest pain, palpitations and leg swelling.  Gastrointestinal:  Negative for abdominal pain, blood in stool and nausea.  Genitourinary:  Negative for dysuria and frequency.  Musculoskeletal:  Negative for falls.  Skin:  Negative for rash.  Neurological:  Negative  for dizziness, loss of consciousness and headaches.  Endo/Heme/Allergies:  Negative for environmental allergies.  Psychiatric/Behavioral:  Negative for depression. The patient is not nervous/anxious.       Objective:    Physical Exam Constitutional:      General: She is not in acute distress.    Appearance: Normal appearance. She is well-developed. She is not toxic-appearing.  HENT:     Head: Normocephalic and atraumatic.     Right Ear: External ear normal.     Left Ear: External ear normal.     Nose: Nose normal.  Eyes:     General:        Right eye: No discharge.        Left eye: No discharge.     Conjunctiva/sclera: Conjunctivae normal.  Neck:     Thyroid: No thyromegaly.  Cardiovascular:     Rate and Rhythm: Normal rate and regular rhythm.     Heart sounds: Normal heart sounds. No murmur heard. Pulmonary:     Effort: Pulmonary effort is normal. No respiratory distress.     Breath sounds: Normal breath sounds.  Abdominal:     General: Bowel sounds are normal.     Palpations: Abdomen is soft.     Tenderness: There is no abdominal tenderness. There is no guarding.  Musculoskeletal:        General: Normal range of motion.     Cervical back: Neck supple.  Lymphadenopathy:     Cervical: No cervical adenopathy.  Skin:    General: Skin is warm and dry.  Neurological:     Mental Status: She is alert and  oriented to person, place, and time.  Psychiatric:        Mood and Affect: Mood normal.        Behavior: Behavior normal.        Thought Content: Thought content normal.        Judgment: Judgment normal.   BP 122/60 (BP Location: Left Arm, Patient Position: Sitting, Cuff Size: Normal)   Pulse (!) 54   Temp (!) 97.5 F (36.4 C) (Oral)   Resp 16   Ht 5\' 2"  (1.575 m)   Wt 139 lb 6.4 oz (63.2 kg)   SpO2 96%   BMI 25.50 kg/m  Wt Readings from Last 3 Encounters:  05/23/23 139 lb 6.4 oz (63.2 kg)  11/06/22 146 lb 6.4 oz (66.4 kg)  05/22/22 151 lb 12.8 oz (68.9 kg)    Diabetic Foot Exam - Simple   No data filed    Lab Results  Component Value Date   WBC 5.2 05/16/2023   HGB 14.9 05/16/2023   HCT 45.6 05/16/2023   PLT 212.0 05/16/2023   GLUCOSE 107 (H) 05/16/2023   CHOL 112 05/16/2023   TRIG 107.0 05/16/2023   HDL 39.70 05/16/2023   LDLDIRECT 182.4 09/11/2011   LDLCALC 51 05/16/2023   ALT 18 05/16/2023   AST 24 05/16/2023   NA 133 (L) 05/16/2023   K 4.5 05/16/2023   CL 94 (L) 05/16/2023   CREATININE 0.78 05/16/2023   BUN 21 05/16/2023   CO2 30 05/16/2023   TSH 2.12 05/16/2023   INR 0.9 09/11/2011   HGBA1C 7.1 (H) 05/16/2023   MICROALBUR 1.4 05/22/2022    Lab Results  Component Value Date   TSH 2.12 05/16/2023   Lab Results  Component Value Date   WBC 5.2 05/16/2023   HGB 14.9 05/16/2023   HCT 45.6 05/16/2023   MCV 88.8 05/16/2023  PLT 212.0 05/16/2023   Lab Results  Component Value Date   NA 133 (L) 05/16/2023   K 4.5 05/16/2023   CO2 30 05/16/2023   GLUCOSE 107 (H) 05/16/2023   BUN 21 05/16/2023   CREATININE 0.78 05/16/2023   BILITOT 0.5 05/16/2023   ALKPHOS 78 05/16/2023   AST 24 05/16/2023   ALT 18 05/16/2023   PROT 6.4 05/16/2023   ALBUMIN 4.1 05/16/2023   CALCIUM 9.6 05/16/2023   ANIONGAP 7 09/23/2021   GFR 72.93 05/16/2023   Lab Results  Component Value Date   CHOL 112 05/16/2023   Lab Results  Component Value Date   HDL 39.70  05/16/2023   Lab Results  Component Value Date   LDLCALC 51 05/16/2023   Lab Results  Component Value Date   TRIG 107.0 05/16/2023   Lab Results  Component Value Date   CHOLHDL 3 05/16/2023   Lab Results  Component Value Date   HGBA1C 7.1 (H) 05/16/2023       Assessment & Plan:  Gout, unspecified cause, unspecified chronicity, unspecified site Assessment & Plan: Hydrate and monitor taking Allopurinol qod  Orders: -     Uric acid; Future  Essential hypertension, benign Assessment & Plan: Well controlled, no changes to meds. Encouraged heart healthy diet such as the DASH diet and exercise as tolerated. Taking HCTZ QOD  Orders: -     CBC with Differential/Platelet; Future -     Comprehensive metabolic panel; Future -     TSH; Future  DM (diabetes mellitus), type 2 with neurological complications [facility]) Assessment & Plan: hgba1c acceptable, minimize simple carbs. Increase exercise as tolerated. Continue current meds   Orders: -     Hemoglobin A1c; Future -     Microalbumin / creatinine urine ratio; Future  Hyperlipidemia, unspecified hyperlipidemia type Assessment & Plan: Encourage heart healthy diet such as MIND or DASH diet, increase exercise, avoid trans fats, simple carbohydrates and processed foods, consider a krill or fish or flaxseed oil cap daily. Tolerating Rosuvastatin  Orders: -     Lipid panel; Future  Need for influenza vaccination -     Flu Vaccine Trivalent High Dose (Fluad)  Other orders -     Valsartan; Take 1 tablet (80 mg total) by mouth daily.  Dispense: 90 tablet; Refill: 2    Assessment and Plan    Type 2 Diabetes Mellitus Stable A1c at 7.1 despite weight loss and increased exercise. Discussed the impact of aging on pancreatic function and the balance between controlling high blood sugars and avoiding hypoglycemia. Considered the use of a continuous glucose monitor for better personal data and control. -Continue current management and  lifestyle modifications. -Consider obtaining a continuous glucose monitor if insurance allows.  Hypertension  -continue Valsartan at 80mg  daily. but change the prescription from 1/2 of a 160 mg tab to an 80 mg tab daily  Gout No recent flares. Patient is alternating Allopurinol with HCTZ. -Continue current management. -Check uric acid level at next lab visit.  General Health Maintenance -Administer influenza vaccine today. -Consider COVID-19 booster shot at patient's discretion, potentially switching to Pfizer for a more vigorous immune response. -Schedule follow-up visit and lab work in six months.         Danise Edge, MD

## 2023-06-13 ENCOUNTER — Other Ambulatory Visit: Payer: Self-pay | Admitting: Family Medicine

## 2023-07-16 ENCOUNTER — Encounter: Payer: Self-pay | Admitting: Family Medicine

## 2023-09-22 ENCOUNTER — Encounter (HOSPITAL_BASED_OUTPATIENT_CLINIC_OR_DEPARTMENT_OTHER): Payer: Self-pay | Admitting: Urology

## 2023-09-22 ENCOUNTER — Emergency Department (HOSPITAL_BASED_OUTPATIENT_CLINIC_OR_DEPARTMENT_OTHER)
Admission: EM | Admit: 2023-09-22 | Discharge: 2023-09-22 | Discharge status: Against Medical Advice | Payer: Medicare Other | Attending: Emergency Medicine | Admitting: Emergency Medicine

## 2023-09-22 ENCOUNTER — Emergency Department (HOSPITAL_BASED_OUTPATIENT_CLINIC_OR_DEPARTMENT_OTHER): Payer: Medicare Other

## 2023-09-22 DIAGNOSIS — Y9289 Other specified places as the place of occurrence of the external cause: Secondary | ICD-10-CM | POA: Diagnosis not present

## 2023-09-22 DIAGNOSIS — Z5321 Procedure and treatment not carried out due to patient leaving prior to being seen by health care provider: Secondary | ICD-10-CM | POA: Insufficient documentation

## 2023-09-22 DIAGNOSIS — Y9301 Activity, walking, marching and hiking: Secondary | ICD-10-CM | POA: Insufficient documentation

## 2023-09-22 DIAGNOSIS — W0110XA Fall on same level from slipping, tripping and stumbling with subsequent striking against unspecified object, initial encounter: Secondary | ICD-10-CM | POA: Insufficient documentation

## 2023-09-22 DIAGNOSIS — S0093XA Contusion of unspecified part of head, initial encounter: Secondary | ICD-10-CM | POA: Diagnosis present

## 2023-09-22 NOTE — ED Notes (Signed)
Per registration pt left. No longer wanted to wait

## 2023-09-22 NOTE — ED Triage Notes (Signed)
Per EMS pt fell today while at the track at the gym States recently started back weight watchers and felt woozy while walking, went to sit down and loss balance, fell forward hitting head Large bump with bruising noted to head No lOC at time of fall  NOT on blood thinners

## 2023-10-17 ENCOUNTER — Other Ambulatory Visit: Payer: Self-pay | Admitting: Family Medicine

## 2023-10-20 ENCOUNTER — Encounter: Payer: Self-pay | Admitting: Family Medicine

## 2023-10-23 ENCOUNTER — Other Ambulatory Visit: Payer: Self-pay | Admitting: Pharmacist

## 2023-10-23 ENCOUNTER — Encounter: Payer: Self-pay | Admitting: Pharmacist

## 2023-10-23 MED ORDER — FREESTYLE LIBRE 3 READER DEVI: 0 refills | Status: DC

## 2023-10-23 MED ORDER — FREESTYLE LIBRE 3 PLUS SENSOR MISC: 2 refills | Status: DC

## 2023-10-23 NOTE — Progress Notes (Signed)
 10/23/2023 Name: Andrea Calderon MRN: [id number] DOB: Jan 03, 1946  Chief Complaint  Patient presents with   Diabetes     Subjective:  Patient was referred by her PCP to see if she would qualify for personal Continuous Glucose Monitor.  Patient has Medicare part A and B with supplement with [employer]. She also has Part D plan with [employer].  Current diabetes medications include: metformin 500mg  twice a day Patient has lost a significant amount of weight with diet and exercise over the last few years.  Her last A1c was 7.1% She had documentation office notes of hypoglycemia in 2018 but no specific numbers provided.    Medication Access/Adherence  [facility]  [phone] [place] [place] [facility] [address] [place] [address] Phone: [phone] Fax: [phone]  [facility] [address], [address] [place] [address] Phone: [phone] Fax: [phone]  [facility] [id number] [place], [place] [address]  [place] [address] Phone: [phone] Fax: [phone]    Objective:  Lab Results  Component Value Date   HGBA1C 7.1 (H) 05/16/2023    Lab Results  Component Value Date   CREATININE 0.78 05/16/2023   BUN 21 05/16/2023   NA 133 (L) 05/16/2023   K 4.5 05/16/2023   CL 94 (L) 05/16/2023   CO2 30 05/16/2023    Lab Results  Component Value Date   CHOL 112 05/16/2023   HDL 39.70 05/16/2023   LDLCALC 51 05/16/2023   LDLDIRECT 182.4 09/11/2011   TRIG 107.0 05/16/2023   CHOLHDL 3 05/16/2023    Medications Reviewed Today     Reviewed by [name], RPH-CPP (Pharmacist) on 10/23/23 at 1659  Med List Status: <None>   Medication Order Taking? Sig Documenting Provider Last Dose Status Informant  allopurinol (ZYLOPRIM) 100 MG tablet [id number] No TAKE 1 TABLET BY MOUTH EVERY DAY Bradd Canary, MD Taking  Active   B Complex Vitamins (VITAMIN-B COMPLEX PO) [id number] No Take 1 tablet by mouth daily. Reported on 12/22/2015 [provider] Taking Active   blood glucose meter kit and supplies [id number] No Dispense based on patient and insurance preference. Use up to rwo times daily as directed. DX: E11.9). Aviva Plus meter Bradd Canary, MD Taking Active   Continuous Glucose Receiver (FREESTYLE LIBRE 3 READER) DEVI [id number]  Use with sensor to check blood glucose continuously. Bradd Canary, MD  Active   Continuous Glucose Sensor (FREESTYLE LIBRE 3 PLUS SENSOR) [place] [id number]  Use to check blood glucose continuous. Change sensor every 15 days. Bradd Canary, MD  Active   flecainide (TAMBOCOR) 50 MG tablet [id number] No Take 50 mg by mouth 2 (two) times daily. [provider] Taking Active   glucose blood (ACCU-CHEK AVIVA PLUS) test strip [id number] No Check blood sugars twice daily Bradd Canary, MD Taking Active   hydrochlorothiazide (HYDRODIURIL) 25 MG tablet [id number]  TAKE 1 TABLET BY MOUTH EVERY DAY Bradd Canary, MD  Active   hydrocortisone 2.5 % cream [id number] No Apply topically 2 (two) times daily as needed. Bradd Canary, MD Taking Active   latanoprost (XALATAN) 0.005 % ophthalmic solution [id number] No Place 1 drop into both eyes daily. [provider] Taking Active            Med Note Lilian Kapur Dec 22, 2015  1:17 PM)  Received from: External Pharmacy Received Sig: INSTILL 1 DROP INTO BOTH EYES QHS  metFORMIN (GLUCOPHAGE) 500 MG tablet [id number]  TAKE 1 TABLET BY MOUTH TWICE A DAY WITH FOOD Bradd Canary, MD  Active   OVER THE COUNTER MEDICATION [id number] No Vita Fusion Fiber- 2 daily [provider] Taking Active   rosuvastatin (CRESTOR) 10 MG tablet [id number]  TAKE 1 TABLET BY MOUTH EVERY DAY Bradd Canary, MD  Active   valsartan (DIOVAN) 80 MG tablet [id number]  Take 1 tablet (80 mg total) by mouth daily. Bradd Canary, MD  Active                Assessment/Plan:   Diabetes: -Patient's preference was to get Continuous Glucose Monitor from [employer] [facility]  which is a medical supply company. I tied to submit Continuous Glucose Monitor order with parachute but it was denied - patient did not meet Medicare criteria which requires insulin use or documentation of recurrent hypoglycemia level 2 (glucose < 54 mg/dL) despite multiple treatment adjustments, or history of one level 3 hypoglycemic event (glucose < 54 mg/dL) characterized by altered mental or physical state requiring assistance - I did send Rx to her pharmacy - her [employer] Part D plan might cover Continuous Glucose Monitor without need for insulin or hypoglycemia. If not covered I can try to call [facility] directly to see if there is any other possible way to get Continuous Glucose Monitor.  - If insurance does not cover she can use coupon to get sensors at $75 per month but reader cost might be $90+.    [name], PharmD Clinical Pharmacist [facility]

## 2023-10-23 NOTE — Telephone Encounter (Signed)
 Patient might be able to get Continuous Glucose Monitor sensors and reader thru her Part D plan. Sending Rx to her pharmacy.

## 2023-10-23 NOTE — Telephone Encounter (Signed)
 Spoke with Tammy this afternoon and she will be able to assist as soon as she can. She's at our office on Monday and Wednesday. She didn't receive the request until late Monday afternoon

## 2023-10-24 ENCOUNTER — Telehealth: Payer: Self-pay | Admitting: Pharmacist

## 2023-10-24 NOTE — Progress Notes (Signed)
   10/24/2023 Name: Andrea Calderon MRN: [id number] DOB: 24-Mar-1946  Chief Complaint  Patient presents with   Medication Management    CGM   Subjective:  Patient was referred by her PCP to see if she would qualify for personal Continuous Glucose Monitor.  Patient has Medicare part A and B with supplement with [employer]. She also has Part D plan with [employer] [facility] [employer]. Started order for [facility] 3+ sensors and reader yesterday thru parachute but order was declined because patient did not meet Medicare guidelines for Continuous Glucose Monitor use. Medicare A and B requires that patient is either using insulin therapy or has documentation of recurrent hypoglycemia level 2 (glucose < 54 mg/dL) despite multiple treatment adjustments, or history of one level 3 hypoglycemic event (glucose < 54 mg/dL) characterized by altered mental or physical state requiring assistance.   Current diabetes medications include: metformin 500mg  twice a day Patient has lost a significant amount of weight with diet and exercise over the last few years.  Her last A1c was 7.1% She had documentation office notes of hypoglycemia in 2018 but no specific numbers provided.    Medication Access/Adherence  [facility]:  [phone] [place], [place] - [address] [place] [address] Phone: [phone] Fax: [phone]  [facility] [address] [place] [address] Phone: [phone] Fax: [phone]  [facility] [id number] [place], [place] [address] [place] [address] Phone: [phone] Fax: [phone]    Objective:  Lab Results  Component Value Date   HGBA1C 7.1 (H) 05/16/2023     Assessment/Plan:   Diabetes: - [facility] tried to fill Continuous Glucose Monitor on her Part D plan. Rejected - not covered.  - Called [facility] to see  if they would be able to fill Continuous Glucose Monitor. Per representative with [facility], they bill Medicare Part B first, then supplemental insurance. Representative states, if Medicare denies, then supplemental insurance will as well so I was unable to order Continuous Glucose Monitor thru [facility].  - Called [employer] to see if they possibly cover Dex Com line of Continuous Glucose Monitor instead of Libre but pre representative Dex Com also is not covered by her [employer] Part D plan.  - There is a coupon patient can request her pharmacy to use for the [facility] 3 plus sensors that would lower cost of #2 sensors = 30 day supply to around $75 / month. She should have to purchase the reader as well is she still prefers not to use her phone. [name], PharmD Clinical Pharmacist [facility]

## 2023-10-25 ENCOUNTER — Other Ambulatory Visit (HOSPITAL_BASED_OUTPATIENT_CLINIC_OR_DEPARTMENT_OTHER): Payer: Self-pay

## 2023-10-25 ENCOUNTER — Other Ambulatory Visit: Payer: Self-pay

## 2023-10-25 ENCOUNTER — Emergency Department (HOSPITAL_BASED_OUTPATIENT_CLINIC_OR_DEPARTMENT_OTHER)

## 2023-10-25 ENCOUNTER — Emergency Department (HOSPITAL_BASED_OUTPATIENT_CLINIC_OR_DEPARTMENT_OTHER)
Admission: EM | Admit: 2023-10-25 | Discharge: 2023-10-25 | Disposition: A | Discharge status: Home / Self Care | Attending: Emergency Medicine | Admitting: Emergency Medicine

## 2023-10-25 DIAGNOSIS — Z79899 Other long term (current) drug therapy: Secondary | ICD-10-CM | POA: Insufficient documentation

## 2023-10-25 DIAGNOSIS — Z85828 Personal history of other malignant neoplasm of skin: Secondary | ICD-10-CM | POA: Insufficient documentation

## 2023-10-25 DIAGNOSIS — R350 Frequency of micturition: Secondary | ICD-10-CM | POA: Insufficient documentation

## 2023-10-25 DIAGNOSIS — R1030 Lower abdominal pain, unspecified: Secondary | ICD-10-CM | POA: Insufficient documentation

## 2023-10-25 DIAGNOSIS — I10 Essential (primary) hypertension: Secondary | ICD-10-CM | POA: Diagnosis not present

## 2023-10-25 DIAGNOSIS — R319 Hematuria, unspecified: Secondary | ICD-10-CM | POA: Diagnosis present

## 2023-10-25 DIAGNOSIS — N390 Urinary tract infection, site not specified: Secondary | ICD-10-CM

## 2023-10-25 DIAGNOSIS — E119 Type 2 diabetes mellitus without complications: Secondary | ICD-10-CM | POA: Diagnosis not present

## 2023-10-25 DIAGNOSIS — Z7984 Long term (current) use of oral hypoglycemic drugs: Secondary | ICD-10-CM | POA: Diagnosis not present

## 2023-10-25 LAB — CBC
HCT: 41.2 % (ref 36.0–46.0)
Hemoglobin: 13.9 g/dL (ref 12.0–15.0)
MCH: 29.8 pg (ref 26.0–34.0)
MCHC: 33.7 g/dL (ref 30.0–36.0)
MCV: 88.2 fL (ref 80.0–100.0)
Platelets: 157 10*3/uL (ref 150–400)
RBC: 4.67 MIL/uL (ref 3.87–5.11)
RDW: 13.9 % (ref 11.5–15.5)
WBC: 11.7 10*3/uL — ABNORMAL HIGH (ref 4.0–10.5)
nRBC: 0 % (ref 0.0–0.2)

## 2023-10-25 LAB — URINALYSIS, MICROSCOPIC (REFLEX)
RBC / HPF: >50 RBC/hpf (ref 0–5)
WBC, UA: >50 WBC/hpf (ref 0–5)

## 2023-10-25 LAB — BASIC METABOLIC PANEL
Anion gap: 10 (ref 5–15)
BUN: 18 mg/dL (ref 8–23)
CO2: 27 mmol/L (ref 22–32)
Calcium: 9.2 mg/dL (ref 8.9–10.3)
Chloride: 95 mmol/L — ABNORMAL LOW (ref 98–111)
Creatinine, Ser: 0.88 mg/dL (ref 0.44–1.00)
GFR, Estimated: >60 mL/min (ref >60)
Glucose, Bld: 155 mg/dL — ABNORMAL HIGH (ref 70–99)
Potassium: 3.8 mmol/L (ref 3.5–5.1)
Sodium: 132 mmol/L — ABNORMAL LOW (ref 135–145)

## 2023-10-25 LAB — URINALYSIS, ROUTINE W REFLEX MICROSCOPIC

## 2023-10-25 MED ORDER — CEPHALEXIN 500 MG PO CAPS
500.0000 mg | ORAL_CAPSULE | Freq: Three times a day (TID) | ORAL | 0 refills | Status: DC
  Filled 2023-10-25: qty 15, 5d supply, fill #0

## 2023-10-25 MED ORDER — CEPHALEXIN 250 MG PO CAPS
500.0000 mg | ORAL_CAPSULE | Freq: Once | ORAL | Status: AC
  Administered 2023-10-25: 500 mg via ORAL
  Filled 2023-10-25: qty 2

## 2023-10-25 NOTE — ED Provider Notes (Signed)
  EMERGENCY DEPARTMENT AT [facility] Provider Note   CSN: [id number] Arrival date & time: 10/25/23  1320     History  Chief Complaint  Patient presents with   Hematuria    Andrea Calderon is a 78 y.o. female.   Hematuria   78 year old female presents emergency department with complaints of urinary frequency as well as hematuria.  States that earlier today, developed some feelings of urinary frequency but feels like she has not been voiding very much.  States that later on today, developed gross hematuria.  Reports some pain in her lower middle abdomen without radiation.  Denies any fevers, chills, nausea, vomiting, fever, chills.  Denies blood thinner use.  Denies history of similar symptoms in the past.  Past medical history significant diabetes mellitus, CVA, cancer, basal cell carcinoma, hypertension, hyperlipidemia  Home Medications Prior to Admission medications   Medication Sig Start Date End Date Taking? Authorizing Provider  allopurinol (ZYLOPRIM) 100 MG tablet TAKE 1 TABLET BY MOUTH EVERY DAY 01/14/23   Bradd Canary, MD  B Complex Vitamins (VITAMIN-B COMPLEX PO) Take 1 tablet by mouth daily. Reported on 12/22/2015    [provider]  blood glucose meter kit and supplies Dispense based on patient and insurance preference. Use up to rwo times daily as directed. DX: E11.9). Aviva Plus meter 12/24/17   Bradd Canary, MD  Continuous Glucose Receiver (FREESTYLE LIBRE 3 READER) DEVI Use with sensor to check blood glucose continuously. 10/23/23   Bradd Canary, MD  Continuous Glucose Sensor (FREESTYLE LIBRE 3 PLUS SENSOR) MISC Use to check blood glucose continuous. Change sensor every 15 days. 10/23/23   Bradd Canary, MD  flecainide (TAMBOCOR) 50 MG tablet Take 50 mg by mouth 2 (two) times daily.    [provider]  glucose blood (ACCU-CHEK AVIVA PLUS) test strip Check blood sugars twice daily 12/24/17   Bradd Canary, MD   hydrochlorothiazide (HYDRODIURIL) 25 MG tablet TAKE 1 TABLET BY MOUTH EVERY DAY 10/17/23   Bradd Canary, MD  hydrocortisone 2.5 % cream Apply topically 2 (two) times daily as needed. 12/01/20   Bradd Canary, MD  latanoprost (XALATAN) 0.005 % ophthalmic solution Place 1 drop into both eyes daily. 11/20/15   [provider]  metFORMIN (GLUCOPHAGE) 500 MG tablet TAKE 1 TABLET BY MOUTH TWICE A DAY WITH FOOD 06/13/23   Bradd Canary, MD  OVER THE COUNTER MEDICATION Vita Fusion Fiber- 2 daily    [provider]  rosuvastatin (CRESTOR) 10 MG tablet TAKE 1 TABLET BY MOUTH EVERY DAY 06/13/23   Bradd Canary, MD  valsartan (DIOVAN) 80 MG tablet Take 1 tablet (80 mg total) by mouth daily. 05/23/23   Bradd Canary, MD      Allergies    Codeine    Review of Systems   Review of Systems  Genitourinary:  Positive for hematuria.  All other systems reviewed and are negative.   Physical Exam Updated Vital Signs BP (!) 145/52 (BP Location: Left Arm)   Pulse 75   Temp 97.8 F (36.6 C)   Resp 20   Ht 5\' 2"  (1.575 m)   Wt 62.6 kg   SpO2 95%   BMI 25.24 kg/m  Physical Exam Vitals and nursing note reviewed.  Constitutional:      General: She is not in acute distress.    Appearance: She is well-developed.  HENT:     Head: Normocephalic and atraumatic.  Eyes:  Conjunctiva/sclera: Conjunctivae normal.  Cardiovascular:     Rate and Rhythm: Normal rate and regular rhythm.     Heart sounds: No murmur heard. Pulmonary:     Effort: Pulmonary effort is normal. No respiratory distress.     Breath sounds: Normal breath sounds.  Abdominal:     Palpations: Abdomen is soft.     Tenderness: There is abdominal tenderness.     Comments: Suprapubic tenderness.  Musculoskeletal:        General: No swelling.     Cervical back: Neck supple.  Skin:    General: Skin is warm and dry.     Capillary Refill: Capillary refill takes less than 2 seconds.  Neurological:     Mental  Status: She is alert.  Psychiatric:        Mood and Affect: Mood normal.     ED Results / Procedures / Treatments   Labs (all labs ordered are listed, but only abnormal results are displayed) Labs Reviewed  URINALYSIS, ROUTINE W REFLEX MICROSCOPIC - Abnormal; Notable for the following components:      Result Value   Color, Urine RED (*)    APPearance TURBID (*)    Glucose, UA   (*)    Value: TEST NOT REPORTED DUE TO COLOR INTERFERENCE OF URINE PIGMENT   Hgb urine dipstick   (*)    Value: TEST NOT REPORTED DUE TO COLOR INTERFERENCE OF URINE PIGMENT   Bilirubin Urine   (*)    Value: TEST NOT REPORTED DUE TO COLOR INTERFERENCE OF URINE PIGMENT   Ketones, ur   (*)    Value: TEST NOT REPORTED DUE TO COLOR INTERFERENCE OF URINE PIGMENT   Protein, ur   (*)    Value: TEST NOT REPORTED DUE TO COLOR INTERFERENCE OF URINE PIGMENT   Nitrite   (*)    Value: TEST NOT REPORTED DUE TO COLOR INTERFERENCE OF URINE PIGMENT   Leukocytes,Ua   (*)    Value: TEST NOT REPORTED DUE TO COLOR INTERFERENCE OF URINE PIGMENT   All other components within normal limits  URINALYSIS, MICROSCOPIC (REFLEX) - Abnormal; Notable for the following components:   Bacteria, UA MANY (*)    All other components within normal limits  URINE CULTURE  BASIC METABOLIC PANEL  CBC    EKG None  Radiology No results found.  Procedures Procedures    Medications Ordered in ED Medications  cephALEXin (KEFLEX) capsule 500 mg (has no administration in time range)    ED Course/ Medical Decision Making/ A&P Clinical Course as of 10/25/23 1533  Fri Oct 25, 2023  1518 Stable HO CAR Gross hematuria. No hx of similar.  Renal study pending.  [CC]    Clinical Course User Index [CC] Glyn Ade, MD                                 Medical Decision Making Amount and/or Complexity of Data Reviewed Labs: ordered. Radiology: ordered.  Risk Prescription drug management.   This patient presents to the ED for  concern of hematuria urinary frequency, this involves an extensive number of treatment options, and is a complaint that carries with it a high risk of complications and morbidity.  The differential diagnosis includes UTI, pyelonephritis, bladder cancer, nephrolithiasis, nausea   Co morbidities that complicate the patient evaluation  See HPI   Additional history obtained:  Additional history obtained from EMR External records from outside source  obtained and reviewed including hospital records   Lab Tests:  I Ordered, and personally interpreted labs.  The pertinent results include: UA with many bacteria, greater than 50 RBCs and WBCs.  Other labs pending.   Imaging Studies ordered:  I ordered imaging studies including CT renal stone study which is pending  Cardiac Monitoring: / EKG:  The patient was maintained on a cardiac monitor.  I personally viewed and interpreted the cardiac monitored which showed an underlying rhythm of: Sinus rhythm   Consultations Obtained:  N/a   Problem List / ED Course / Critical interventions / Medication management  Hematuria I ordered medication including Keflex Reevaluation of the patient after these medicines showed that the patient improved I have reviewed the patients home medicines and have made adjustments as needed   Social Determinants of Health:  Denies tobacco, licit drug use.   Test / Admission - Considered:  Hematuria Vitals signs significant for hypertension. Otherwise within normal range and stable throughout visit. Laboratory/imaging studies significant for: see above 78 year old female presents emergency department with complaints of urinary frequency as well as hematuria.  States that earlier today, developed some feelings of urinary frequency but feels like she has not been voiding very much.  States that later on today, developed gross hematuria.  Reports some pain in her lower middle abdomen without radiation.   Denies any fevers, chills, nausea, vomiting, fever, chills.  Denies blood thinner use.  Denies history of similar symptoms in the past. On exam, slight suprapubic tenderness.  UA concerning for gross hematuria and unable to be evaluated.  Urine culture pending.  Laboratory studies well CT renal stone study pain at time of shift change.  Unsure of exact etiology of patient's gross hematuria but could be secondary to UTI, nephrolithiasis/pyelonephritis, malignancy, other.  At time of shift change, patient care handed off to Dr. Doran Durand. Patient stable upon shift change.        Final Clinical Impression(s) / ED Diagnoses Final diagnoses:  None    Rx / DC Orders ED Discharge Orders     None         Peter Garter, [place] 10/25/23 1533    Maia Plan, MD 10/26/23 1425

## 2023-10-25 NOTE — ED Triage Notes (Signed)
 Pt POV steady gait- c/o sudden onset urinary frequency,  now with hematuria starting today appx 1300.

## 2023-10-25 NOTE — ED Provider Notes (Signed)
 Care of patient received from prior provider at 3:19 PM, please see their note for complete H/P and care plan.  Received handoff per ED course.  Clinical Course as of 10/25/23 1519  Fri Oct 25, 2023  1518 Stable HO CAR Gross hematuria. No hx of similar.  Renal study pending.  [CC]    Clinical Course User Index [CC] Glyn Ade, MD    Reassessment Patient's history present illness with exam findings not consistent with any acute pathology. Patient refused CT scan to rule out pyelonephritis or nephrolithiasis. She has no flank pain on my exam at this time. She is already being in Keflex.  Will be continued on this for the next 5 days with strict return precautions.  Recommend follow-up with PCP to ensure clearance given the degree of hematuria.  Patient expressed understanding as well as importance of returning for completion of the workup should symptoms continue.   Disposition:  I have considered need for hospitalization, however, considering all of the above, I believe this patient is stable for discharge at this time.  Patient/family educated about specific return precautions for given chief complaint and symptoms.  Patient/family educated about follow-up with PC.     Patient/family expressed understanding of return precautions and need for follow-up. Patient spoken to regarding all imaging and laboratory results and appropriate follow up for these results. All education provided in verbal form with additional information in written form. Time was allowed for answering of patient questions. Patient discharged.    Emergency Department Medication Summary:   Medications  cephALEXin (KEFLEX) capsule 500 mg (500 mg Oral Given 10/25/23 1603)          Glyn Ade, MD 10/25/23 1644

## 2023-10-26 LAB — URINE CULTURE: Culture: NO GROWTH

## 2023-11-05 ENCOUNTER — Other Ambulatory Visit (INDEPENDENT_AMBULATORY_CARE_PROVIDER_SITE_OTHER): Payer: Medicare Other

## 2023-11-05 ENCOUNTER — Encounter: Payer: Self-pay | Admitting: Family Medicine

## 2023-11-05 DIAGNOSIS — E785 Hyperlipidemia, unspecified: Secondary | ICD-10-CM

## 2023-11-05 DIAGNOSIS — I10 Essential (primary) hypertension: Secondary | ICD-10-CM

## 2023-11-05 DIAGNOSIS — M109 Gout, unspecified: Secondary | ICD-10-CM

## 2023-11-05 DIAGNOSIS — E1149 Type 2 diabetes mellitus with other diabetic neurological complication: Secondary | ICD-10-CM

## 2023-11-05 LAB — URIC ACID: Uric Acid, Serum: 6.9 mg/dL (ref 2.4–7.0)

## 2023-11-05 LAB — HEMOGLOBIN A1C: Hgb A1c MFr Bld: 6.9 % — ABNORMAL HIGH (ref 4.6–6.5)

## 2023-11-05 LAB — CBC WITH DIFFERENTIAL/PLATELET
Basophils Absolute: 0 K/uL (ref 0.0–0.1)
Basophils Relative: 0.7 % (ref 0.0–3.0)
Eosinophils Absolute: 0.1 K/uL (ref 0.0–0.7)
Eosinophils Relative: 1.3 % (ref 0.0–5.0)
HCT: 43.9 % (ref 36.0–46.0)
Hemoglobin: 14.6 g/dL (ref 12.0–15.0)
Lymphocytes Relative: 35.8 % (ref 12.0–46.0)
Lymphs Abs: 2 K/uL (ref 0.7–4.0)
MCHC: 33.3 g/dL (ref 30.0–36.0)
MCV: 91.2 fl (ref 78.0–100.0)
Monocytes Absolute: 0.4 K/uL (ref 0.1–1.0)
Monocytes Relative: 7.6 % (ref 3.0–12.0)
Neutro Abs: 3.1 K/uL (ref 1.4–7.7)
Neutrophils Relative %: 54.6 % (ref 43.0–77.0)
Platelets: 185 K/uL (ref 150.0–400.0)
RBC: 4.82 Mil/uL (ref 3.87–5.11)
RDW: 14.1 % (ref 11.5–15.5)
WBC: 5.6 K/uL (ref 4.0–10.5)

## 2023-11-05 LAB — COMPREHENSIVE METABOLIC PANEL WITH GFR
ALT: 16 U/L (ref 0–35)
AST: 23 U/L (ref 0–37)
Albumin: 4.4 g/dL (ref 3.5–5.2)
Alkaline Phosphatase: 76 U/L (ref 39–117)
BUN: 25 mg/dL — ABNORMAL HIGH (ref 6–23)
CO2: 31 meq/L (ref 19–32)
Calcium: 9.6 mg/dL (ref 8.4–10.5)
Chloride: 96 meq/L (ref 96–112)
Creatinine, Ser: 0.82 mg/dL (ref 0.40–1.20)
GFR: 68.45 mL/min
Glucose, Bld: 125 mg/dL — ABNORMAL HIGH (ref 70–99)
Potassium: 4.3 meq/L (ref 3.5–5.1)
Sodium: 136 meq/L (ref 135–145)
Total Bilirubin: 0.5 mg/dL (ref 0.2–1.2)
Total Protein: 7.2 g/dL (ref 6.0–8.3)

## 2023-11-05 LAB — LIPID PANEL
Cholesterol: 143 mg/dL (ref 0–200)
HDL: 58.9 mg/dL (ref >39.00)
LDL Cholesterol: 65 mg/dL (ref 0–99)
NonHDL: 83.89
Total CHOL/HDL Ratio: 2
Triglycerides: 93 mg/dL (ref 0.0–149.0)
VLDL: 18.6 mg/dL (ref 0.0–40.0)

## 2023-11-05 LAB — MICROALBUMIN / CREATININE URINE RATIO
Creatinine,U: 88.7 mg/dL
Microalb Creat Ratio: 51.7 mg/g — ABNORMAL HIGH (ref 0.0–30.0)
Microalb, Ur: 4.6 mg/dL — ABNORMAL HIGH (ref 0.0–1.9)

## 2023-11-05 LAB — TSH: TSH: 3.91 u[IU]/mL (ref 0.35–5.50)

## 2023-11-10 NOTE — Assessment & Plan Note (Signed)
 Encourage heart healthy diet such as MIND or DASH diet, increase exercise, avoid trans fats, simple carbohydrates and processed foods, consider a krill or fish or flaxseed oil cap daily. Tolerating Rosuvastatin

## 2023-11-10 NOTE — Assessment & Plan Note (Signed)
Hydrate and monitor taking Allopurinol qod

## 2023-11-10 NOTE — Assessment & Plan Note (Signed)
Well controlled, no changes to meds. Encouraged heart healthy diet such as the DASH diet and exercise as tolerated. Taking HCTZ QOD

## 2023-11-11 ENCOUNTER — Encounter: Payer: Self-pay | Admitting: Family Medicine

## 2023-11-11 ENCOUNTER — Ambulatory Visit (INDEPENDENT_AMBULATORY_CARE_PROVIDER_SITE_OTHER): Payer: Medicare Other | Admitting: Family Medicine

## 2023-11-11 VITALS — BP 120/64 | HR 59 | Temp 97.8°F | Resp 16 | Ht 62.0 in | Wt 144.4 lb

## 2023-11-11 DIAGNOSIS — E1149 Type 2 diabetes mellitus with other diabetic neurological complication: Secondary | ICD-10-CM

## 2023-11-11 DIAGNOSIS — E785 Hyperlipidemia, unspecified: Secondary | ICD-10-CM

## 2023-11-11 DIAGNOSIS — R319 Hematuria, unspecified: Secondary | ICD-10-CM | POA: Diagnosis not present

## 2023-11-11 DIAGNOSIS — I10 Essential (primary) hypertension: Secondary | ICD-10-CM

## 2023-11-11 DIAGNOSIS — M109 Gout, unspecified: Secondary | ICD-10-CM | POA: Diagnosis not present

## 2023-11-11 DIAGNOSIS — N390 Urinary tract infection, site not specified: Secondary | ICD-10-CM | POA: Diagnosis not present

## 2023-11-11 LAB — URINALYSIS, ROUTINE W REFLEX MICROSCOPIC
Bilirubin Urine: NEGATIVE
Hgb urine dipstick: NEGATIVE
Ketones, ur: NEGATIVE
Leukocytes,Ua: NEGATIVE
Nitrite: NEGATIVE
Specific Gravity, Urine: <=1.005 — AB (ref 1.000–1.030)
Total Protein, Urine: NEGATIVE
Urine Glucose: NEGATIVE
Urobilinogen, UA: 0.2 (ref 0.0–1.0)
pH: 6 (ref 5.0–8.0)

## 2023-11-11 LAB — MICROALBUMIN / CREATININE URINE RATIO
Creatinine,U: 26.6 mg/dL
Microalb Creat Ratio: UNDETERMINED mg/g (ref 0.0–30.0)
Microalb, Ur: <0.7 mg/dL

## 2023-11-11 NOTE — Progress Notes (Signed)
 Subjective:    Patient ID: Andrea Calderon, female    DOB: 1946/01/03, 78 y.o.   MRN: [id number]  Chief Complaint  Patient presents with   Follow-up    HPI Discussed the use of AI scribe software for clinical note transcription with the patient, who gave verbal consent to proceed.  History of Present Illness [name] is a 78 year old female who presents for follow-up after a fall and recent UTI.  In February, she experienced a fall while attempting to sit on a bench, resulting in a forward fall on the track and a momentary loss of consciousness. Since the fall, she has had residual bruising under her eye and a persistent, mobile knot on top of her foot, which is not tender to touch but becomes sore with prolonged standing. She has not had the foot x-rayed but suspects a possible fracture or tendon injury. She wraps the foot when active and uses ice for relief.  She recently experienced her first urinary tract infection (UTI) about two weeks ago, characterized by painful urination and hematuria. Initially, she thought she could manage over the weekend but sought medical attention upon seeing blood. She has since recovered from the UTI.  She has a history of nasal drip, which was resolved with a nasal spray prescribed by an ENT. Despite a medication change from lisinopril to valsartan, she continues to experience a persistent cough. She manages the cough with Mucinex and over-the-counter allergy medication.  She has been managing her diet and exercise, mentioning a preference for eating salmon and butternut squash regularly. She supplements her diet with protein drinks, particularly Premier Protein, and occasionally has protein bars. She has been on Weight Watchers for years and does not crave sweets. She has returned to the gym, participating in exercise classes that include weightlifting and machines.  She mentions her A1c has decreased slightly, and her recent blood work showed a  previously high white blood cell count has normalized. No fever, poor appetite, or bowel movement issues. She reports a persistent cough and nasal drip, which has improved with treatment. No recent issues with gout.    Past Medical History:  Diagnosis Date   BCC (basal cell carcinoma), face 12/05/2014   Removed from right side of nose [facility] Stefanie Libel and Dr Noe Gens   Cancer [facility])    [facility] right nose   Diabetes mellitus    H/O: stroke 09/11/2011   September 2012 acute right retinal artery occlusion H/o left retinal artery embolic event by imaging H/o blood clot in port during chemotherapy    Multiple thyroid nodules 40/04/8118   Oral lichen planus 09/11/2011   Other and unspecified hyperlipidemia 12/16/2012   Preventative health care 06/21/2013   Rosacea    Valvular heart disease 06/13/2012   Cardiologist Dr Heron Nay of [facility] in [facility]    Past Surgical History:  Procedure Laterality Date   ABDOMINAL HYSTERECTOMY  08/07/2007   total   COLECTOMY  08/07/2007   MOHS SURGERY Right    for [facility] on right nose, Feb 2016   PACEMAKER PLACEMENT Left    Valve replacement 9/22   TONGUE BIOPSY  01/05/2015   negative results   VENTRAL HERNIA REPAIR  06/06/2010   4 lesions found, mesh left in place    Family History  Problem Relation Age of Onset   Transient ischemic attack Mother    Hypertension Mother    Dementia Mother  Diabetes Father        borderline sugar   Coronary artery disease Father    Heart disease Maternal Grandfather    Heart disease Paternal Grandfather     Social History   Socioeconomic History   Marital status: Married    Spouse name: Not on file   Number of children: Not on file   Years of education: Not on file   Highest education level: Some college, no degree  Occupational History   Not on file  Tobacco Use   Smoking status: Never   Smokeless tobacco: Never  Vaping Use   Vaping status: Never Used  Substance  and Sexual Activity   Alcohol use: Not Currently    Comment: rare, social   Drug use: No   Sexual activity: Yes  Other Topics Concern   Not on file  Social History Narrative   Not on file   Social Drivers of Health   Financial Resource Strain: Low Risk  (11/05/2023)   Overall Financial Resource Strain (CARDIA)    Difficulty of Paying Living Expenses: Not hard at all  Food Insecurity: No Food Insecurity (11/05/2023)   Hunger Vital Sign    Worried About Running Out of Food in the Last Year: Never true    Ran Out of Food in the Last Year: Never true  Transportation Needs: No Transportation Needs (11/05/2023)   PRAPARE - Administrator, Civil Service (Medical): No    Lack of Transportation (Non-Medical): No  Physical Activity: Sufficiently Active (11/05/2023)   Exercise Vital Sign    Days of Exercise per Week: 5 days    Minutes of Exercise per Session: 60 min  Stress: No Stress Concern Present (11/05/2023)   [employer] of Occupational Health - Occupational Stress Questionnaire    Feeling of Stress : Not at all  Social Connections: Unknown (11/05/2023)   Social Connection and Isolation Panel [NHANES]    Frequency of Communication with Friends and Family: Once a week    Frequency of Social Gatherings with Friends and Family: Patient declined    Attends Religious Services: Never    Database administrator or Organizations: Yes    Attends Engineer, structural: More than 4 times per year    Marital Status: Married  Catering manager Violence: Not At Risk (05/28/2023)   Received from [facility]   HITS    Over the last 12 months how often did your partner physically hurt you?: Never    Over the last 12 months how often did your partner insult you or talk down to you?: Never    Over the last 12 months how often did your partner threaten you with physical harm?: Never    Over the last 12 months how often did your partner scream or curse at you?: Never    Outpatient  Medications Prior to Visit  Medication Sig Dispense Refill   allopurinol (ZYLOPRIM) 100 MG tablet TAKE 1 TABLET BY MOUTH EVERY DAY 90 tablet 1   B Complex Vitamins (VITAMIN-B COMPLEX PO) Take 1 tablet by mouth daily. Reported on 12/22/2015     blood glucose meter kit and supplies Dispense based on patient and insurance preference. Use up to rwo times daily as directed. DX: E11.9). Aviva Plus meter 1 each 0   cephALEXin (KEFLEX) 500 MG capsule Take 1 capsule (500 mg total) by mouth 3 (three) times daily. 15 capsule 0   Continuous Glucose Receiver (FREESTYLE LIBRE 3 READER) DEVI Use with  sensor to check blood glucose continuously. 1 each 0   Continuous Glucose Sensor (FREESTYLE LIBRE 3 PLUS SENSOR) MISC Use to check blood glucose continuous. Change sensor every 15 days. 2 each 2   flecainide (TAMBOCOR) 50 MG tablet Take 50 mg by mouth 2 (two) times daily.     glucose blood (ACCU-CHEK AVIVA PLUS) test strip Check blood sugars twice daily 100 each 12   hydrochlorothiazide (HYDRODIURIL) 25 MG tablet TAKE 1 TABLET BY MOUTH EVERY DAY 90 tablet 1   hydrocortisone 2.5 % cream Apply topically 2 (two) times daily as needed. 28 g 3   latanoprost (XALATAN) 0.005 % ophthalmic solution Place 1 drop into both eyes daily.  3   metFORMIN (GLUCOPHAGE) 500 MG tablet TAKE 1 TABLET BY MOUTH TWICE A DAY WITH FOOD 180 tablet 1   OVER THE COUNTER MEDICATION Vita Fusion Fiber- 2 daily     rosuvastatin (CRESTOR) 10 MG tablet TAKE 1 TABLET BY MOUTH EVERY DAY 90 tablet 1   valsartan (DIOVAN) 80 MG tablet Take 1 tablet (80 mg total) by mouth daily. 90 tablet 2   No facility-administered medications prior to visit.    Allergies  Allergen Reactions   Codeine Nausea And Vomiting    Review of Systems  Constitutional:  Negative for fever and malaise/fatigue.  HENT:  Positive for congestion.   Eyes:  Negative for blurred vision.  Respiratory:  Positive for cough and sputum production. Negative for shortness of breath.    Cardiovascular:  Negative for chest pain, palpitations and leg swelling.  Gastrointestinal:  Negative for abdominal pain, blood in stool and nausea.  Genitourinary:  Positive for urgency. Negative for dysuria and frequency.  Musculoskeletal:  Positive for falls and joint pain.  Skin:  Negative for rash.  Neurological:  Negative for dizziness, loss of consciousness and headaches.  Endo/Heme/Allergies:  Negative for environmental allergies.  Psychiatric/Behavioral:  Negative for depression. The patient is not nervous/anxious.        Objective:    Physical Exam Constitutional:      General: She is not in acute distress.    Appearance: Normal appearance. She is well-developed. She is not toxic-appearing.  HENT:     Head: Normocephalic and atraumatic.     Right Ear: External ear normal.     Left Ear: External ear normal.     Nose: Nose normal.  Eyes:     General:        Right eye: No discharge.        Left eye: No discharge.     Conjunctiva/sclera: Conjunctivae normal.  Neck:     Thyroid: No thyromegaly.  Cardiovascular:     Rate and Rhythm: Normal rate and regular rhythm.     Heart sounds: Normal heart sounds. No murmur heard. Pulmonary:     Effort: Pulmonary effort is normal. No respiratory distress.     Breath sounds: Normal breath sounds.  Abdominal:     General: Bowel sounds are normal.     Palpations: Abdomen is soft.     Tenderness: There is no abdominal tenderness. There is no guarding.  Musculoskeletal:        General: Normal range of motion.     Cervical back: Neck supple.  Lymphadenopathy:     Cervical: No cervical adenopathy.  Skin:    General: Skin is warm and dry.     Findings: Bruising present.     Comments: Slight old bruising under both eyes  Neurological:     Mental  Status: She is alert and oriented to person, place, and time.  Psychiatric:        Mood and Affect: Mood normal.        Behavior: Behavior normal.        Thought Content: Thought content  normal.        Judgment: Judgment normal.     BP 120/64 (BP Location: Left Arm, Patient Position: Sitting, Cuff Size: Normal)   Pulse (!) 59   Temp 97.8 F (36.6 C) (Oral)   Resp 16   Ht 5\' 2"  (1.575 m)   Wt 144 lb 6.4 oz (65.5 kg)   SpO2 99%   BMI 26.41 kg/m  Wt Readings from Last 3 Encounters:  11/11/23 144 lb 6.4 oz (65.5 kg)  10/25/23 138 lb (62.6 kg)  09/22/23 140 lb (63.5 kg)    Diabetic Foot Exam - Simple   No data filed    Lab Results  Component Value Date   WBC 5.6 11/05/2023   HGB 14.6 11/05/2023   HCT 43.9 11/05/2023   PLT 185.0 11/05/2023   GLUCOSE 125 (H) 11/05/2023   CHOL 143 11/05/2023   TRIG 93.0 11/05/2023   HDL 58.90 11/05/2023   LDLDIRECT 182.4 09/11/2011   LDLCALC 65 11/05/2023   ALT 16 11/05/2023   AST 23 11/05/2023   NA 136 11/05/2023   K 4.3 11/05/2023   CL 96 11/05/2023   CREATININE 0.82 11/05/2023   BUN 25 (H) 11/05/2023   CO2 31 11/05/2023   TSH 3.91 11/05/2023   INR 0.9 09/11/2011   HGBA1C 6.9 (H) 11/05/2023   MICROALBUR <0.7 11/11/2023    Lab Results  Component Value Date   TSH 3.91 11/05/2023   Lab Results  Component Value Date   WBC 5.6 11/05/2023   HGB 14.6 11/05/2023   HCT 43.9 11/05/2023   MCV 91.2 11/05/2023   PLT 185.0 11/05/2023   Lab Results  Component Value Date   NA 136 11/05/2023   K 4.3 11/05/2023   CO2 31 11/05/2023   GLUCOSE 125 (H) 11/05/2023   BUN 25 (H) 11/05/2023   CREATININE 0.82 11/05/2023   BILITOT 0.5 11/05/2023   ALKPHOS 76 11/05/2023   AST 23 11/05/2023   ALT 16 11/05/2023   PROT 7.2 11/05/2023   ALBUMIN 4.4 11/05/2023   CALCIUM 9.6 11/05/2023   ANIONGAP 10 10/25/2023   GFR 68.45 11/05/2023   Lab Results  Component Value Date   CHOL 143 11/05/2023   Lab Results  Component Value Date   HDL 58.90 11/05/2023   Lab Results  Component Value Date   LDLCALC 65 11/05/2023   Lab Results  Component Value Date   TRIG 93.0 11/05/2023   Lab Results  Component Value Date   CHOLHDL  2 11/05/2023   Lab Results  Component Value Date   HGBA1C 6.9 (H) 11/05/2023       Assessment & Plan:  Essential hypertension, benign Assessment & Plan: Well controlled, no changes to meds. Encouraged heart healthy diet such as the DASH diet and exercise as tolerated. Taking HCTZ QOD  Orders: -     Comprehensive metabolic panel with GFR; Future -     CBC with Differential/Platelet; Future -     TSH; Future  Gout, unspecified cause, unspecified chronicity, unspecified site Assessment & Plan: Hydrate and monitor taking Allopurinol qod   Hyperlipidemia, unspecified hyperlipidemia type Assessment & Plan: Encourage heart healthy diet such as MIND or DASH diet, increase exercise, avoid trans fats, simple carbohydrates  and processed foods, consider a krill or fish or flaxseed oil cap daily. Tolerating Rosuvastatin  Orders: -     Lipid panel; Future  Urinary tract infection with hematuria, site unspecified -     Urinalysis, Routine w reflex microscopic -     Urine Culture  DM (diabetes mellitus), type 2 with neurological complications (HCC) -     Hemoglobin A1c; Future -     Microalbumin / creatinine urine ratio    Assessment and Plan Assessment & Plan Fall with residual foot pain Experienced a fall in February with a mobile, non-tender knot on the foot. Differential includes hairline fracture, tendinopathy, or ganglion cyst. Condition improving. - Apply Voltaren or Aspercreme twice daily. - Wrap foot if standing for long periods. - Use ice to reduce inflammation. - Monitor for changes in swelling, pain, redness, or heat. - Contact provider if symptoms worsen for potential x-ray or orthopedic referral.  Urinary Tract Infection (UTI) Recently treated for UTI with hematuria. No current symptoms but increased risk for asymptomatic bacteriuria due to age. - Obtain urine sample to check for clearance of infection. - Advise on hydration and cranberry tablets for prevention. -  Recommend daily probiotic.  Diabetes Mellitus, Type 2 A1c decreased, indicating improved glycemic control. Engaging in regular exercise and dietary management. - Continue current diabetes management plan. - Encourage regular exercise and balanced diet with focus on protein intake.  Hypertension Managed with valsartan. Previous switch from lisinopril due to cough. Valsartan is kidney protective in diabetes. - Continue valsartan. - Monitor for escalation of cough symptoms. - Consider hydralazine if cough worsens.  Post-nasal drip Chronic post-nasal drip managed with nasal spray. ENT plans procedure for sinus drainage. - Continue nasal spray. - Proceed with ENT-recommended procedure.  Follow-up To follow up in 4-5 months for routine evaluation. New nurse practitioner, Shanda Bumps, will assist with care management. - Schedule follow-up appointment in 4-5 months. - Order blood work prior to next visit. - Utilize MyChart for interim concerns or medication adjustments.     Danise Edge, MD

## 2023-11-11 NOTE — Patient Instructions (Addendum)
 Azo cranberry tabs daily as needed Probiotic dailyDehydration, Adult Dehydration is a condition in which there is not enough water or other fluids in the body. This happens when a person loses more fluids than they take in. Important organs, such as the kidneys, brain, and heart, cannot function without a proper amount of fluids. Any loss of fluids from the body can lead to dehydration. Dehydration can be mild, moderate, or severe. It should be treated right away to prevent it from becoming severe. What are the causes? Dehydration may be caused by: Health conditions, such as diarrhea, vomiting, fever, infection, or sweating or urinating a lot. Not drinking enough fluids. Certain medicines, such as medicines that remove excess fluid from the body (diuretics). Lack of safe drinking water. Not being able to get enough water and food. What increases the risk? The following factors may make you more likely to develop this condition: Having a long-term (chronic) illness that has not been treated properly, such as diabetes, heart disease, or kidney disease. Being 44 years of age or older. Having a disability. Living in a place that is high in altitude, where thinner, drier air causes more fluid loss. Doing exercises that put stress on your body for a long time (endurance sports). Being active in a hot climate. What are the signs or symptoms? Symptoms of dehydration depend on how severe it is. Mild or moderate dehydration Thirst. Dry lips or dry mouth. Dizziness or light-headedness. Muscle cramps. Dark urine. Urine may be the color of tea. Less urine or tears produced than usual. Headache. Severe dehydration Changes in skin. Your skin may be cold and clammy, blotchy, or pale. Your skin also may not return to normal after being lightly pinched and released. Little or no tears, urine, or sweat. Rapid breathing and low blood pressure. Your pulse may be weak or may be faster than 100 beats per  minute when you are sitting still. Other changes, such as: Feeling very thirsty. Sunken eyes. Cold hands and feet. Confusion. Being very tired (lethargic) or having trouble waking from sleep. Short-term weight loss. Loss of consciousness. How is this diagnosed? This condition is diagnosed based on your symptoms and a physical exam. You may have blood and urine tests to help confirm the diagnosis. How is this treated? Treatment for this condition depends on how severe it is. Treatment should be started right away. Do not wait until dehydration becomes severe. Severe dehydration is an emergency and needs to be treated in a hospital. Mild or moderate dehydration can be treated at home. You may be asked to: Drink more fluids. Drink an oral rehydration solution (ORS). This drink restores fluids, salts, and minerals in the blood (electrolytes). Stop any activities that caused dehydration, such as exercise. Cool off with cool compresses, cool mist, or cool fluids, if heat or too much sweat caused your condition. Take medicine to treat fever, if fever caused your condition. Take medicine to treat nausea and diarrhea, if vomiting or diarrhea caused your condition. Severe dehydration can be treated: With IV fluids. By correcting abnormal levels of electrolytes in your body. By treating the underlying cause of dehydration. Follow these instructions at home: Oral rehydration solution If told by your health care provider, drink an ORS: Make an ORS by following instructions on the package. Start by drinking small amounts, about  cup (120 mL) every 5-10 minutes. Slowly increase how much you drink until you have taken the amount recommended by your health care provider.  Eating and drinking  Drink enough clear fluid to keep your urine pale yellow. If you were told to drink an ORS, finish the ORS first and then start slowly drinking other clear fluids. Drink fluids such as: Water. Do not drink only  water. Doing that can lead to hyponatremia, which is having too little salt (sodium) in the body. Water from ice chips you suck on. Diluted fruit juice. This is fruit juice that you have added water to. Low-calorie sports drinks. Eat foods that contain a healthy balance of electrolytes, such as bananas, oranges, potatoes, tomatoes, and spinach. Do not drink alcohol. Avoid the following: Drinks that contain a lot of sugar. These include high-calorie sports drinks, fruit juice that is not diluted, and soda. Caffeine. Foods that are greasy or contain a lot of fat or sugar. General instructions Take over-the-counter and prescription medicines only as told by your health care provider. Do not take sodium tablets. Doing that can lead to having too much sodium in the body (hypernatremia). Return to your normal activities as told by your health care provider. Ask your health care provider what activities are safe for you. Keep all follow-up visits. Your health care provider may need to check your progress and suggest new ways to treat your condition. Contact a health care provider if: You have muscle cramps, pain, or discomfort, such as: Pain in your abdomen and the pain gets worse or stays in one area. Stiff neck. You have a rash. You are more irritable than usual. You are sleepier or have a harder time waking. You feel weak or dizzy. You feel very thirsty. Get help right away if: You have symptoms of severe dehydration. You vomit every time you eat or drink. Your vomiting gets worse, does not go away, or includes blood or green matter (bile). You are getting treatment but symptoms are getting worse. You have a fever. You have a severe headache. You have: Diarrhea that gets worse or does not go away. Blood in your stool. This may cause stool to look black and tarry. Not urinating, or urinating only a small amount of very dark urine, within 6-8 hours. You have trouble breathing. These  symptoms may be an emergency. Get help right away. Do not wait to see if the symptoms will go away. Do not drive yourself to the hospital. Call 911. This information is not intended to replace advice given to you by your health care provider. Make sure you discuss any questions you have with your health care provider. Document Revised: 02/19/2022 Document Reviewed: 02/19/2022 Elsevier Patient Education  2024 [employer].

## 2023-11-12 ENCOUNTER — Telehealth: Payer: Self-pay | Admitting: Emergency Medicine

## 2023-11-12 ENCOUNTER — Ambulatory Visit: Payer: Medicare Other | Admitting: Family Medicine

## 2023-11-12 LAB — URINE CULTURE
MICRO NUMBER:: 16296902
Result:: NO GROWTH
SPECIMEN QUALITY:: ADEQUATE

## 2023-11-12 NOTE — Telephone Encounter (Signed)
 Patient would like to receive her tetanus shot. She would like to know if she should get it downstairs or make a nurse visit with [place]?

## 2023-11-13 NOTE — Telephone Encounter (Signed)
 Pt called and lvm to schedule a nv for tdap

## 2023-12-12 ENCOUNTER — Encounter: Payer: Self-pay | Admitting: Family Medicine

## 2023-12-27 ENCOUNTER — Other Ambulatory Visit: Payer: Self-pay | Admitting: Family Medicine

## 2024-01-31 ENCOUNTER — Telehealth: Payer: Self-pay | Admitting: Family Medicine

## 2024-01-31 NOTE — Telephone Encounter (Unsigned)
 Copied from CRM [id number]. Topic: Clinical - Medication Refill >> Jan 31, 2024  9:28 AM Chasity T wrote: Medication: allopurinol  (ZYLOPRIM ) 100 MG tablet   Has the patient contacted their pharmacy? No (Agent: If no, request that the patient contact the pharmacy for the refill. If patient does not wish to contact the pharmacy document the reason why and proceed with request.) (Agent: If yes, when and what did the pharmacy advise?)  This is the patient's preferred pharmacy:  [id number] - [place], [place] - [address] [place] [address] Phone: [phone] Fax: [phone]  Is this the correct pharmacy for this prescription? Yes If no, delete pharmacy and type the correct one.   Has the prescription been filled recently? No  Is the patient out of the medication? Yes  Has the patient been seen for an appointment in the last year OR does the patient have an upcoming appointment? Yes  Can we respond through MyChart? Yes  Agent: Please be advised that Rx refills may take up to 3 business days. We ask that you follow-up with your pharmacy.

## 2024-02-14 ENCOUNTER — Encounter: Payer: Self-pay | Admitting: Family Medicine

## 2024-02-14 MED ORDER — ALLOPURINOL 100 MG PO TABS: 100.0000 mg | ORAL_TABLET | Freq: Every day | ORAL | 3 refills | Status: AC

## 2024-03-08 ENCOUNTER — Encounter (HOSPITAL_BASED_OUTPATIENT_CLINIC_OR_DEPARTMENT_OTHER): Payer: Self-pay | Admitting: Emergency Medicine

## 2024-03-08 ENCOUNTER — Encounter: Payer: Self-pay | Admitting: Family Medicine

## 2024-03-08 ENCOUNTER — Other Ambulatory Visit: Payer: Self-pay

## 2024-03-08 ENCOUNTER — Emergency Department (HOSPITAL_BASED_OUTPATIENT_CLINIC_OR_DEPARTMENT_OTHER)
Admission: EM | Admit: 2024-03-08 | Discharge: 2024-03-08 | Disposition: A | Discharge status: Against Medical Advice | Attending: Emergency Medicine | Admitting: Emergency Medicine

## 2024-03-08 DIAGNOSIS — R399 Unspecified symptoms and signs involving the genitourinary system: Secondary | ICD-10-CM

## 2024-03-08 DIAGNOSIS — Z5321 Procedure and treatment not carried out due to patient leaving prior to being seen by health care provider: Secondary | ICD-10-CM | POA: Diagnosis not present

## 2024-03-08 DIAGNOSIS — R300 Dysuria: Secondary | ICD-10-CM | POA: Diagnosis present

## 2024-03-08 LAB — URINALYSIS, MICROSCOPIC (REFLEX)
RBC / HPF: >50 RBC/hpf (ref 0–5)
Squamous Epithelial / HPF: NONE SEEN /HPF (ref 0–5)

## 2024-03-08 LAB — URINALYSIS, ROUTINE W REFLEX MICROSCOPIC

## 2024-03-08 MED ORDER — CEPHALEXIN 500 MG PO CAPS: 500.0000 mg | ORAL_CAPSULE | Freq: Four times a day (QID) | ORAL | 0 refills | Status: AC

## 2024-03-08 NOTE — ED Provider Notes (Signed)
 Left prior to my evaluation. Presented with dysuria, symptoms similar to when she previously had UTI. Attempted to call without answer. UA with RBC, WBC-will treat with keflex  for UTI. Encourage return for any concerns, PCP follow up.    Dreama Longs, MD 03/08/24 [phone]

## 2024-03-08 NOTE — ED Triage Notes (Signed)
 Pt c/o dysuria; sts feels similar to when she had a UTI a few months ago

## 2024-03-09 NOTE — Telephone Encounter (Signed)
 Pt went to ED on 03/09/2024 for UTI and prescribed kelfex

## 2024-03-09 NOTE — Telephone Encounter (Signed)
 Pt went to ED on 03/08/24 for urine issues , prescribed keflex 

## 2024-03-10 ENCOUNTER — Telehealth: Payer: Self-pay

## 2024-03-10 LAB — URINE CULTURE: Culture: <10000 — AB

## 2024-03-10 NOTE — Telephone Encounter (Signed)
 Copied from CRM [id number]. Topic: Clinical - Request for Lab/Test Order >> Mar 10, 2024 11:21 AM Rea BROCKS wrote: Reason for CRM: Patient believes she is dealing with a uti and she would like a urine test/analysis lab added to the labs that she will be taking on this Thursday's lab appointment, if possible.   Patient contact is [phone] (M)

## 2024-03-11 NOTE — Telephone Encounter (Signed)
 Called patient and no answer left vm to return call

## 2024-03-12 ENCOUNTER — Ambulatory Visit: Payer: Self-pay | Admitting: Family

## 2024-03-12 ENCOUNTER — Other Ambulatory Visit (INDEPENDENT_AMBULATORY_CARE_PROVIDER_SITE_OTHER)

## 2024-03-12 DIAGNOSIS — I10 Essential (primary) hypertension: Secondary | ICD-10-CM | POA: Diagnosis not present

## 2024-03-12 DIAGNOSIS — E1149 Type 2 diabetes mellitus with other diabetic neurological complication: Secondary | ICD-10-CM | POA: Diagnosis not present

## 2024-03-12 DIAGNOSIS — E785 Hyperlipidemia, unspecified: Secondary | ICD-10-CM

## 2024-03-12 LAB — CBC WITH DIFFERENTIAL/PLATELET
Basophils Absolute: 0 K/uL (ref 0.0–0.1)
Basophils Relative: 0.4 % (ref 0.0–3.0)
Eosinophils Absolute: 0.1 K/uL (ref 0.0–0.7)
Eosinophils Relative: 2 % (ref 0.0–5.0)
HCT: 44.3 % (ref 36.0–46.0)
Hemoglobin: 14.5 g/dL (ref 12.0–15.0)
Lymphocytes Relative: 29.6 % (ref 12.0–46.0)
Lymphs Abs: 1.5 K/uL (ref 0.7–4.0)
MCHC: 32.8 g/dL (ref 30.0–36.0)
MCV: 90 fl (ref 78.0–100.0)
Monocytes Absolute: 0.4 K/uL (ref 0.1–1.0)
Monocytes Relative: 7.4 % (ref 3.0–12.0)
Neutro Abs: 3.1 K/uL (ref 1.4–7.7)
Neutrophils Relative %: 60.6 % (ref 43.0–77.0)
Platelets: 152 K/uL (ref 150.0–400.0)
RBC: 4.92 Mil/uL (ref 3.87–5.11)
RDW: 14.2 % (ref 11.5–15.5)
WBC: 5.2 K/uL (ref 4.0–10.5)

## 2024-03-12 LAB — LIPID PANEL
Cholesterol: 143 mg/dL (ref 0–200)
HDL: 59.5 mg/dL (ref >39.00)
LDL Cholesterol: 70 mg/dL (ref 0–99)
NonHDL: 83.02
Total CHOL/HDL Ratio: 2
Triglycerides: 63 mg/dL (ref 0.0–149.0)
VLDL: 12.6 mg/dL (ref 0.0–40.0)

## 2024-03-12 LAB — COMPREHENSIVE METABOLIC PANEL WITH GFR
ALT: 21 U/L (ref 0–35)
AST: 25 U/L (ref 0–37)
Albumin: 4.3 g/dL (ref 3.5–5.2)
Alkaline Phosphatase: 79 U/L (ref 39–117)
BUN: 18 mg/dL (ref 6–23)
CO2: 30 meq/L (ref 19–32)
Calcium: 9.5 mg/dL (ref 8.4–10.5)
Chloride: 99 meq/L (ref 96–112)
Creatinine, Ser: 0.73 mg/dL (ref 0.40–1.20)
GFR: 78.5 mL/min (ref >60.00)
Glucose, Bld: 114 mg/dL — ABNORMAL HIGH (ref 70–99)
Potassium: 5.1 meq/L (ref 3.5–5.1)
Sodium: 140 meq/L (ref 135–145)
Total Bilirubin: 0.5 mg/dL (ref 0.2–1.2)
Total Protein: 6.9 g/dL (ref 6.0–8.3)

## 2024-03-12 LAB — HEMOGLOBIN A1C: Hgb A1c MFr Bld: 7.2 % — ABNORMAL HIGH (ref 4.6–6.5)

## 2024-03-12 LAB — TSH: TSH: 3.22 u[IU]/mL (ref 0.35–5.50)

## 2024-03-13 NOTE — Progress Notes (Signed)
 Called patient and no answer left vm to return call

## 2024-03-13 NOTE — Progress Notes (Signed)
Patient reviewed via MyChart.

## 2024-03-17 ENCOUNTER — Ambulatory Visit (INDEPENDENT_AMBULATORY_CARE_PROVIDER_SITE_OTHER): Admitting: Family Medicine

## 2024-03-17 ENCOUNTER — Encounter: Payer: Self-pay | Admitting: Family Medicine

## 2024-03-17 VITALS — BP 83/67 | HR 65 | Ht 62.0 in | Wt 139.0 lb

## 2024-03-17 DIAGNOSIS — M109 Gout, unspecified: Secondary | ICD-10-CM

## 2024-03-17 DIAGNOSIS — I10 Essential (primary) hypertension: Secondary | ICD-10-CM | POA: Diagnosis not present

## 2024-03-17 DIAGNOSIS — E785 Hyperlipidemia, unspecified: Secondary | ICD-10-CM | POA: Diagnosis not present

## 2024-03-17 DIAGNOSIS — I38 Endocarditis, valve unspecified: Secondary | ICD-10-CM

## 2024-03-17 DIAGNOSIS — E1149 Type 2 diabetes mellitus with other diabetic neurological complication: Secondary | ICD-10-CM | POA: Diagnosis not present

## 2024-03-17 DIAGNOSIS — Z7984 Long term (current) use of oral hypoglycemic drugs: Secondary | ICD-10-CM

## 2024-03-17 MED ORDER — VALSARTAN 40 MG PO TABS: 40.0000 mg | ORAL_TABLET | Freq: Every day | ORAL | 0 refills | Status: DC

## 2024-03-17 MED ORDER — AMOXICILLIN 500 MG PO CAPS: 2000.0000 mg | ORAL_CAPSULE | Freq: Every day | ORAL | 0 refills | Status: AC | PRN

## 2024-03-17 MED ORDER — HYDROCHLOROTHIAZIDE 12.5 MG PO TABS: 12.5000 mg | ORAL_TABLET | Freq: Every day | ORAL | 0 refills | Status: DC

## 2024-03-17 NOTE — Assessment & Plan Note (Signed)
 Asymptomatic. Refilled PRN amoxicillin .

## 2024-03-17 NOTE — Assessment & Plan Note (Signed)
 Recent labs stable. Continue current regimen and heart healthy lifestyle.

## 2024-03-17 NOTE — Assessment & Plan Note (Signed)
 Type 2 diabetes mellitus with well-managed blood glucose levels. Morning blood sugars typically 117-120 mg/dL, but can drop to 74 mg/dL post-exercise. Recent A1c 7.2%. - Continue metformin  500 mg twice daily. - Advise on dietary caution and consider eating a snack before or during exercise to prevent hypoglycemia. - Monitor blood glucose levels closely, especially in relation to exercise.

## 2024-03-17 NOTE — Progress Notes (Signed)
 Established Patient Office Visit  Subjective   Patient ID: Andrea Calderon, female    DOB: Feb 22, 1946  Age: 78 y.o. MRN: [id number]  Chief Complaint  Patient presents with   Medical Management of Chronic Issues    HPI   Discussed the use of AI scribe software for clinical note transcription with the patient, who gave verbal consent to proceed.  History of Present Illness Andrea Calderon is a 78 year old female with hypertension and diabetes who presents for routine follow-up, reporting some dizziness/fogginess with position changes.  She has been experiencing dizziness and lightheadedness. The dizziness occurs when changing positions, particularly when bending over and standing up. These symptoms have been ongoing for an unspecified duration.  Her current medication regimen includes valsartan  80 mg once daily. She rarely checks her blood pressure at home. She also takes hydrochlorothiazide  25 mg daily, alternating with allopurinol  every other day. She has difficulty remembering the reason for this adjustment.  For diabetes management, she monitors her blood sugar levels, which are typically around 117-120 mg/dL in the mornings. She takes metformin  500 mg twice daily and notes that her blood sugar drops to around 74 mg/dL after exercising. She is concerned about these fluctuations and attributes some symptoms to low blood sugar, but most recent A1c went up to 7.2% from 6.9%.  Her past medical history includes a pacemaker implantation, with a battery life of seven and a half years remaining. She has a history of skin cancer on her head, which was last reported as having a good reading. No recent gout flares.  She is currently taking flecainide 50 mg twice daily, eye drops, a cholesterol medication at 10 mg, and a baby aspirin. She mentions a needed refill on her PRN amoxicillin  prior to dental procedures.       ROS All review of systems negative except what is listed in the HPI     Objective:     BP (!) 83/67   Pulse 65   Ht 5' 2 (1.575 m)   Wt 139 lb (63 kg)   SpO2 99%   BMI 25.42 kg/m    Physical Exam Vitals reviewed.  Constitutional:      Appearance: Normal appearance.  Cardiovascular:     Rate and Rhythm: Normal rate and regular rhythm.  Pulmonary:     Effort: Pulmonary effort is normal.     Breath sounds: Normal breath sounds.  Skin:    General: Skin is warm and dry.  Neurological:     Mental Status: She is alert and oriented to person, place, and time.  Psychiatric:        Mood and Affect: Mood normal.        Behavior: Behavior normal.        Thought Content: Thought content normal.        Judgment: Judgment normal.        No results found for any visits on 03/17/24.    The ASCVD Risk score (Arnett DK, et al., 2019) failed to calculate for the following reasons:   Risk score cannot be calculated because patient has a medical history suggesting prior/existing ASCVD    Assessment & Plan:   Problem List Items Addressed This Visit       Active Problems   DM (diabetes mellitus), type 2 with neurological complications (HCC)   Type 2 diabetes mellitus with well-managed blood glucose levels. Morning blood sugars typically 117-120 mg/dL, but can drop to 74 mg/dL post-exercise. Recent A1c  7.2%. - Continue metformin  500 mg twice daily. - Advise on dietary caution and consider eating a snack before or during exercise to prevent hypoglycemia. - Monitor blood glucose levels closely, especially in relation to exercise.      Relevant Medications   aspirin EC 81 MG tablet   valsartan  (DIOVAN ) 40 MG tablet   Gout, unspecified   Stable. No recent flares. Continue allopurinol .       Essential hypertension, benign - Primary   Hypertension management complicated by medication-induced hypotension and orthostatic symptoms.  Symptoms suggest need for dose adjustment. - Reduce valsartan  to 40 mg daily. Send prescription for 40 mg tablets. -  Instead of taking HCTZ 25 mg every other day, recommend reducing to 12.5 mg daily. - Schedule a nurse visit in 2-3 weeks to check blood pressure. - Advise on slow position changes and maintaining hydration to manage orthostatic symptoms. - Advise monitoring blood pressure at home and keeping a log of readings and symptoms.      Relevant Medications   aspirin EC 81 MG tablet   hydrochlorothiazide  (HYDRODIURIL ) 12.5 MG tablet   valsartan  (DIOVAN ) 40 MG tablet   Valvular heart disease   Asymptomatic. Refilled PRN amoxicillin .       Relevant Medications   aspirin EC 81 MG tablet   hydrochlorothiazide  (HYDRODIURIL ) 12.5 MG tablet   valsartan  (DIOVAN ) 40 MG tablet   amoxicillin  (AMOXIL ) 500 MG capsule   Hyperlipidemia   Recent labs stable. Continue current regimen and heart healthy lifestyle.       Relevant Medications   aspirin EC 81 MG tablet   hydrochlorothiazide  (HYDRODIURIL ) 12.5 MG tablet   valsartan  (DIOVAN ) 40 MG tablet       Return in about 2 weeks (around 03/31/2024) for BP check with nurse; routine PCP follow-up 4-5 months. Andrea Waddell KATHEE Almarie, NP

## 2024-03-17 NOTE — Assessment & Plan Note (Signed)
 Stable.  No recent flares.  Continue allopurinol.

## 2024-03-17 NOTE — Assessment & Plan Note (Signed)
 Hypertension management complicated by medication-induced hypotension and orthostatic symptoms.  Symptoms suggest need for dose adjustment. - Reduce valsartan  to 40 mg daily. Send prescription for 40 mg tablets. - Instead of taking HCTZ 25 mg every other day, recommend reducing to 12.5 mg daily. - Schedule a nurse visit in 2-3 weeks to check blood pressure. - Advise on slow position changes and maintaining hydration to manage orthostatic symptoms. - Advise monitoring blood pressure at home and keeping a log of readings and symptoms.

## 2024-03-18 LAB — HM MAMMOGRAPHY

## 2024-03-23 ENCOUNTER — Encounter: Payer: Self-pay | Admitting: Family Medicine

## 2024-03-27 ENCOUNTER — Other Ambulatory Visit: Payer: Self-pay | Admitting: Family Medicine

## 2024-03-31 ENCOUNTER — Ambulatory Visit (INDEPENDENT_AMBULATORY_CARE_PROVIDER_SITE_OTHER)

## 2024-03-31 DIAGNOSIS — I10 Essential (primary) hypertension: Secondary | ICD-10-CM | POA: Diagnosis not present

## 2024-03-31 NOTE — Progress Notes (Signed)
 Pt here for Blood pressure check per Andrea Calderon on 03/17/24: Return in about 2 weeks (around 03/31/2024) for BP check with nurse   Pt currently takes: Valsartan  40 mg & hydrochlorothiazide  12.5 mg   Pt reports compliance with medication.  BP today @ = 128/72 HR = 62  Pt advised per Andrea Calderon to continue current medications and keep follow-up appointment with Dr. Domenica.

## 2024-04-28 ENCOUNTER — Encounter: Payer: Self-pay | Admitting: Family Medicine

## 2024-05-04 ENCOUNTER — Other Ambulatory Visit (HOSPITAL_BASED_OUTPATIENT_CLINIC_OR_DEPARTMENT_OTHER): Payer: Self-pay

## 2024-05-04 MED ORDER — FLUZONE HIGH-DOSE 0.5 ML IM SUSY
0.5000 mL | PREFILLED_SYRINGE | Freq: Once | INTRAMUSCULAR | 0 refills | Status: AC
  Filled 2024-05-04: qty 0.5, 1d supply, fill #0

## 2024-06-09 ENCOUNTER — Telehealth: Payer: Self-pay | Admitting: Family Medicine

## 2024-06-09 NOTE — Telephone Encounter (Signed)
 Copied from CRM [id number]. Topic: Medicare AWV >> Jun 09, 2024 10:13 AM Nathanel DEL wrote: Reason for CRM: Called LVM 06/09/2024 to schedule AWV. Please schedule in office only DUE TO [employer]  Nathanel Paschal; Care Guide Ambulatory Clinical Support [place] [facility] Direct Dial: [phone]

## 2024-06-12 ENCOUNTER — Other Ambulatory Visit: Payer: Self-pay | Admitting: Family Medicine

## 2024-06-12 DIAGNOSIS — I10 Essential (primary) hypertension: Secondary | ICD-10-CM

## 2024-06-15 ENCOUNTER — Telehealth: Payer: Self-pay

## 2024-06-15 NOTE — Telephone Encounter (Signed)
 Copied from CRM [id number]. Topic: Clinical - Prescription Issue >> Jun 15, 2024 10:49 AM Ashley R wrote: Reason for CRM: US  Med need new form submitted for test strips. Fax [phone] callback [phone]  From was faxed in on 10/28

## 2024-06-15 NOTE — Telephone Encounter (Signed)
 Awaiting form

## 2024-07-03 ENCOUNTER — Other Ambulatory Visit: Payer: Self-pay | Admitting: Family Medicine

## 2024-08-04 ENCOUNTER — Other Ambulatory Visit (HOSPITAL_BASED_OUTPATIENT_CLINIC_OR_DEPARTMENT_OTHER): Payer: Self-pay

## 2024-08-04 MED ORDER — COMIRNATY 30 MCG/0.3ML IM SUSY
0.3000 mL | PREFILLED_SYRINGE | Freq: Once | INTRAMUSCULAR | 0 refills | Status: AC
  Filled 2024-08-04: qty 0.3, 1d supply, fill #0

## 2024-08-14 ENCOUNTER — Telehealth: Payer: Self-pay

## 2024-08-14 DIAGNOSIS — E1149 Type 2 diabetes mellitus with other diabetic neurological complication: Secondary | ICD-10-CM

## 2024-08-14 DIAGNOSIS — I10 Essential (primary) hypertension: Secondary | ICD-10-CM

## 2024-08-14 DIAGNOSIS — E785 Hyperlipidemia, unspecified: Secondary | ICD-10-CM

## 2024-08-14 NOTE — Telephone Encounter (Signed)
 Copied from CRM [id number]. Topic: Clinical - Request for Lab/Test Order >> Aug 14, 2024  3:50 PM Avram MATSU wrote: Reason for CRM: patient has a 5 month follow up and would like labs added

## 2024-08-14 NOTE — Telephone Encounter (Signed)
 Patient was scheduled on 08/18/2024 @ 7:45 AM for labs.

## 2024-08-18 ENCOUNTER — Other Ambulatory Visit

## 2024-08-18 ENCOUNTER — Ambulatory Visit: Payer: Self-pay | Admitting: Family Medicine

## 2024-08-18 DIAGNOSIS — E1149 Type 2 diabetes mellitus with other diabetic neurological complication: Secondary | ICD-10-CM | POA: Diagnosis not present

## 2024-08-18 DIAGNOSIS — E039 Hypothyroidism, unspecified: Secondary | ICD-10-CM

## 2024-08-18 DIAGNOSIS — I10 Essential (primary) hypertension: Secondary | ICD-10-CM | POA: Diagnosis not present

## 2024-08-18 DIAGNOSIS — E785 Hyperlipidemia, unspecified: Secondary | ICD-10-CM | POA: Diagnosis not present

## 2024-08-18 LAB — MICROALBUMIN / CREATININE URINE RATIO
Creatinine,U: 34 mg/dL
Microalb Creat Ratio: UNDETERMINED mg/g (ref 0.0–30.0)
Microalb, Ur: <0.7 mg/dL

## 2024-08-18 LAB — COMPREHENSIVE METABOLIC PANEL WITH GFR
ALT: 17 U/L (ref 3–35)
AST: 24 U/L (ref 5–37)
Albumin: 4.4 g/dL (ref 3.5–5.2)
Alkaline Phosphatase: 78 U/L (ref 39–117)
BUN: 18 mg/dL (ref 6–23)
CO2: 29 meq/L (ref 19–32)
Calcium: 9.8 mg/dL (ref 8.4–10.5)
Chloride: 100 meq/L (ref 96–112)
Creatinine, Ser: 0.88 mg/dL (ref 0.40–1.20)
GFR: 62.54 mL/min
Glucose, Bld: 134 mg/dL — ABNORMAL HIGH (ref 70–99)
Potassium: 4.7 meq/L (ref 3.5–5.1)
Sodium: 137 meq/L (ref 135–145)
Total Bilirubin: 0.5 mg/dL (ref 0.2–1.2)
Total Protein: 7.3 g/dL (ref 6.0–8.3)

## 2024-08-18 LAB — HEMOGLOBIN A1C: Hgb A1c MFr Bld: 6.8 % — ABNORMAL HIGH (ref 4.6–6.5)

## 2024-08-18 LAB — LIPID PANEL
Cholesterol: 145 mg/dL (ref 28–200)
HDL: 65.7 mg/dL
LDL Cholesterol: 60 mg/dL (ref 10–99)
NonHDL: 79.5
Total CHOL/HDL Ratio: 2
Triglycerides: 99 mg/dL (ref 10.0–149.0)
VLDL: 19.8 mg/dL (ref 0.0–40.0)

## 2024-08-18 LAB — CBC WITH DIFFERENTIAL/PLATELET
Basophils Absolute: 0.1 K/uL (ref 0.0–0.1)
Basophils Relative: 0.8 % (ref 0.0–3.0)
Eosinophils Absolute: 0.1 K/uL (ref 0.0–0.7)
Eosinophils Relative: 2.1 % (ref 0.0–5.0)
HCT: 44 % (ref 36.0–46.0)
Hemoglobin: 14.7 g/dL (ref 12.0–15.0)
Lymphocytes Relative: 39.8 % (ref 12.0–46.0)
Lymphs Abs: 2.6 K/uL (ref 0.7–4.0)
MCHC: 33.4 g/dL (ref 30.0–36.0)
MCV: 89.6 fl (ref 78.0–100.0)
Monocytes Absolute: 0.5 K/uL (ref 0.1–1.0)
Monocytes Relative: 7.5 % (ref 3.0–12.0)
Neutro Abs: 3.2 K/uL (ref 1.4–7.7)
Neutrophils Relative %: 49.8 % (ref 43.0–77.0)
Platelets: 174 K/uL (ref 150.0–400.0)
RBC: 4.91 Mil/uL (ref 3.87–5.11)
RDW: 14.2 % (ref 11.5–15.5)
WBC: 6.5 K/uL (ref 4.0–10.5)

## 2024-08-18 LAB — TSH: TSH: 6 u[IU]/mL — ABNORMAL HIGH (ref 0.35–5.50)

## 2024-08-19 ENCOUNTER — Ambulatory Visit: Payer: Self-pay | Admitting: Family Medicine

## 2024-08-19 ENCOUNTER — Ambulatory Visit (INDEPENDENT_AMBULATORY_CARE_PROVIDER_SITE_OTHER)

## 2024-08-19 DIAGNOSIS — E039 Hypothyroidism, unspecified: Secondary | ICD-10-CM

## 2024-08-19 LAB — T4, FREE: Free T4: 0.94 ng/dL (ref 0.60–1.60)

## 2024-08-19 NOTE — Assessment & Plan Note (Signed)
Hydrate and monitor taking Allopurinol qod

## 2024-08-19 NOTE — Assessment & Plan Note (Signed)
 hgba1c acceptable, minimize simple carbs. Increase exercise as tolerated. Continue current meds

## 2024-08-19 NOTE — Progress Notes (Signed)
 "  Subjective:    Patient ID: Andrea Calderon, female    DOB: [birth year], 79 y.o.   MRN: [id number]  Chief Complaint  Patient presents with   Medical Management of Chronic Issues    Patient presents today for a 5 month follow-up.   Quality Metric Gaps    AWV, foot & eye exam, TDAP vaccine    HPI Discussed the use of AI scribe software for clinical note transcription with the patient, who gave verbal consent to proceed.  History of Present Illness Andrea Calderon is a 79 year old female who presents for a follow-up visit to discuss her health concerns, including arthritis and diabetes management.  She is experiencing stress due to her husband Charlie's health issues, particularly a wound on his foot that has taken seven months to heal and has finally developed a scab.  In February of the previous year, she experienced a fall which led to a workup revealing vascular calcifications in the carotid siphons and mild cerebral volume loss with associated paraventricular white matter changes. She is concerned about the potential for dementia due to these findings.  She has arthritis in her hands, particularly noting a 'trigger finger' that locks up. She has been using a stress ball to manage symptoms, which has helped reduce the frequency of locking.  She has a history of diabetes and notes her A1c has decreased to 6.8 with minimal use of metformin , which she takes only when her morning blood sugar is above 115 mg/dL.  She mentions a history of thyroid  nodules and is awaiting results from a recent TSH test. She has noticed some lumps in her neck area, which she attributes to lymph nodes.  She has a history of basal cell carcinoma on her face and has undergone Mohs surgery. She also had a biopsy on her tongue three months ago due to concerns about oral cancer, which returned normal results.  She uses Senokot occasionally to manage bowel movements, which she notes have slowed with age.    Past  Medical History:  Diagnosis Date   Allergy coedine   BCC (basal cell carcinoma), face 12/05/2014   Removed from right side of nose via [facility] Lorie and Dr Zulema   Cancer [facility])    [facility] right nose   Diabetes mellitus    H/O: stroke 09/11/2011   September 2012 acute right retinal artery occlusion H/o left retinal artery embolic event by imaging H/o blood clot in port during chemotherapy    Heart murmur    Multiple thyroid  nodules 88/91/7986   Oral lichen planus 09/11/2011   Other and unspecified hyperlipidemia 12/16/2012   Preventative health care 06/21/2013   Rosacea    Valvular heart disease 06/13/2012   Cardiologist Dr Collier of [facility] in [facility]    Past Surgical History:  Procedure Laterality Date   ABDOMINAL HYSTERECTOMY  08/07/2007   total   CARDIAC VALVE REPLACEMENT     COLECTOMY  08/07/2007   HERNIA REPAIR     MOHS SURGERY Right    for [facility] on right nose, Feb 2016   PACEMAKER PLACEMENT Left    Valve replacement 9/22   TONGUE BIOPSY  01/05/2015   negative results   VENTRAL HERNIA REPAIR  06/06/2010   4 lesions found, mesh left in place    Family History  Problem Relation Age of Onset   Transient ischemic attack Mother    Hypertension Mother    Dementia  Mother    Cancer Mother    Obesity Mother    Diabetes Father        borderline sugar   Coronary artery disease Father    Heart disease Maternal Grandfather    Heart disease Paternal Grandfather     Social History   Socioeconomic History   Marital status: Married    Spouse name: Not on file   Number of children: Not on file   Years of education: Not on file   Highest education level: Some college, no degree  Occupational History   Not on file  Tobacco Use   Smoking status: Never   Smokeless tobacco: Never  Vaping Use   Vaping status: Never Used  Substance and Sexual Activity   Alcohol use: Not Currently    Comment: rare, social   Drug use: No    Sexual activity: Yes  Other Topics Concern   Not on file  Social History Narrative   Not on file   Social Drivers of Health   Tobacco Use: Low Risk (08/20/2024)   Patient History    Smoking Tobacco Use: Never    Smokeless Tobacco Use: Never    Passive Exposure: Not on file  Financial Resource Strain: Low Risk (08/16/2024)   Overall Financial Resource Strain (CARDIA)    Difficulty of Paying Living Expenses: Not hard at all  Food Insecurity: No Food Insecurity (08/16/2024)   Epic    Worried About Programme Researcher, Broadcasting/film/video in the Last Year: Never true    Ran Out of Food in the Last Year: Never true  Transportation Needs: No Transportation Needs (08/16/2024)   Epic    Lack of Transportation (Medical): No    Lack of Transportation (Non-Medical): No  Physical Activity: Sufficiently Active (08/16/2024)   Exercise Vital Sign    Days of Exercise per Week: 5 days    Minutes of Exercise per Session: 60 min  Stress: Stress Concern Present (08/16/2024)   [employer] of Occupational Health - Occupational Stress Questionnaire    Feeling of Stress: To some extent  Social Connections: Moderately Integrated (08/16/2024)   Social Connection and Isolation Panel    Frequency of Communication with Friends and Family: Once a week    Frequency of Social Gatherings with Friends and Family: Twice a week    Attends Religious Services: Patient declined    Database Administrator or Organizations: Yes    Attends Engineer, Structural: More than 4 times per year    Marital Status: Married  Catering Manager Violence: Not At Risk (05/28/2023)   Received from [facility]   HITS    Over the last 12 months how often did your partner physically hurt you?: Never    Over the last 12 months how often did your partner insult you or talk down to you?: Never    Over the last 12 months how often did your partner threaten you with physical harm?: Never    Over the last 12 months how often did your partner scream  or curse at you?: Never  Depression (PHQ2-9): Low Risk (03/17/2024)   Depression (PHQ2-9)    PHQ-2 Score: 1  Alcohol Screen: Low Risk (08/16/2024)   Alcohol Screen    Last Alcohol Screening Score (AUDIT): 1  Housing: Low Risk (08/16/2024)   Epic    Unable to Pay for Housing in the Last Year: No    Number of Times Moved in the Last Year: 0    Homeless in  the Last Year: No  Utilities: Not At Risk (06/04/2022)   Received from [facility] visits prior to 10/06/2022.   [facility] Utilities    Threatened with loss of utilities: No  Health Literacy: Not on file    Outpatient Medications Prior to Visit  Medication Sig Dispense Refill   allopurinol  (ZYLOPRIM ) 100 MG tablet Take 1 tablet (100 mg total) by mouth daily. 90 tablet 3   amoxicillin  (AMOXIL ) 500 MG capsule Take 4 capsules (2,000 mg total) by mouth daily as needed (dental procedures). PRN dental visits/surgerys 12 capsule 0   aspirin EC 81 MG tablet Take 81 mg by mouth daily.     blood glucose meter kit and supplies Dispense based on patient and insurance preference. Use up to rwo times daily as directed. DX: E11.9). Aviva Plus meter 1 each 0   cephALEXin  (KEFLEX ) 500 MG capsule Take 1 capsule (500 mg total) by mouth 4 (four) times daily. 28 capsule 0   flecainide (TAMBOCOR) 50 MG tablet Take 50 mg by mouth 2 (two) times daily.     fluocinonide cream (LIDEX) 0.05 % Apply 1 Application topically 2 (two) times daily as needed.     glucose blood (ACCU-CHEK AVIVA PLUS) test strip Check blood sugars twice daily 100 each 12   latanoprost (XALATAN) 0.005 % ophthalmic solution Place 1 drop into both eyes daily.  3   metFORMIN  (GLUCOPHAGE ) 500 MG tablet TAKE 1 TABLET BY MOUTH TWICE A DAY WITH FOOD 180 tablet 1   metroNIDAZOLE (METROCREAM) 0.75 % cream Apply 1 Application topically 2 (two) times daily as needed.     psyllium (METAMUCIL) 58.6 % packet Take 1 packet by mouth daily.     Pumpkin Seed-Soy Germ (AZO BLADDER CONTROL/GO-LESS  PO) Take by mouth.     rosuvastatin  (CRESTOR ) 10 MG tablet TAKE 1 TABLET BY MOUTH EVERY DAY 90 tablet 1   valsartan  (DIOVAN ) 40 MG tablet TAKE 1 TABLET BY MOUTH EVERY DAY 90 tablet 0   hydrochlorothiazide  (HYDRODIURIL ) 12.5 MG tablet Take 1 tablet (12.5 mg total) by mouth daily. 90 tablet 0   No facility-administered medications prior to visit.    Allergies[1]  Review of Systems  Constitutional:  Negative for fever and malaise/fatigue.  HENT:  Negative for congestion.   Eyes:  Negative for blurred vision.  Respiratory:  Negative for shortness of breath.   Cardiovascular:  Negative for chest pain, palpitations and leg swelling.  Gastrointestinal:  Negative for abdominal pain, blood in stool and nausea.  Genitourinary:  Negative for dysuria and frequency.  Musculoskeletal:  Positive for joint pain. Negative for falls.  Skin:  Negative for rash.  Neurological:  Negative for dizziness, loss of consciousness and headaches.  Endo/Heme/Allergies:  Negative for environmental allergies.  Psychiatric/Behavioral:  Negative for depression. The patient is nervous/anxious.        Objective:    Physical Exam Constitutional:      General: She is not in acute distress.    Appearance: Normal appearance. She is well-developed. She is not toxic-appearing.  HENT:     Head: Normocephalic and atraumatic.     Right Ear: External ear normal.     Left Ear: External ear normal.     Nose: Nose normal.  Eyes:     General:        Right eye: No discharge.        Left eye: No discharge.     Conjunctiva/sclera: Conjunctivae normal.  Neck:     Thyroid :  No thyromegaly.  Cardiovascular:     Rate and Rhythm: Normal rate and regular rhythm.     Heart sounds: Normal heart sounds. No murmur heard. Pulmonary:     Effort: Pulmonary effort is normal. No respiratory distress.     Breath sounds: Normal breath sounds.  Abdominal:     General: Bowel sounds are normal.     Palpations: Abdomen is soft.      Tenderness: There is no abdominal tenderness. There is no guarding.  Musculoskeletal:        General: Normal range of motion.     Cervical back: Neck supple.  Lymphadenopathy:     Cervical: No cervical adenopathy.  Skin:    General: Skin is warm and dry.  Neurological:     Mental Status: She is alert and oriented to person, place, and time.  Psychiatric:        Mood and Affect: Mood normal.        Behavior: Behavior normal.        Thought Content: Thought content normal.        Judgment: Judgment normal.     BP 126/68   Pulse 60   Temp 97.9 F (36.6 C)   Resp 16   Ht 5' 2 (1.575 m)   Wt 139 lb 12.8 oz (63.4 kg)   SpO2 99%   BMI 25.57 kg/m  Wt Readings from Last 3 Encounters:  08/20/24 139 lb 12.8 oz (63.4 kg)  03/17/24 139 lb (63 kg)  03/08/24 144 lb 6.4 oz (65.5 kg)    Diabetic Foot Exam - Simple   No data filed    Lab Results  Component Value Date   WBC 6.5 08/18/2024   HGB 14.7 08/18/2024   HCT 44.0 08/18/2024   PLT 174.0 08/18/2024   GLUCOSE 134 (H) 08/18/2024   CHOL 145 08/18/2024   TRIG 99.0 08/18/2024   HDL 65.70 08/18/2024   LDLDIRECT 182.4 09/11/2011   LDLCALC 60 08/18/2024   ALT 17 08/18/2024   AST 24 08/18/2024   NA 137 08/18/2024   K 4.7 08/18/2024   CL 100 08/18/2024   CREATININE 0.88 08/18/2024   BUN 18 08/18/2024   CO2 29 08/18/2024   TSH 6.00 (H) 08/18/2024   INR 0.9 09/11/2011   HGBA1C 6.8 (H) 08/18/2024   MICROALBUR <0.7 08/18/2024    Lab Results  Component Value Date   TSH 6.00 (H) 08/18/2024   Lab Results  Component Value Date   WBC 6.5 08/18/2024   HGB 14.7 08/18/2024   HCT 44.0 08/18/2024   MCV 89.6 08/18/2024   PLT 174.0 08/18/2024   Lab Results  Component Value Date   NA 137 08/18/2024   K 4.7 08/18/2024   CO2 29 08/18/2024   GLUCOSE 134 (H) 08/18/2024   BUN 18 08/18/2024   CREATININE 0.88 08/18/2024   BILITOT 0.5 08/18/2024   ALKPHOS 78 08/18/2024   AST 24 08/18/2024   ALT 17 08/18/2024   PROT 7.3  08/18/2024   ALBUMIN 4.4 08/18/2024   CALCIUM  9.8 08/18/2024   ANIONGAP 10 10/25/2023   GFR 62.54 08/18/2024   Lab Results  Component Value Date   CHOL 145 08/18/2024   Lab Results  Component Value Date   HDL 65.70 08/18/2024   Lab Results  Component Value Date   LDLCALC 60 08/18/2024   Lab Results  Component Value Date   TRIG 99.0 08/18/2024   Lab Results  Component Value Date   CHOLHDL 2 08/18/2024  Lab Results  Component Value Date   HGBA1C 6.8 (H) 08/18/2024       Assessment & Plan:  History of colon cancer Assessment & Plan: Doing well   Gout, unspecified cause, unspecified chronicity, unspecified site Assessment & Plan: Hydrate and monitor taking Allopurinol  qod   DM (diabetes mellitus), type 2 with neurological complications (HCC) Assessment & Plan: hgba1c acceptable, minimize simple carbs. Increase exercise as tolerated. Continue current meds    Essential hypertension, benign Assessment & Plan: Well controlled, no changes to meds. Encouraged heart healthy diet such as the DASH diet and exercise as tolerated. Taking HCTZ QOD  Orders: -     hydroCHLOROthiazide ; Take 1 tablet (12.5 mg total) by mouth daily.  Dispense: 90 tablet; Refill: 3  Hyperlipidemia, unspecified hyperlipidemia type Assessment & Plan: Encourage heart healthy diet such as MIND or DASH diet, increase exercise, avoid trans fats, simple carbohydrates and processed foods, consider a krill or fish or flaxseed oil cap daily. Tolerating Rosuvastatin    Multiple thyroid  nodules -     US  THYROID ; Future    Assessment and Plan Assessment & Plan Type 2 diabetes mellitus with neurological complications A1c has improved to 6.8 with minimal use of metformin , indicating good glycemic control. She is managing her diet and exercise effectively. - Continue current management with minimal metformin  use as needed based on blood glucose readings.  Multiple thyroid  nodules with elevated  TSH Elevated TSH with normal T4 levels. No prominent nodules felt on examination. Lymph nodes may be enlarged due to recent tongue procedure. Thyroid  nodules are common and most are benign, but further evaluation is warranted. - Ordered thyroid  ultrasound to evaluate nodules. - Scheduled follow-up lab work in three months to reassess TSH and T4 levels.  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 No Dupuytren's contractures observed. Discussed potential for Botox and steroid injections if symptoms worsen, noting the injections can be painful due to limited tissue in the hand. - Continue using stress balls for hand exercises. - Will consider referral to hand surgeon for Botox and steroid injections if trigger finger symptoms worsen.  Chronic constipation Managed with Senokot, which is effective. Constipation is a common issue with aging due to decreased bowel motility. - Continue Senokot as needed for constipation management.  General health maintenance Discussed the importance of lifestyle modifications to slow aging and maintain health, including regular exercise, hydration, and social engagement. Emphasized the importance of avoiding smoking and excessive alcohol consumption. - Encouraged regular exercise, hydration, and social engagement. - Advised against smoking and excessive alcohol consumption.  Recording duration: 24 minutes     Harlene Horton, MD    [1]  Allergies Allergen Reactions   Codeine Nausea And Vomiting   "

## 2024-08-19 NOTE — Assessment & Plan Note (Signed)
Well controlled, no changes to meds. Encouraged heart healthy diet such as the DASH diet and exercise as tolerated. Taking HCTZ QOD

## 2024-08-19 NOTE — Assessment & Plan Note (Signed)
 Encourage heart healthy diet such as MIND or DASH diet, increase exercise, avoid trans fats, simple carbohydrates and processed foods, consider a krill or fish or flaxseed oil cap daily. Tolerating Rosuvastatin

## 2024-08-19 NOTE — Assessment & Plan Note (Signed)
 Doing well

## 2024-08-20 ENCOUNTER — Ambulatory Visit: Admitting: Family Medicine

## 2024-08-20 ENCOUNTER — Encounter: Payer: Self-pay | Admitting: Family Medicine

## 2024-08-20 VITALS — BP 126/68 | HR 60 | Temp 97.9°F | Resp 16 | Ht 62.0 in | Wt 139.8 lb

## 2024-08-20 DIAGNOSIS — E042 Nontoxic multinodular goiter: Secondary | ICD-10-CM

## 2024-08-20 DIAGNOSIS — M109 Gout, unspecified: Secondary | ICD-10-CM | POA: Diagnosis not present

## 2024-08-20 DIAGNOSIS — E1149 Type 2 diabetes mellitus with other diabetic neurological complication: Secondary | ICD-10-CM

## 2024-08-20 DIAGNOSIS — E785 Hyperlipidemia, unspecified: Secondary | ICD-10-CM

## 2024-08-20 DIAGNOSIS — Z7984 Long term (current) use of oral hypoglycemic drugs: Secondary | ICD-10-CM | POA: Diagnosis not present

## 2024-08-20 DIAGNOSIS — I10 Essential (primary) hypertension: Secondary | ICD-10-CM | POA: Diagnosis not present

## 2024-08-20 DIAGNOSIS — Z85038 Personal history of other malignant neoplasm of large intestine: Secondary | ICD-10-CM | POA: Diagnosis not present

## 2024-08-20 MED ORDER — HYDROCHLOROTHIAZIDE 12.5 MG PO TABS: 12.5000 mg | ORAL_TABLET | Freq: Every day | ORAL | 3 refills | Status: AC

## 2024-08-20 NOTE — Progress Notes (Signed)
 Patient was advised in office.

## 2024-08-20 NOTE — Patient Instructions (Signed)
 Thyroid Nodule  A thyroid nodule is an isolated growth of thyroid cells that forms a lump in the thyroid gland. The thyroid gland is a butterfly-shaped gland found in the lower front of the neck. It sends chemical messengers (hormones) through the blood to all parts of the body. These hormones are important in regulating body temperature and helping the body use energy. Thyroid nodules are common. Most are not cancerous (are benign). You may have one nodule or several nodules. There are different types of thyroid nodules. They include nodules that: Grow and fill with fluid (thyroid cysts). Produce too much thyroid hormone (hot nodules or hyperthyroid). Produce no thyroid hormone (cold nodules or hypothyroid). Form from cancer cells (thyroid cancers). What are the causes? In most cases, the cause of thyroid nodules is not known. What increases the risk? The following factors may make you more likely to develop thyroid nodules: Age. Thyroid nodules are more common in people who are older than 45 years. Female gender. A family history that includes: Thyroid nodules. Pheochromocytoma. Thyroid carcinoma. Hyperparathyroidism. Certain thyroid diseases, such as Hashimoto's thyroiditis. Lack of iodine in your diet. A history of head and neck radiation, such as from cancer treatments. Type 2 diabetes. What are the signs or symptoms? In many cases, there are no symptoms. If you have symptoms, they may include: A lump in your lower neck. Feeling pressure, fullness, or a tickle in your throat. Pain in your neck, jaw, or ear. Having trouble swallowing or breathing. Hot nodules may cause: Weight loss. Warm, flushed skin. Feeling hot. Feeling nervous. A rapid or irregular heartbeat. Cold nodules may cause: Weight gain. Dry skin. Hair loss, brittle hair, or both. Feeling cold. Fatigue. Thyroid cancer nodules may cause: Hard nodules that can be felt along the thyroid  gland. Hoarseness. Lumps in the tissue (lymph nodes) near your thyroid gland. How is this diagnosed? A thyroid nodule may be felt by your health care provider during a physical exam. This condition may also be diagnosed based on your symptoms. You may also have tests, including: Blood tests to check how well your thyroid is working. An ultrasound. This may be done to confirm the diagnosis. A biopsy. This involves taking a sample from the nodule and looking at it under a microscope. A thyroid scan. This test creates an image of the thyroid gland using a radioactive tracer. Imaging tests such as an MRI or CT scan. These may be done if: A nodule is large. A nodule is blocking your airway. Cancer is suspected. How is this treated? Treatment depends on the cause and size of your nodule or nodules. If a nodule is benign, treatment may not be necessary. Your health care provider may monitor the nodule to see if it goes away without treatment. If a nodule continues to grow, is cancerous, or does not go away, treatment may be needed. Treatment may include: Having a cystic nodule drained with a needle. Ablation therapy. In this treatment, alcohol is injected into the area of the nodule to destroy the cells. Ablation with heat may also be used. This is called thermal ablation. Radioactive iodine. In this treatment, radioactive iodine is given as a pill or liquid that you drink. This substance causes the thyroid nodule to shrink. Surgery to remove the nodule or nodules. Part or all of your thyroid gland may also need to be removed. Medicines to treat hyperthyroidism. Follow these instructions at home: Pay attention to any changes in your thyroid nodule or nodules. Take over-the-counter  and prescription medicines only as told by your health care provider. Keep all follow-up visits. This is important. Contact a health care provider if: You have trouble sleeping. You have muscle weakness. You have  significant weight loss without changing your eating habits. You feel nervous. You have trouble swallowing. You have increased swelling. You have a rapid or irregular heartbeat. Get help right away if: You have chest pain. You faint or lose consciousness. Your nodule makes it hard for you to breathe. These symptoms may be an emergency. Get help right away. Call 911. Do not wait to see if the symptoms will go away. Do not drive yourself to the hospital. Summary A thyroid nodule is an isolated growth of thyroid cells that forms a lump in your thyroid gland. Thyroid nodules are common. Most are not cancerous. Your health care provider may monitor the nodule to see if it goes away without treatment. If a nodule continues to grow, is cancerous, or does not go away, treatment may be needed. Treatment depends on the cause and size of your nodule or nodules. This information is not intended to replace advice given to you by your health care provider. Make sure you discuss any questions you have with your health care provider. Document Revised: 06/05/2021 Document Reviewed: 06/05/2021 Elsevier Patient Education  2024 [employer].

## 2024-08-25 ENCOUNTER — Ambulatory Visit (HOSPITAL_BASED_OUTPATIENT_CLINIC_OR_DEPARTMENT_OTHER)
Admission: RE | Admit: 2024-08-25 | Discharge: 2024-08-25 | Disposition: A | Discharge status: Home / Self Care | Source: Ambulatory Visit | Attending: Family Medicine | Admitting: Family Medicine

## 2024-08-25 DIAGNOSIS — E042 Nontoxic multinodular goiter: Secondary | ICD-10-CM | POA: Insufficient documentation

## 2024-08-26 ENCOUNTER — Ambulatory Visit: Payer: Self-pay | Admitting: Family Medicine

## 2024-08-27 ENCOUNTER — Ambulatory Visit (HOSPITAL_BASED_OUTPATIENT_CLINIC_OR_DEPARTMENT_OTHER)

## 2024-08-27 NOTE — Progress Notes (Signed)
 Patient reviewed via MyChart.

## 2024-08-31 ENCOUNTER — Ambulatory Visit (HOSPITAL_BASED_OUTPATIENT_CLINIC_OR_DEPARTMENT_OTHER)

## 2024-09-04 ENCOUNTER — Ambulatory Visit

## 2024-09-04 VITALS — BP 126/68 | Ht 62.0 in | Wt 135.0 lb

## 2024-09-04 DIAGNOSIS — Z0000 Encounter for general adult medical examination without abnormal findings: Secondary | ICD-10-CM

## 2024-09-04 NOTE — Patient Instructions (Signed)
 Andrea Calderon,  Thank you for taking the time for your Medicare Wellness Visit. I appreciate your continued commitment to your health goals. Please review the care plan we discussed, and feel free to reach out if I can assist you further.  Please note that Annual Wellness Visits do not include a physical exam. Some assessments may be limited, especially if the visit was conducted virtually. If needed, we may recommend an in-person follow-up with your provider.  Ongoing Care Seeing your primary care provider every 3 to 6 months helps us  monitor your health and provide consistent, personalized care.   Referrals If a referral was made during today's visit and you haven't received any updates within two weeks, please contact the referred provider directly to check on the status.  Recommended Screenings:  Health Maintenance  Topic Date Due   Medicare Annual Wellness Visit  11/20/2017   DTaP/Tdap/Td vaccine (2 - Td or Tdap) 09/10/2021   Complete foot exam   05/23/2023   Eye exam for diabetics  04/16/2024   COVID-19 Vaccine (8 - Moderna risk 2025-26 season) 02/02/2025   Hemoglobin A1C  02/15/2025   Yearly kidney function blood test for diabetes  08/18/2025   Kidney health urinalysis for diabetes  08/18/2025   Pneumococcal Vaccine for age over 48  Completed   Flu Shot  Completed   Osteoporosis screening with Bone Density Scan  Completed   Hepatitis C Screening  Completed   Zoster (Shingles) Vaccine  Completed   Meningitis B Vaccine  Aged Out   Breast Cancer Screening  Discontinued   Colon Cancer Screening  Discontinued       09/04/2024    9:44 AM  Advanced Directives  Does Patient Have a Medical Advance Directive? Yes  Type of Estate Agent of [place];Living will;Out of facility DNR (pink MOST or yellow form)  Does patient want to make changes to medical advance directive? No - Patient declined  Copy of Healthcare Power of Attorney in Chart? No - copy requested     Vision: Annual vision screenings are recommended for early detection of glaucoma, cataracts, and diabetic retinopathy. These exams can also reveal signs of chronic conditions such as diabetes and high blood pressure.  Dental: Annual dental screenings help detect early signs of oral cancer, gum disease, and other conditions linked to overall health, including heart disease and diabetes.  Please see the attached documents for additional preventive care recommendations.

## 2024-09-04 NOTE — Progress Notes (Signed)
 " I connected with  Andrea Calderon on 09/04/24 by a audio enabled telemedicine application and verified that I am speaking with the correct person using two identifiers.  Patient Location: Home  Provider Location: Home Office  Persons Participating in Visit: Patient.  I discussed the limitations of evaluation and management by telemedicine. The patient expressed understanding and agreed to proceed.  Vital Signs: Because this visit was a virtual/telehealth visit, some criteria may be missing or patient reported. Any vitals not documented were not able to be obtained and vitals that have been documented are patient reported.  Chief Complaint  Patient presents with   Medicare Wellness     Subjective:   Andrea Calderon is a 79 y.o. female who presents for a Medicare Annual Wellness Visit.  Visit info / Clinical Intake: Medicare Wellness Visit Type:: Subsequent Annual Wellness Visit Persons participating in visit and providing information:: patient Medicare Wellness Visit Mode:: Telephone If telephone:: video declined Since this visit was completed virtually, some vitals may be partially provided or unavailable. Missing vitals are due to the limitations of the virtual format.: Documented vitals are patient reported If Telephone or Video please confirm:: I connected with patient using audio/video enable telemedicine. I verified patient identity with two identifiers, discussed telehealth limitations, and patient agreed to proceed. Patient Location:: home Provider Location:: home office Interpreter Needed?: No Pre-visit prep was completed: yes AWV questionnaire completed by patient prior to visit?: no Living arrangements:: lives with spouse/significant other Patient's Overall Health Status Rating: very good Typical amount of pain: none Does pain affect daily life?: no Are you currently prescribed opioids?: no  Dietary Habits and Nutritional Risks How many meals a day?: 3 Eats fruit and  vegetables daily?: yes Most meals are obtained by: preparing own meals In the last 2 weeks, have you had any of the following?: none Diabetic:: (!) yes Any non-healing wounds?: no How often do you check your BS?: 1 Would you like to be referred to a Nutritionist or for Diabetic Management? : no  Functional Status Activities of Daily Living (to include ambulation/medication): Independent Ambulation: Independent Medication Administration: Independent Home Management (perform basic housework or laundry): Independent Manage your own finances?: yes Primary transportation is: driving Concerns about vision?: no *vision screening is required for WTM* Concerns about hearing?: no  Fall Screening Falls in the past year?: 0 Number of falls in past year: 0 Was there an injury with Fall?: 0 Fall Risk Category Calculator: 0 Patient Fall Risk Level: Low Fall Risk  Fall Risk Patient at Risk for Falls Due to: No Fall Risks Fall risk Follow up: Falls evaluation completed  Home and Transportation Safety: All rugs have non-skid backing?: N/A, no rugs All stairs or steps have railings?: N/A, no stairs Grab bars in the bathtub or shower?: yes Have non-skid surface in bathtub or shower?: yes Good home lighting?: yes Regular seat belt use?: yes Hospital stays in the last year:: no  Cognitive Assessment Difficulty concentrating, remembering, or making decisions? : no Will 6CIT or Mini Cog be Completed: yes What year is it?: 0 points What month is it?: 0 points Give patient an address phrase to remember (5 components): [address] [place] About what time is it?: 0 points Count backwards from 20 to 1: 0 points Say the months of the year in reverse: 0 points Repeat the address phrase from earlier: 0 points 6 CIT Score: 0 points  Advance Directives (For Healthcare) Does Patient Have a Medical Advance Directive?: Yes  Does patient want to make changes to medical advance directive?: No -  Patient declined Type of Advance Directive: Healthcare Power of [place]; Living will; Out of facility DNR (pink MOST or yellow form) Copy of Healthcare Power of Attorney in Chart?: No - copy requested Copy of Living Will in Chart?: No - copy requested Out of facility DNR (pink MOST or yellow form) in Chart? (Ambulatory ONLY): No - copy requested  Reviewed/Updated  Reviewed/Updated: Reviewed All (Medical, Surgical, Family, Medications, Allergies, Care Teams, Patient Goals)    Allergies (verified) Codeine   Current Medications (verified) Outpatient Encounter Medications as of 09/04/2024  Medication Sig   allopurinol  (ZYLOPRIM ) 100 MG tablet Take 1 tablet (100 mg total) by mouth daily.   amoxicillin  (AMOXIL ) 500 MG capsule Take 4 capsules (2,000 mg total) by mouth daily as needed (dental procedures). PRN dental visits/surgerys   aspirin EC 81 MG tablet Take 81 mg by mouth daily.   blood glucose meter kit and supplies Dispense based on patient and insurance preference. Use up to rwo times daily as directed. DX: E11.9). Aviva Plus meter   cephALEXin  (KEFLEX ) 500 MG capsule Take 1 capsule (500 mg total) by mouth 4 (four) times daily.   flecainide (TAMBOCOR) 50 MG tablet Take 50 mg by mouth 2 (two) times daily.   fluocinonide cream (LIDEX) 0.05 % Apply 1 Application topically 2 (two) times daily as needed.   glucose blood (ACCU-CHEK AVIVA PLUS) test strip Check blood sugars twice daily   hydrochlorothiazide  (HYDRODIURIL ) 12.5 MG tablet Take 1 tablet (12.5 mg total) by mouth daily.   latanoprost (XALATAN) 0.005 % ophthalmic solution Place 1 drop into both eyes daily.   metFORMIN  (GLUCOPHAGE ) 500 MG tablet TAKE 1 TABLET BY MOUTH TWICE A DAY WITH FOOD   metroNIDAZOLE (METROCREAM) 0.75 % cream Apply 1 Application topically 2 (two) times daily as needed.   psyllium (METAMUCIL) 58.6 % packet Take 1 packet by mouth daily.   Pumpkin Seed-Soy Germ (AZO BLADDER CONTROL/GO-LESS PO) Take by mouth.    rosuvastatin  (CRESTOR ) 10 MG tablet TAKE 1 TABLET BY MOUTH EVERY DAY   valsartan  (DIOVAN ) 40 MG tablet TAKE 1 TABLET BY MOUTH EVERY DAY   No facility-administered encounter medications on file as of 09/04/2024.    History: Past Medical History:  Diagnosis Date   Allergy coedine   BCC (basal cell carcinoma), face 12/05/2014   Removed from right side of nose [facility] Lorie and Dr Zulema   Cancer [facility])    [facility] right nose   Diabetes mellitus    H/O: stroke 09/11/2011   September 2012 acute right retinal artery occlusion H/o left retinal artery embolic event by imaging H/o blood clot in port during chemotherapy    Heart murmur    Multiple thyroid  nodules 88/91/7986   Oral lichen planus 09/11/2011   Other and unspecified hyperlipidemia 12/16/2012   Preventative health care 06/21/2013   Rosacea    Valvular heart disease 06/13/2012   Cardiologist Dr Collier of [facility]   Past Surgical History:  Procedure Laterality Date   ABDOMINAL HYSTERECTOMY  08/07/2007   total   CARDIAC VALVE REPLACEMENT     COLECTOMY  08/07/2007   HERNIA REPAIR     MOHS SURGERY Right    for [facility] on right nose, Feb 2016   PACEMAKER PLACEMENT Left    Valve replacement 9/22   TONGUE BIOPSY  01/05/2015   negative results   VENTRAL HERNIA REPAIR  06/06/2010   4 lesions found, mesh left in place   Family History  Problem Relation Age of Onset   Transient ischemic attack Mother    Hypertension Mother    Dementia Mother    Cancer Mother    Obesity Mother    Diabetes Father        borderline sugar   Coronary artery disease Father    Heart disease Maternal Grandfather    Heart disease Paternal Grandfather    Social History   Occupational History   Not on file  Tobacco Use   Smoking status: Never   Smokeless tobacco: Never  Vaping Use   Vaping status: Never Used  Substance and Sexual Activity   Alcohol use: Not Currently    Comment: rare,  social   Drug use: No   Sexual activity: Yes   Tobacco Counseling Counseling given: Not Answered  SDOH Screenings   Food Insecurity: No Food Insecurity (09/04/2024)  Housing: Low Risk (09/04/2024)  Transportation Needs: No Transportation Needs (09/04/2024)  Utilities: Not At Risk (09/04/2024)  Alcohol Screen: Low Risk (08/16/2024)  Depression (PHQ2-9): Low Risk (09/04/2024)  Financial Resource Strain: Low Risk (08/16/2024)  Physical Activity: Sufficiently Active (09/04/2024)  Social Connections: Moderately Integrated (09/04/2024)  Stress: No Stress Concern Present (09/04/2024)  Recent Concern: Stress - Stress Concern Present (08/16/2024)  Tobacco Use: Low Risk (09/04/2024)  Health Literacy: Adequate Health Literacy (09/04/2024)   See flowsheets for full screening details  Depression Screen PHQ 2 & 9 Depression Scale- Over the past 2 weeks, how often have you been bothered by any of the following problems? Little interest or pleasure in doing things: 0 Feeling down, depressed, or hopeless (PHQ Adolescent also includes...irritable): 0 PHQ-2 Total Score: 0 Trouble falling or staying asleep, or sleeping too much: 0 Feeling tired or having little energy: 0 Poor appetite or overeating (PHQ Adolescent also includes...weight loss): 0 Feeling bad about yourself - or that you are a failure or have let yourself or your family down: 0 Trouble concentrating on things, such as reading the newspaper or watching television (PHQ Adolescent also includes...like school work): 0 Moving or speaking so slowly that other people could have noticed. Or the opposite - being so fidgety or restless that you have been moving around a lot more than usual: 0 Thoughts that you would be better off dead, or of hurting yourself in some way: 0 PHQ-9 Total Score: 0 If you checked off any problems, how difficult have these problems made it for you to do your work, take care of things at home, or get along with other people?: Not  difficult at all  Depression Treatment Depression Interventions/Treatment : EYV7-0 Score <4 Follow-up Not Indicated     Goals Addressed               This Visit's Progress     to continue helping husband (pt-stated)               Objective:    Today's Vitals   09/04/24 0940  BP: 126/68  Weight: 135 lb (61.2 kg)  Height: 5' 2 (1.575 m)   Body mass index is 24.69 kg/m.  Hearing/Vision screen Hearing Screening - Comments:: No difficulties  Vision Screening - Comments:: Patient wears glasses. [facility]  Immunizations and Health Maintenance Health Maintenance  Topic Date Due   Medicare Annual Wellness (AWV)  11/20/2017   DTaP/Tdap/Td (2 - Td or Tdap) 09/10/2021   FOOT EXAM  05/23/2023   OPHTHALMOLOGY EXAM  04/16/2024   COVID-19 Vaccine (8 - Moderna risk 2025-26 season) 02/02/2025   HEMOGLOBIN A1C  02/15/2025   Diabetic kidney evaluation - eGFR measurement  08/18/2025   Diabetic kidney evaluation - Urine ACR  08/18/2025   Pneumococcal Vaccine: 50+ Years  Completed   Influenza Vaccine  Completed   Bone Density Scan  Completed   Hepatitis C Screening  Completed   Zoster Vaccines- Shingrix   Completed   Meningococcal B Vaccine  Aged Out   Mammogram  Discontinued   Colonoscopy  Discontinued        Assessment/Plan:  This is a routine wellness examination for Andrea Calderon.  Patient Care Team: Domenica Harlene LABOR, MD as PCP - General (Family Medicine) Burnard Ozell Ny, MD as Referring Physician (Gynecologic Oncology) Bloomfeld, Charlie Radford, MD as Referring Physician (Gastroenterology) Wava Bennie LABOR., MD as Referring Physician (Cardiology) Gennie Dover, MD as Referring Physician (Ophthalmology) Lorie Redell RAMAN, MD as Consulting Physician (Dermatology)  I have personally reviewed and noted the following in the patients chart:   Medical and social history Use of alcohol, tobacco or illicit drugs  Current medications and supplements including  opioid prescriptions. Functional ability and status Nutritional status Physical activity Advanced directives List of other physicians Hospitalizations, surgeries, and ER visits in previous 12 months Vitals Screenings to include cognitive, depression, and falls Referrals and appointments  No orders of the defined types were placed in this encounter.  In addition, I have reviewed and discussed with patient certain preventive protocols, quality metrics, and best practice recommendations. A written personalized care plan for preventive services as well as general preventive health recommendations were provided to patient.   Lyle MARLA Right, [place]   09/04/2024   No follow-ups on file.  After Visit Summary: (MyChart) Due to this being a telephonic visit, the after visit summary with patients personalized plan was offered to patient via MyChart  No voiced or noted concerns at this time Patient advised to keep follow-up appointment with PCP (01/28/2025) Vaccines not given: Tdap vaccination declined today Screenings not ordered: Declined Referral/Order for eye exam patient is scheduled for February   "

## 2025-01-28 ENCOUNTER — Ambulatory Visit: Admitting: Family Medicine
